# Patient Record
Sex: Female | Born: 1975
Health system: Southern US, Community
[De-identification: ages and names within clinical notes are randomized; demographics above are authoritative.]

## PROBLEM LIST (undated history)

## (undated) DIAGNOSIS — N951 Menopausal and female climacteric states: Secondary | ICD-10-CM

## (undated) DIAGNOSIS — F419 Anxiety disorder, unspecified: Secondary | ICD-10-CM

## (undated) DIAGNOSIS — F329 Major depressive disorder, single episode, unspecified: Secondary | ICD-10-CM

## (undated) DIAGNOSIS — T7840XA Allergy, unspecified, initial encounter: Secondary | ICD-10-CM

## (undated) DIAGNOSIS — E559 Vitamin D deficiency, unspecified: Secondary | ICD-10-CM

## (undated) DIAGNOSIS — F32A Depression, unspecified: Secondary | ICD-10-CM

## (undated) DIAGNOSIS — N921 Excessive and frequent menstruation with irregular cycle: Secondary | ICD-10-CM

## (undated) HISTORY — DX: Anxiety disorder, unspecified: F41.9

## (undated) HISTORY — DX: Depression, unspecified: F32.A

## (undated) HISTORY — DX: Major depressive disorder, single episode, unspecified: F32.9

## (undated) HISTORY — DX: Morbid (severe) obesity due to excess calories: E66.01

## (undated) HISTORY — PX: BRAIN SURGERY: SHX531

## (undated) HISTORY — DX: Vitamin D deficiency, unspecified: E55.9

## (undated) HISTORY — DX: Allergy, unspecified, initial encounter: T78.40XA

## (undated) HISTORY — DX: Excessive and frequent menstruation with irregular cycle: N92.1

## (undated) HISTORY — DX: Menopausal and female climacteric states: N95.1

---

## 2005-06-12 ENCOUNTER — Emergency Department: Payer: Self-pay | Admitting: Emergency Medicine

## 2006-08-26 ENCOUNTER — Other Ambulatory Visit: Admission: RE | Admit: 2006-08-26 | Discharge: 2006-08-26 | Payer: Self-pay | Admitting: Family Medicine

## 2007-08-27 ENCOUNTER — Other Ambulatory Visit: Admission: RE | Admit: 2007-08-27 | Discharge: 2007-08-27 | Payer: Self-pay | Admitting: Family Medicine

## 2008-11-15 ENCOUNTER — Other Ambulatory Visit: Admission: RE | Admit: 2008-11-15 | Discharge: 2008-11-15 | Payer: Self-pay | Admitting: Obstetrics and Gynecology

## 2009-06-04 ENCOUNTER — Inpatient Hospital Stay (HOSPITAL_COMMUNITY): Admission: AD | Admit: 2009-06-04 | Discharge: 2009-06-06 | Payer: Self-pay | Admitting: Obstetrics and Gynecology

## 2009-11-23 ENCOUNTER — Other Ambulatory Visit: Admission: RE | Admit: 2009-11-23 | Discharge: 2009-11-23 | Payer: Self-pay | Admitting: Obstetrics and Gynecology

## 2010-06-01 ENCOUNTER — Emergency Department: Payer: Self-pay | Admitting: Internal Medicine

## 2010-11-21 ENCOUNTER — Other Ambulatory Visit
Admission: RE | Admit: 2010-11-21 | Discharge: 2010-11-21 | Payer: Self-pay | Source: Home / Self Care | Admitting: Obstetrics and Gynecology

## 2011-03-31 LAB — CBC
HCT: 38.4 % (ref 36.0–46.0)
Hemoglobin: 12.1 g/dL (ref 12.0–15.0)
MCHC: 34.5 g/dL (ref 30.0–36.0)
MCV: 83.6 fL (ref 78.0–100.0)
Platelets: 248 10*3/uL (ref 150–400)
Platelets: 268 10*3/uL (ref 150–400)
RDW: 15.2 % (ref 11.5–15.5)
RDW: 15.8 % — ABNORMAL HIGH (ref 11.5–15.5)
WBC: 10.6 10*3/uL — ABNORMAL HIGH (ref 4.0–10.5)

## 2011-03-31 LAB — RPR: RPR Ser Ql: NONREACTIVE

## 2011-03-31 LAB — CCBB MATERNAL DONOR DRAW

## 2012-09-10 ENCOUNTER — Other Ambulatory Visit (HOSPITAL_COMMUNITY)
Admission: RE | Admit: 2012-09-10 | Discharge: 2012-09-10 | Disposition: A | Discharge status: Home / Self Care | Payer: 59 | Source: Ambulatory Visit | Attending: Family Medicine | Admitting: Family Medicine

## 2012-09-10 ENCOUNTER — Other Ambulatory Visit: Payer: Self-pay | Admitting: Physician Assistant

## 2012-09-10 DIAGNOSIS — Z124 Encounter for screening for malignant neoplasm of cervix: Secondary | ICD-10-CM | POA: Insufficient documentation

## 2012-11-21 LAB — HM PAP SMEAR: HM PAP: NORMAL

## 2014-01-23 LAB — HEMOGLOBIN A1C: Hgb A1c MFr Bld: 5.8 % (ref 4.0–6.0)

## 2014-01-23 LAB — LIPID PANEL
Cholesterol: 208 mg/dL — AB (ref 0–200)
HDL: 78 mg/dL — AB (ref 35–70)
LDL CALC: 114 mg/dL
Triglycerides: 81 mg/dL (ref 40–160)

## 2014-03-21 ENCOUNTER — Ambulatory Visit: Payer: Self-pay | Admitting: Family Medicine

## 2014-03-22 ENCOUNTER — Ambulatory Visit: Payer: Self-pay | Admitting: Family Medicine

## 2015-05-31 ENCOUNTER — Telehealth: Payer: Self-pay | Admitting: Family Medicine

## 2015-05-31 ENCOUNTER — Other Ambulatory Visit: Payer: Self-pay

## 2015-05-31 DIAGNOSIS — F419 Anxiety disorder, unspecified: Principal | ICD-10-CM

## 2015-05-31 DIAGNOSIS — F32A Depression, unspecified: Secondary | ICD-10-CM

## 2015-05-31 DIAGNOSIS — F329 Major depressive disorder, single episode, unspecified: Secondary | ICD-10-CM

## 2015-05-31 MED ORDER — BUPROPION HCL ER (XL) 300 MG PO TB24: 300.0000 mg | ORAL_TABLET | Freq: Every day | ORAL | Status: DC

## 2015-05-31 NOTE — Telephone Encounter (Signed)
Patient stated that she called last week requesting a medication refill since she was getting low, but she never heard from anyone and now she is completely out.

## 2015-06-01 NOTE — Telephone Encounter (Signed)
OPENED IN ERROR

## 2015-07-16 ENCOUNTER — Encounter: Payer: Self-pay | Admitting: Family Medicine

## 2015-07-16 ENCOUNTER — Ambulatory Visit (INDEPENDENT_AMBULATORY_CARE_PROVIDER_SITE_OTHER): Payer: 59 | Admitting: Family Medicine

## 2015-07-16 ENCOUNTER — Encounter (INDEPENDENT_AMBULATORY_CARE_PROVIDER_SITE_OTHER): Payer: Self-pay

## 2015-07-16 VITALS — BP 138/76 | HR 102 | Temp 98.0°F | Resp 18 | Ht 69.0 in | Wt 272.2 lb

## 2015-07-16 DIAGNOSIS — E66811 Obesity, class 1: Secondary | ICD-10-CM | POA: Insufficient documentation

## 2015-07-16 DIAGNOSIS — E559 Vitamin D deficiency, unspecified: Secondary | ICD-10-CM

## 2015-07-16 DIAGNOSIS — F3340 Major depressive disorder, recurrent, in remission, unspecified: Secondary | ICD-10-CM

## 2015-07-16 DIAGNOSIS — Z3041 Encounter for surveillance of contraceptive pills: Secondary | ICD-10-CM

## 2015-07-16 DIAGNOSIS — Z1239 Encounter for other screening for malignant neoplasm of breast: Secondary | ICD-10-CM

## 2015-07-16 DIAGNOSIS — E66813 Obesity, class 3: Secondary | ICD-10-CM

## 2015-07-16 DIAGNOSIS — Z0000 Encounter for general adult medical examination without abnormal findings: Secondary | ICD-10-CM | POA: Insufficient documentation

## 2015-07-16 DIAGNOSIS — E669 Obesity, unspecified: Secondary | ICD-10-CM | POA: Insufficient documentation

## 2015-07-16 HISTORY — DX: Morbid (severe) obesity due to excess calories: E66.01

## 2015-07-16 HISTORY — DX: Obesity, class 3: E66.813

## 2015-07-16 MED ORDER — BUPROPION HCL ER (XL) 300 MG PO TB24: 300.0000 mg | ORAL_TABLET | Freq: Every day | ORAL | Status: DC

## 2015-07-16 MED ORDER — FLUOXETINE HCL 20 MG PO TABS: 20.0000 mg | ORAL_TABLET | Freq: Every day | ORAL | Status: DC

## 2015-07-16 NOTE — Patient Instructions (Signed)
Menopause and Herbal Products Menopause is the normal time of life when menstrual periods stop completely. Menopause is complete when you have missed 12 consecutive menstrual periods. It usually occurs between the ages of 40 to 85, with an average age of 31. Very rarely does a woman develop menopause before 40 years old. At menopause, your ovaries stop producing the female hormones, estrogen and progesterone. This can cause undesirable symptoms and also affect your health. Sometimes the symptoms can occur 4 to 5 years before the menopause begins. There is no relationship between menopause and:  Oral contraceptives.  Number of children you had.  Race.  The age your menstrual periods started (menarche). Heavy smokers and very thin women may develop menopause earlier in life. Estrogen and progesterone hormone treatment is the usual method of treating menopausal symptoms. However, there are women who should not take hormone treatment. This is true of:   Women that have breast or uterine cancer.  Women who prefer not to take hormones because of certain side effects (abnormal uterine bleeding).  Women who are afraid that hormones may cause breast cancer.  Women who have a history of liver disease, heart disease, stroke, or blood clots. For these women, there are other medications that may help treat their menopausal symptoms. These medications are found in plants and botanical products. They can be found in the form of herbs, teas, oils, tinctures, and pills.  CAUSES:  The ovaries stop producing the female hormones estrogen and progesterone.  Other causes include:  Surgery to remove both ovaries.  The ovaries stop functioning for no know reason.  Tumors of the pituitary gland in the brain.  Medical disease that affects the ovaries and hormone production.  Radiation treatment to the abdomen or pelvis.  Chemotherapy that affects the ovaries. PHYTOESTROGENS: Phytoestrogens occur  naturally in plants and plant products. They act like estrogen in the body. Herbal medications are made from these plants and botanical steroids. There are 3 types of phytoestrogens:  Isoflavones (genistein and daidzein) are found in soy, garbanzo beans, miso and tofu foods.  Ligins are found in the shell of seeds. They are used to make oils like flaxseed oil. The bacteria in your intestine act on these foods to produce the estrogen-like hormones.  Coumestans are estrogen-like. Some of the foods they are found in include sunflower seeds and bean sprouts. CONDITIONS AND THEIR POSSIBLE HERBAL TREATMENT:  Hot flashes and night sweats.  Soy, black cohosh and evening primrose.  Irritability, insomnia, depression and memory problems.  Chasteberry, ginseng, and soy.  St. John's wort may be helpful for depression. However, there is a concern of it causing cataracts of the eye and may have bad effects on other medications. St. John's wort should not be taken for long time and without your caregiver's advice.  Loss of libido and vaginal and skin dryness.  Wild yam and soy.  Prevention of coronary heart disease and osteoporosis.  Soy and Isoflavones. Several studies have shown that some women benefit from herbal medications, but most of the studies have not consistently shown that these supplements are much better than placebo. Other forms of treatment to help women with menopausal symptoms include a balanced diet, rest, exercise, vitamin and calcium (with vitamin D) supplements, acupuncture, and group therapy when necessary. THOSE WHO SHOULD NOT TAKE HERBAL MEDICATIONS INCLUDE:  Women who are planning on getting pregnant unless told by your caregiver.  Women who are breastfeeding unless told by your caregiver.  Women who are taking other  prescription medications unless told by your caregiver.  Infants, children, and elderly women unless told by your caregiver. Different herbal medications  have different and unmeasured amounts of the herbal ingredients. There are no regulations, quality control, and standardization of the ingredients in herbal medications. Therefore, the amount of the ingredient in the medication may vary from one herb, pill, tea, oil or tincture to another. Many herbal medications can cause serious problems and can even have poisonous effects if taken too much or too long. If problems develop, the medication should be stopped and recorded by your caregiver. HOME CARE INSTRUCTIONS  Do not take or give children herbal medications without your caregiver's advice.  Let your caregiver know all the medications you are taking. This includes prescription, over-the-counter, eye drops, and creams.  Do not take herbal medications longer or more than recommended.  Tell your caregiver about any side effects from the medication. SEEK MEDICAL CARE IF:  You develop a fever of 102 F (38.9 C), or as directed by your caregiver.  You feel sick to your stomach (nauseous), vomit, or have diarrhea.  You develop a rash.  You develop abdominal pain.  You develop severe headaches.  You start to have vision problems.  You feel dizzy or faint.  You start to feel numbness in any part of your body.  You start shaking (have convulsions). Document Released: 05/26/2008 Document Revised: 11/24/2012 Document Reviewed: 12/24/2010 [facility] Patient Information 2015 [place], [place]. This information is not intended to replace advice given to you by your health care provider. Make sure you discuss any questions you have with your health care provider.

## 2015-07-16 NOTE — Progress Notes (Signed)
Name: Natalie Petersen   MRN: [id number]    DOB: 1976/05/24   Date:07/16/2015       Progress Note  Subjective  Chief Complaint  Chief Complaint  Patient presents with  . Annual Exam    HPI  Patient is here today for a Complete Female Physical Exam:  The patient has has no unusual complaints and complains of worsening irritability, hot flashes, spotting menses. When she first started Wellbutrin she felt energized, even tempered and elevated mood but now after 5 years on that medication she feels as if her symptoms are not well controled. She does not mind the intermenstrual spotting but would like to control her snappy mood better. Overall feels healthy. Diet is well balanced. In general does not exercise regularly. Sees dentist regularly and addresses vision concerns with ophthalmologist if applicable. In regards to sexual activity the patient is currently sexually active. Currently is not concerned about exposure to any STDs.   Menstrual history is irregular periods with spotting in between.    Past Medical History  Diagnosis Date  . Allergy     seasonal  . Depression   . Vitamin D deficiency   . Menopausal symptoms   . Metrorrhagia     Past Surgical History  Procedure Laterality Date  . Brain surgery      Family History  Problem Relation Age of Onset  . Heart murmur Mother   . Arthritis Father     RA  . Hyperlipidemia Father   . Cancer Maternal Aunt     ovarian and uterine   . Stroke Paternal Uncle   . Arthritis Maternal Grandmother   . Heart attack Maternal Grandfather   . Stroke Paternal Grandmother   . Arthritis Paternal Grandmother     History   Social History  . Marital Status: Married    Spouse Name: N/A  . Number of Children: N/A  . Years of Education: N/A   Occupational History  . Not on file.   Social History Main Topics  . Smoking status: Never Smoker   . Smokeless tobacco: Never Used  . Alcohol Use: 0.0 oz/week    0 Standard drinks or  equivalent per week     Comment: occasionally red wine  . Drug Use: No  . Sexual Activity:    Partners: Male   Other Topics Concern  . Not on file   Social History Narrative  . No narrative on file     Current outpatient prescriptions:  .  buPROPion (WELLBUTRIN XL) 300 MG 24 hr tablet, Take 1 tablet (300 mg total) by mouth daily., Disp: 30 tablet, Rfl: 0 .  CYCLAFEM 1/35 tablet, Take 1 tablet by mouth daily., Disp: , Rfl:   Allergies  Allergen Reactions  . Amoxicillin Hives and Swelling  . Benadryl [Diphenhydramine Hcl (Sleep)] Hives and Swelling    ROS  CONSTITUTIONAL: No significant weight changes, fever, chills, weakness or fatigue.  HEENT:  - Eyes: No visual changes.  - Ears: No auditory changes. No pain.  - Nose: No sneezing, congestion, runny nose. - Throat: No sore throat. No changes in swallowing. SKIN: No rash or itching.  CARDIOVASCULAR: No chest pain, chest pressure or chest discomfort. No palpitations or edema.  RESPIRATORY: No shortness of breath, cough or sputum.  GASTROINTESTINAL: No anorexia, nausea, vomiting. No changes in bowel habits. No abdominal pain or blood.  GENITOURINARY: No dysuria. No frequency. No discharge.  NEUROLOGICAL: No headache, dizziness, syncope, paralysis, ataxia, numbness or tingling in the  extremities. No memory changes. No change in bowel or bladder control.  MUSCULOSKELETAL: No joint pain. No muscle pain. HEMATOLOGIC: No anemia, bleeding or bruising.  LYMPHATICS: No enlarged lymph nodes.  PSYCHIATRIC: Yes change in mood. No change in sleep pattern.  ENDOCRINOLOGIC: No reports of sweating, cold or heat intolerance. No polyuria or polydipsia.   Objective  Filed Vitals:   07/16/15 0835  BP: 138/76  Pulse: 102  Temp: 98 F (36.7 C)  TempSrc: Oral  Resp: 18  Height: 5' 9"  (1.753 m)  Weight: 272 lb 3.2 oz (123.469 kg)  SpO2: 96%   Body mass index is 40.18 kg/(m^2).  Depression screen PHQ 2/9 07/16/2015  Decreased Interest  0  Down, Depressed, Hopeless 0  PHQ - 2 Score 0     Physical Exam  Constitutional: Patient appears well-developed and well-nourished. In no distress.  HEENT:  - Head: Normocephalic and atraumatic.  - Ears: Bilateral TMs gray, no erythema or effusion - Nose: Nasal mucosa moist - Mouth/Throat: Oropharynx is clear and moist. No tonsillar hypertrophy or erythema. No post nasal drainage.  - Eyes: Conjunctivae clear, EOM movements normal. PERRLA. No scleral icterus.  Neck: Normal range of motion. Neck supple. No JVD present. No thyromegaly present.  Cardiovascular: Normal rate, regular rhythm and normal heart sounds.  No murmur heard.  Pulmonary/Chest: Effort normal and breath sounds normal. No respiratory distress. Abdominal: Soft. Bowel sounds are normal, no distension. There is no tenderness. no masses BREAST: Bilateral breast exam large pendulous breast tissue normal with no masses, skin changes or nipple discharge FEMALE GENITALIA: Defered RECTAL: Defered Musculoskeletal: Normal range of motion bilateral UE and LE, no joint effusions. Peripheral vascular: Bilateral LE no edema. Neurological: CN II-XII grossly intact with no focal deficits. Alert and oriented to person, place, and time. Coordination, balance, strength, speech and gait are normal.  Skin: Skin is warm and dry. No rash noted. No erythema.  Psychiatric: Patient has a normal mood and affect. Behavior is normal in office today. Judgment and thought content normal in office today.   Assessment & Plan  1. Annual physical exam PAP smear done 01/31/14 normal findings, repeat next testing in 2018. Ordered first screening mammogram, may take NSAID before to reduce discomfort. Offered routine lab work, done last in 2015, she declined.   2. Obesity, Class III, BMI 40-49.9 (morbid obesity) The patient has been counseled on their higher than normal BMI.  They have verbally expressed understanding their increased risk for other  diseases.  In efforts to meet a better target BMI goal the patient has been counseled on lifestyle, diet and exercise modification tactics. Start with moderate intensity aerobic exercise (walking, jogging, elliptical, swimming, group or individual sports, hiking) at least 12mns a day at least 4 days a week and increase intensity, duration, frequency as tolerated. Diet should include well balance fresh fruits and vegetables avoiding processed foods, carbohydrates and sugars. Drink at least 8oz 10 glasses a day avoiding sodas, sugary fruit drinks, sweetened tea. Check weight on a reliable scale daily and monitor weight loss progress daily. Consider investing in mobile phone apps that will help keep track of weight loss goals.   3. Depression, major, recurrent, in remission Continue Wellbutrin 3063mXL daily and add on SSRI Fluoxetine. The patient has been counseled on the proper use, side effects and potential interactions of the new medication. Patient encouraged to review the side effects and safety profile pamphlet provided with the prescription from the pharmacy as well as request counseling from  the pharmacy team as needed.   - FLUoxetine (PROZAC) 20 MG tablet; Take 1 tablet (20 mg total) by mouth daily.  Dispense: 30 tablet; Refill: 1 - buPROPion (WELLBUTRIN XL) 300 MG 24 hr tablet; Take 1 tablet (300 mg total) by mouth daily.  Dispense: 90 tablet; Refill: 3  4. Vitamin D deficiency May be contributing to mood changes and fatigue.   5. Oral contraceptive pill surveillance Doing well on Cyclafem OCPs.  6. Breast cancer screening  - MM Digital Screening; Future

## 2015-07-22 ENCOUNTER — Encounter: Payer: Self-pay | Admitting: Family Medicine

## 2015-07-24 ENCOUNTER — Other Ambulatory Visit: Payer: Self-pay | Admitting: Family Medicine

## 2015-07-24 DIAGNOSIS — F3340 Major depressive disorder, recurrent, in remission, unspecified: Secondary | ICD-10-CM

## 2015-07-24 MED ORDER — SERTRALINE HCL 25 MG PO TABS: 25.0000 mg | ORAL_TABLET | Freq: Every day | ORAL | Status: DC

## 2015-08-21 ENCOUNTER — Other Ambulatory Visit: Payer: Self-pay | Admitting: Family Medicine

## 2015-08-21 DIAGNOSIS — F3340 Major depressive disorder, recurrent, in remission, unspecified: Secondary | ICD-10-CM

## 2015-08-22 ENCOUNTER — Other Ambulatory Visit: Payer: Self-pay | Admitting: Family Medicine

## 2015-08-22 DIAGNOSIS — F3340 Major depressive disorder, recurrent, in remission, unspecified: Secondary | ICD-10-CM

## 2015-08-22 MED ORDER — SERTRALINE HCL 25 MG PO TABS: 25.0000 mg | ORAL_TABLET | Freq: Every day | ORAL | Status: DC

## 2015-08-29 ENCOUNTER — Encounter: Payer: Self-pay | Admitting: Family Medicine

## 2015-09-04 ENCOUNTER — Other Ambulatory Visit: Payer: Self-pay | Admitting: Family Medicine

## 2015-09-04 ENCOUNTER — Ambulatory Visit
Admission: RE | Admit: 2015-09-04 | Discharge: 2015-09-04 | Disposition: A | Discharge status: Home / Self Care | Payer: 59 | Source: Ambulatory Visit | Attending: Family Medicine | Admitting: Family Medicine

## 2015-09-04 DIAGNOSIS — Z1231 Encounter for screening mammogram for malignant neoplasm of breast: Secondary | ICD-10-CM | POA: Insufficient documentation

## 2015-09-04 DIAGNOSIS — R928 Other abnormal and inconclusive findings on diagnostic imaging of breast: Secondary | ICD-10-CM

## 2015-09-04 DIAGNOSIS — Z1239 Encounter for other screening for malignant neoplasm of breast: Secondary | ICD-10-CM

## 2015-09-04 DIAGNOSIS — N63 Unspecified lump in unspecified breast: Secondary | ICD-10-CM

## 2015-09-04 HISTORY — DX: Other abnormal and inconclusive findings on diagnostic imaging of breast: R92.8

## 2015-09-05 ENCOUNTER — Encounter: Payer: Self-pay | Admitting: Family Medicine

## 2015-09-07 ENCOUNTER — Ambulatory Visit
Admission: RE | Admit: 2015-09-07 | Discharge: 2015-09-07 | Disposition: A | Discharge status: Home / Self Care | Payer: 59 | Source: Ambulatory Visit | Attending: Family Medicine | Admitting: Family Medicine

## 2015-09-07 DIAGNOSIS — N63 Unspecified lump in unspecified breast: Secondary | ICD-10-CM

## 2015-09-07 DIAGNOSIS — R928 Other abnormal and inconclusive findings on diagnostic imaging of breast: Secondary | ICD-10-CM

## 2015-09-21 ENCOUNTER — Other Ambulatory Visit: Payer: Self-pay | Admitting: Family Medicine

## 2015-11-07 ENCOUNTER — Encounter: Payer: Self-pay | Admitting: Family Medicine

## 2015-11-08 ENCOUNTER — Emergency Department
Admission: EM | Admit: 2015-11-08 | Discharge: 2015-11-08 | Disposition: A | Discharge status: Home / Self Care | Payer: 59 | Attending: Student | Admitting: Student

## 2015-11-08 ENCOUNTER — Encounter: Payer: Self-pay | Admitting: Emergency Medicine

## 2015-11-08 DIAGNOSIS — F329 Major depressive disorder, single episode, unspecified: Secondary | ICD-10-CM | POA: Diagnosis not present

## 2015-11-08 DIAGNOSIS — R300 Dysuria: Secondary | ICD-10-CM | POA: Diagnosis present

## 2015-11-08 DIAGNOSIS — Z880 Allergy status to penicillin: Secondary | ICD-10-CM | POA: Insufficient documentation

## 2015-11-08 DIAGNOSIS — N898 Other specified noninflammatory disorders of vagina: Secondary | ICD-10-CM

## 2015-11-08 DIAGNOSIS — R030 Elevated blood-pressure reading, without diagnosis of hypertension: Secondary | ICD-10-CM | POA: Insufficient documentation

## 2015-11-08 DIAGNOSIS — Z79899 Other long term (current) drug therapy: Secondary | ICD-10-CM | POA: Insufficient documentation

## 2015-11-08 LAB — WET PREP, GENITAL
Clue Cells Wet Prep HPF POC: NONE SEEN
SPERM: NONE SEEN
Trich, Wet Prep: NONE SEEN
Yeast Wet Prep HPF POC: NONE SEEN

## 2015-11-08 LAB — URINALYSIS COMPLETE WITH MICROSCOPIC (ARMC ONLY)
Bilirubin Urine: NEGATIVE
Glucose, UA: NEGATIVE mg/dL
Nitrite: NEGATIVE
PH: 5 (ref 5.0–8.0)
PROTEIN: NEGATIVE mg/dL
SPECIFIC GRAVITY, URINE: 1.026 (ref 1.005–1.030)

## 2015-11-08 LAB — CHLAMYDIA/NGC RT PCR (ARMC ONLY)
Chlamydia Tr: NOT DETECTED
N GONORRHOEAE: NOT DETECTED

## 2015-11-08 MED ORDER — HYDROXYZINE HCL 50 MG PO TABS
50.0000 mg | ORAL_TABLET | Freq: Once | ORAL | Status: AC
  Administered 2015-11-08: 50 mg via ORAL
  Filled 2015-11-08: qty 1

## 2015-11-08 NOTE — ED Provider Notes (Signed)
[facility] Emergency Department Provider Note  ____________________________________________  Time seen: Approximately 6:51 PM  I have reviewed the triage vital signs and the nursing notes.   HISTORY  Chief Complaint Dysuria    HPI Natalie Petersen is a 39 y.o. female patient complaining of dysuria for 3 days. Patient stated this urgency and urinary frequency. Patient states she is unsure whether my medical spot of blood in the urine. Patient states she's develops flank pain today. He denies any vaginal discharge or fever. Patient is rating her pain discomfort as 2/10.   Past Medical History  Diagnosis Date  . Allergy     seasonal  . Depression   . Vitamin D deficiency   . Menopausal symptoms   . Metrorrhagia     Patient Active Problem List   Diagnosis Date Noted  . Abnormal mammogram of left breast 09/04/2015  . Obesity, Class III, BMI 40-49.9 (morbid obesity) ([place]) 07/16/2015  . Depression, major, recurrent, in remission ([place]) 07/16/2015  . Vitamin D deficiency 07/16/2015  . Oral contraceptive pill surveillance 07/16/2015  . Annual physical exam 07/16/2015  . Breast cancer screening 07/16/2015    Past Surgical History  Procedure Laterality Date  . Brain surgery      Current Outpatient Rx  Name  Route  Sig  Dispense  Refill  . buPROPion (WELLBUTRIN XL) 300 MG 24 hr tablet   Oral   Take 1 tablet (300 mg total) by mouth daily.   90 tablet   3   . PIRMELLA 1/35 tablet      TAKE 1 TABLET DAILY   84 tablet   2   . sertraline (ZOLOFT) 25 MG tablet   Oral   Take 1 tablet (25 mg total) by mouth daily.   30 tablet   0     Discontinue Prozac previously prescribed     Allergies Amoxicillin and Benadryl  Family History  Problem Relation Age of Onset  . Heart murmur Mother   . Arthritis Father     RA  . Hyperlipidemia Father   . Cancer Maternal Aunt     ovarian and uterine   . Stroke Paternal Uncle   . Arthritis Maternal  Grandmother   . Heart attack Maternal Grandfather   . Stroke Paternal Grandmother   . Arthritis Paternal Grandmother     Social History Social History  Substance Use Topics  . Smoking status: Never Smoker   . Smokeless tobacco: Never Used  . Alcohol Use: 0.0 oz/week    0 Standard drinks or equivalent per week     Comment: occasionally red wine    Review of Systems Constitutional: No fever/chills Eyes: No visual changes. ENT: No sore throat. Cardiovascular: Denies chest pain. Respiratory: Denies shortness of breath. Gastrointestinal: No abdominal pain.  No nausea, no vomiting.  No diarrhea.  No constipation. Genitourinary: Negative for dysuria. Musculoskeletal: Negative for back pain. Skin: Negative for rash. Neurological: Negative for headaches, focal weakness or numbness. Psychiatric:Depression Allergic/Immunilogical: See medication list  10-point ROS otherwise negative.  ____________________________________________   PHYSICAL EXAM:  VITAL SIGNS: ED Triage Vitals  Enc Vitals Group     BP 11/08/15 1845 188/1 mmHg     Pulse Rate 11/08/15 1845 93     Resp --      Temp 11/08/15 1845 98.4 F (36.9 C)     Temp Source 11/08/15 1845 Oral     SpO2 11/08/15 1845 96 %     Weight --  Height 11/08/15 1845 5' 9"  (1.753 m)     Head Cir --      Peak Flow --      Pain Score 11/08/15 1847 2     Pain Loc --      Pain Edu? --      Excl. in [place]? --     Constitutional: Alert and oriented. Well appearing and in no acute distress. Eyes: Conjunctivae are normal. PERRL. EOMI. Head: Atraumatic. Nose: No congestion/rhinnorhea. Mouth/Throat: Mucous membranes are moist.  Oropharynx non-erythematous. Neck: No stridor. No cervical spine tenderness to palpation. Hematological/Lymphatic/Immunilogical: No cervical lymphadenopathy. Cardiovascular: Normal rate, regular rhythm. Grossly normal heart sounds.  Good peripheral circulation. Elevated blood pressure Respiratory: Normal  respiratory effort.  No retractions. Lungs CTAB. Gastrointestinal: Soft and nontender. No distention. No abdominal bruits. No CVA tenderness. Musculoskeletal: No lower extremity tenderness nor edema.  No joint effusions. Neurologic:  Normal speech and language. No gross focal neurologic deficits are appreciated. No gait instability. Skin:  Skin is warm, dry and intact. No rash noted. Psychiatric: Mood and affect are normal. Speech and behavior are normal.  ____________________________________________   LABS (all labs ordered are listed, but only abnormal results are displayed)  Labs Reviewed  WET PREP, GENITAL - Abnormal; Notable for the following:    WBC, Wet Prep HPF POC MANY (*)    All other components within normal limits  URINALYSIS COMPLETEWITH MICROSCOPIC ([facility] ONLY) - Abnormal; Notable for the following:    Color, Urine YELLOW (*)    APPearance HAZY (*)    Ketones, ur TRACE (*)    Hgb urine dipstick 1+ (*)    Leukocytes, UA 1+ (*)    Bacteria, UA RARE (*)    Squamous Epithelial / LPF 6-30 (*)    All other components within normal limits  CHLAMYDIA/NGC RT PCR ([facility] ONLY)   ____________________________________________  EKG   ____________________________________________  RADIOLOGY   ____________________________________________   PROCEDURES  Procedure(s) performed: None  Critical Care performed: No  ____________________________________________   INITIAL IMPRESSION / ASSESSMENT AND PLAN / ED COURSE  Pertinent labs & imaging results that were available during my care of the patient were reviewed by me and considered in my medical decision making (see chart for details). Chaperoned pelvic exam reveals a copious amount of discharge in the vaginal canal. Wet prep was negative for clue cells and Trichomonas. But a moderate amount of white blood cells were found. Chlamydia and gonorrhea is pending. Patient states she has to leave and cannot wait for the [facility]  chlamydia results. Patient S/P advised telephonically of the results. Status post vascular exam patient exhibits flushing and erythema face neck and trunk. Patient was given Atarax while waiting lab results and she states that the flushing sensation is receding. ____________________________________________   FINAL CLINICAL IMPRESSION(S) / ED DIAGNOSES  Final diagnoses:  Vaginal discharge      Sable Feil, PA-C 11/08/15 2123  Joanne Gavel, MD 11/09/15 [phone]

## 2015-11-08 NOTE — Discharge Instructions (Signed)
Patient will be contacted telephonically of labor results.

## 2015-11-08 NOTE — ED Notes (Signed)
Pt presents with dysuria for three days. Denies fever. Denies nausea, vomiting or diarrhea. Pt reports increase in frequency in urination. States she has seen a spot of blood in urine. Pt states today low back pain began.

## 2015-11-09 ENCOUNTER — Other Ambulatory Visit: Payer: Self-pay | Admitting: Family Medicine

## 2015-11-09 MED ORDER — NITROFURANTOIN MONOHYD MACRO 100 MG PO CAPS: 100.0000 mg | ORAL_CAPSULE | Freq: Two times a day (BID) | ORAL | Status: DC

## 2015-11-13 ENCOUNTER — Ambulatory Visit: Payer: 59 | Admitting: Family Medicine

## 2016-03-11 ENCOUNTER — Telehealth: Payer: Self-pay | Admitting: Family Medicine

## 2016-03-11 NOTE — Telephone Encounter (Signed)
Dr Nadine Counts Patient: Patient had regular mammogram and had to go back to do a diagnostic one. It is now time to do her 6 month follow up mammogram. Patient is requesting a referral to be sent to [facility] requesting morning appointment.

## 2016-03-12 ENCOUNTER — Other Ambulatory Visit: Payer: Self-pay

## 2016-03-12 DIAGNOSIS — N6320 Unspecified lump in the left breast, unspecified quadrant: Secondary | ICD-10-CM

## 2016-03-12 NOTE — Telephone Encounter (Signed)
Mammogram setup for April 11 @ 9:20

## 2016-04-01 ENCOUNTER — Ambulatory Visit: Payer: 59

## 2016-04-01 ENCOUNTER — Other Ambulatory Visit: Payer: 59

## 2016-04-08 ENCOUNTER — Ambulatory Visit
Admission: RE | Admit: 2016-04-08 | Discharge: 2016-04-08 | Disposition: A | Discharge status: Home / Self Care | Payer: 59 | Source: Ambulatory Visit | Attending: Family Medicine | Admitting: Family Medicine

## 2016-04-08 ENCOUNTER — Other Ambulatory Visit: Payer: Self-pay | Admitting: Family Medicine

## 2016-04-08 DIAGNOSIS — N6320 Unspecified lump in the left breast, unspecified quadrant: Secondary | ICD-10-CM

## 2016-04-08 DIAGNOSIS — N63 Unspecified lump in breast: Secondary | ICD-10-CM | POA: Insufficient documentation

## 2016-05-30 ENCOUNTER — Other Ambulatory Visit: Payer: Self-pay

## 2016-05-30 MED ORDER — NORETHINDRONE-ETH ESTRADIOL 1-35 MG-MCG PO TABS: 1.0000 | ORAL_TABLET | Freq: Every day | ORAL | Status: DC

## 2016-05-30 NOTE — Telephone Encounter (Signed)
Last SBP under 140; no mention of migraines; pap UTD; nonsmoker; appt scheduled for 07/18/16; rx approved

## 2016-07-16 ENCOUNTER — Telehealth: Payer: Self-pay | Admitting: Family Medicine

## 2016-07-16 DIAGNOSIS — F3340 Major depressive disorder, recurrent, in remission, unspecified: Secondary | ICD-10-CM

## 2016-07-17 ENCOUNTER — Other Ambulatory Visit: Payer: Self-pay

## 2016-07-17 ENCOUNTER — Encounter: Payer: Self-pay | Admitting: Family Medicine

## 2016-07-17 DIAGNOSIS — F3340 Major depressive disorder, recurrent, in remission, unspecified: Secondary | ICD-10-CM

## 2016-07-17 MED ORDER — BUPROPION HCL ER (XL) 300 MG PO TB24: 300.0000 mg | ORAL_TABLET | Freq: Every day | ORAL | 0 refills | Status: DC

## 2016-07-17 NOTE — Telephone Encounter (Signed)
I refilled the wellbutrin Sertraline is in her med list, not prozac Please contact patient and clarify if she is asking for sertraline or prozac, and if prozac, when did she switch and what is the dose? I'll provide these refills but will need to see her for any additional refills since her last visit was more than a year ago

## 2016-07-17 NOTE — Telephone Encounter (Signed)
I sent the Rx for wellbutrin; see phone note about whether she wants sertraline or prozac

## 2016-07-18 ENCOUNTER — Encounter: Payer: 59 | Admitting: Family Medicine

## 2016-07-18 MED ORDER — SERTRALINE HCL 25 MG PO TABS: 25.0000 mg | ORAL_TABLET | Freq: Every day | ORAL | 0 refills | Status: DC

## 2016-07-18 NOTE — Addendum Note (Signed)
Addended by: LADA, Satira Anis on: 07/18/2016 03:18 PM   Modules accepted: Orders

## 2016-08-11 ENCOUNTER — Telehealth: Payer: Self-pay | Admitting: Family Medicine

## 2016-08-11 ENCOUNTER — Encounter: Payer: Self-pay | Admitting: Family Medicine

## 2016-08-11 ENCOUNTER — Encounter: Payer: Self-pay | Admitting: Emergency Medicine

## 2016-08-11 ENCOUNTER — Emergency Department: Payer: 59

## 2016-08-11 ENCOUNTER — Ambulatory Visit: Payer: 59 | Admitting: Family Medicine

## 2016-08-11 ENCOUNTER — Emergency Department
Admission: EM | Admit: 2016-08-11 | Discharge: 2016-08-12 | Disposition: A | Discharge status: Home / Self Care | Payer: 59 | Attending: Emergency Medicine | Admitting: Emergency Medicine

## 2016-08-11 DIAGNOSIS — R0789 Other chest pain: Secondary | ICD-10-CM | POA: Insufficient documentation

## 2016-08-11 DIAGNOSIS — R079 Chest pain, unspecified: Secondary | ICD-10-CM

## 2016-08-11 LAB — BASIC METABOLIC PANEL
Anion gap: 6 (ref 5–15)
BUN: 12 mg/dL (ref 6–20)
CO2: 25 mmol/L (ref 22–32)
Calcium: 8.8 mg/dL — ABNORMAL LOW (ref 8.9–10.3)
Chloride: 104 mmol/L (ref 101–111)
Creatinine, Ser: 0.68 mg/dL (ref 0.44–1.00)
GFR calc Af Amer: >60 mL/min (ref >60)
GLUCOSE: 106 mg/dL — AB (ref 65–99)
POTASSIUM: 3.8 mmol/L (ref 3.5–5.1)
Sodium: 135 mmol/L (ref 135–145)

## 2016-08-11 LAB — CBC
HEMATOCRIT: 39.7 % (ref 35.0–47.0)
Hemoglobin: 13.4 g/dL (ref 12.0–16.0)
MCH: 27.8 pg (ref 26.0–34.0)
MCHC: 33.8 g/dL (ref 32.0–36.0)
MCV: 82.4 fL (ref 80.0–100.0)
Platelets: 325 10*3/uL (ref 150–440)
RBC: 4.81 MIL/uL (ref 3.80–5.20)
RDW: 14 % (ref 11.5–14.5)
WBC: 8.6 10*3/uL (ref 3.6–11.0)

## 2016-08-11 LAB — TROPONIN I: Troponin I: <0.03 ng/mL (ref <0.03)

## 2016-08-11 NOTE — Telephone Encounter (Signed)
I reviewed [facility] EKG and notes Please call patient immediately Patient needs to go to the ER NOW She had an abnormal EKG with prolonged QTc and needs stat labs This cannot wait for me to see her in a few days She needs to go NOW Her blood pressure was markedly elevated as well

## 2016-08-11 NOTE — ED Provider Notes (Signed)
[facility] ([place]) Emergency Department Provider Note   ____________________________________________   First MD Initiated Contact with Patient 08/11/16 2324     (approximate)  I have reviewed the triage vital signs and the nursing notes.   HISTORY  Chief Complaint Chest Pain   HPI Natalie Petersen is a 40 y.o. female with a history of depression and what could hypertension is presenting to the emergency department today with 2 days of chest pain. Says the chest pain is in the middle of her chest as a 5-6 out of 10 at this time. She describes the pain as sudden onset and constant. Says that it does not worsen with breathing or movement. No coughing or fever at home. Says that she has not had chest pain like this in the past. Was seen by her doctor this morning who sent her in the emergency department for further workup including blood work. She does take an estrogen containing birth control pill no history of blood clots. However, says her mother had a blood clot after she had surgery. No history or family of heart disease. Patient does not smoke. Says she took ibuprofen for pain relief at home but this did not help. Says that she has not any recent heavy lifting except for her son who she says weighs 85 poundsand she has been lifting for quite some time. Does not report any radiation of the pain. Denies any shortness of breath, nausea vomiting or diaphoresis.  Says that she had taken her blood pressure this morning and was 161 systolic.   Past Medical History:  Diagnosis Date  . Allergy    seasonal  . Depression   . Menopausal symptoms   . Metrorrhagia   . Vitamin D deficiency     Patient Active Problem List   Diagnosis Date Noted  . Abnormal mammogram of left breast 09/04/2015  . Obesity, Class III, BMI 40-49.9 (morbid obesity) ([place]) 07/16/2015  . Depression, major, recurrent, in remission ([place]) 07/16/2015  . Vitamin D deficiency 07/16/2015  . Oral  contraceptive pill surveillance 07/16/2015  . Annual physical exam 07/16/2015  . Breast cancer screening 07/16/2015    Past Surgical History:  Procedure Laterality Date  . BRAIN SURGERY      Prior to Admission medications   Medication Sig Start Date End Date Taking? Authorizing Provider  buPROPion (WELLBUTRIN XL) 300 MG 24 hr tablet Take 1 tablet (300 mg total) by mouth daily. 07/17/16   Arnetha Courser, MD  nitrofurantoin, macrocrystal-monohydrate, (MACROBID) 100 MG capsule Take 1 capsule (100 mg total) by mouth 2 (two) times daily. 11/09/15   Bobetta Lime, MD  norethindrone-ethinyl estradiol 1/35 ([place] 1/35) tablet Take 1 tablet by mouth daily. 05/30/16   Arnetha Courser, MD  sertraline (ZOLOFT) 25 MG tablet Take 1 tablet (25 mg total) by mouth daily. 07/18/16   Arnetha Courser, MD    Allergies Amoxicillin and Benadryl [diphenhydramine hcl (sleep)]  Family History  Problem Relation Age of Onset  . Heart murmur Mother   . Arthritis Father     RA  . Hyperlipidemia Father   . Cancer Maternal Aunt     ovarian and uterine   . Arthritis Maternal Grandmother   . Heart attack Maternal Grandfather   . Stroke Paternal Grandmother   . Arthritis Paternal Grandmother   . Breast cancer Paternal Grandmother   . Stroke Paternal Uncle     Social History Social History  Substance Use Topics  . Smoking status: Never  Smoker  . Smokeless tobacco: Never Used  . Alcohol use 0.0 oz/week     Comment: occasionally red wine    Review of Systems Constitutional: No fever/chills Eyes: No visual changes. ENT: No sore throat. Cardiovascular: As above Respiratory: Denies shortness of breath. Gastrointestinal: No abdominal pain.  No nausea, no vomiting.  No diarrhea.  No constipation. Genitourinary: Negative for dysuria. Musculoskeletal: Negative for back pain. Skin: Negative for rash. Neurological: Negative for headaches, focal weakness or numbness.  10-point ROS otherwise  negative.  ____________________________________________   PHYSICAL EXAM:  VITAL SIGNS: ED Triage Vitals  Enc Vitals Group     BP 08/11/16 2031 (!) 174/97     Pulse Rate 08/11/16 2031 90     Resp 08/11/16 2031 20     Temp 08/11/16 2031 98.4 F (36.9 C)     Temp Source 08/11/16 2031 Oral     SpO2 08/11/16 2031 99 %     Weight 08/11/16 2029 280 lb (127 kg)     Height 08/11/16 2029 5' 9"  (1.753 m)     Head Circumference --      Peak Flow --      Pain Score 08/11/16 2029 5     Pain Loc --      Pain Edu? --      Excl. in [place]? --     Constitutional: Alert and oriented. Well appearing and in no acute distress. Eyes: Conjunctivae are normal. PERRL. EOMI. Head: Atraumatic. Nose: No congestion/rhinnorhea. Mouth/Throat: Mucous membranes are moist.  Oropharynx non-erythematous. Neck: No stridor.   Cardiovascular: Normal rate, regular rhythm. Grossly normal heart sounds.  Good peripheral circulation with intact in bilateral radial as well as dorsalis pedis pulses. Respiratory: Normal respiratory effort.  No retractions. Lungs CTAB. Gastrointestinal: Soft and nontender. No distention. No abdominal bruits. No CVA tenderness. Musculoskeletal: No lower extremity tenderness nor edema.  No joint effusions. Neurologic:  Normal speech and language. No gross focal neurologic deficits are appreciated. No gait instability. Skin:  Skin is warm, dry and intact. No rash noted. Psychiatric: Mood and affect are normal. Speech and behavior are normal.  ____________________________________________   LABS (all labs ordered are listed, but only abnormal results are displayed)  Labs Reviewed  BASIC METABOLIC PANEL - Abnormal; Notable for the following:       Result Value   Glucose, Bld 106 (*)    Calcium 8.8 (*)    All other components within normal limits  CBC  TROPONIN I  FIBRIN DERIVATIVES D-DIMER ([facility] ONLY)   ____________________________________________  EKG  ED ECG REPORT I, Doran Stabler, the attending physician, personally viewed and interpreted this ECG.   Date: 08/11/2016  EKG Time: 2032  Rate: 92  Rhythm: normal sinus rhythm  Axis: Left axis deviation  Intervals:none  ST&T Change: No ST segment elevation or depression. No abnormal T-wave inversion.  ____________________________________________  RADIOLOGY  DG Chest 2 View (Accession [id number]) (Order [id number])  Imaging  Date: 08/11/2016 Department: [facility] EMERGENCY DEPARTMENT Released By: Festus Aloe, RN (auto-released) Authorizing: Orbie Pyo, MD  PACS Images   Show images for DG Chest 2 View  Study Result   CLINICAL DATA:  Chest tightness x2 days  EXAM: CHEST  2 VIEW  COMPARISON:  None.  FINDINGS: Lungs are clear.  No pleural effusion or pneumothorax.  The heart is normal in size.  Visualized osseous structures are within normal limits.  IMPRESSION: Normal chest radiographs.   Electronically Signed  By: Julian Hy M.D.   On: 08/11/2016 21:06     ____________________________________________   PROCEDURES  Procedure(s) performed:   Procedures  Critical Care performed:   ____________________________________________   INITIAL IMPRESSION / ASSESSMENT AND PLAN / ED COURSE  Pertinent labs & imaging results that were available during my care of the patient were reviewed by me and considered in my medical decision making (see chart for details).  Patient with 2 days of constant chest pain with very reassuring EKG as well as lab workup. Hypertension likely related to white coat hypertension which the patient says that she knows that she has. Patient does take an estrogen containing birth control pill. Therefore, she will not be able to be appropriately risk stratified by the [facility] rule.  We will evaluate further with a d-dimer.  Clinical Course    ----------------------------------------- 1:07 AM on  08/12/2016 -----------------------------------------  Patient appears comfortable at this time. I updated her about her very reassuring labs including a d-dimer which is within normal limits. She will try salve at home just as Aspercreme or icy hot. She'll be discharged home to follow-up with her primary care doctor. She understands this plan and is willing to comply. Possible chest wall pain. After 2 days of symptoms the patient has a very reassuring workup for emergent pathology. ____________________________________________   FINAL CLINICAL IMPRESSION(S) / ED DIAGNOSES  Chest pain.    NEW MEDICATIONS STARTED DURING THIS VISIT:  New Prescriptions   No medications on file     Note:  This document was prepared using Dragon voice recognition software and may include unintentional dictation errors.    Orbie Pyo, MD 08/12/16 [phone]

## 2016-08-11 NOTE — Telephone Encounter (Signed)
I returned from dinner after sending message to my staff this afternoon; checked home dashboard, did not see pt in ER; called mobile (name and voice ID'd) and left detailed message; I need her to to go to the ER now; abnormal EKG, elevated BP; I'll try another number as well Called 2nd number listed as [email or web address]; left detailed msg as well, explaining need for ER visit, also that her BP was so high, she needs to not take her OCP because of risk of stroke or heart attack and we'll need to come up with another way to take care of contraception

## 2016-08-11 NOTE — ED Notes (Signed)
MD at bedside. 

## 2016-08-11 NOTE — ED Triage Notes (Addendum)
Patient ambulatory to triage with steady gait, without difficulty or distress noted; pt reports chest tightness x 2 days; denies accomp symptoms; denies hx of same; seen at [facility] this am, had EKG, sent to PCP who st to come to ED for labs

## 2016-08-11 NOTE — ED Notes (Signed)
Spoke with lab and D-Dimer is able to be added on to blood in lab.

## 2016-08-12 LAB — FIBRIN DERIVATIVES D-DIMER (ARMC ONLY): FIBRIN DERIVATIVES D-DIMER (ARMC): 399 (ref 0–499)

## 2016-08-12 NOTE — Telephone Encounter (Signed)
Tried to call patient tom make she went to ER I left voicemail, but I do see that she went in chart

## 2016-08-13 ENCOUNTER — Ambulatory Visit: Payer: 59 | Admitting: Family Medicine

## 2016-09-08 ENCOUNTER — Ambulatory Visit: Payer: 59 | Admitting: Family Medicine

## 2016-09-09 ENCOUNTER — Ambulatory Visit (INDEPENDENT_AMBULATORY_CARE_PROVIDER_SITE_OTHER): Payer: 59 | Admitting: Family Medicine

## 2016-09-09 ENCOUNTER — Encounter: Payer: Self-pay | Admitting: Family Medicine

## 2016-09-09 ENCOUNTER — Other Ambulatory Visit: Payer: Self-pay | Admitting: Family Medicine

## 2016-09-09 VITALS — BP 145/82 | HR 91 | Temp 98.3°F | Wt 290.0 lb

## 2016-09-09 DIAGNOSIS — Z0000 Encounter for general adult medical examination without abnormal findings: Secondary | ICD-10-CM

## 2016-09-09 DIAGNOSIS — Z124 Encounter for screening for malignant neoplasm of cervix: Secondary | ICD-10-CM | POA: Diagnosis not present

## 2016-09-09 DIAGNOSIS — Z23 Encounter for immunization: Secondary | ICD-10-CM | POA: Diagnosis not present

## 2016-09-09 DIAGNOSIS — R928 Other abnormal and inconclusive findings on diagnostic imaging of breast: Secondary | ICD-10-CM

## 2016-09-09 NOTE — Progress Notes (Signed)
Patient ID: Natalie Petersen, female   DOB: 10/10/1976, 40 y.o.   MRN: [id number]   Subjective:   Natalie Petersen is a 40 y.o. female here for a complete physical exam  Interim issues since last visit: went to ER for heart issue, chest pain, worked up and it was "nothing" USPSTF grade A and B recommendations Alcohol: 4 drinks a week Depression:  Depression screen [facility] 2/9 09/09/2016 07/16/2015  Decreased Interest 0 0  Down, Depressed, Hopeless 0 0  PHQ - 2 Score 0 0   Hypertension: white coat HTN; checks at home, 128/78 at home Obesity: gaining weight; has had thyroid checked and normal Tobacco use: never smoker HIV, hep B, hep C: not interested STD testing and prevention (chl/gon/syphilis): not interested Lipids: check today Glucose: check today Colorectal cancer: no fam hx Breast cancer: has gone to [place], twice, going every six months BRCA gene screening: grandmother had ovarian, no others Intimate partner violence: no Cervical cancer screening: pap today; no abnormals Lung cancer: n/a Osteoporosis: n/a Fall prevention/vitamin D: outside, 1000 iu vit D daily but not more  AAA: na/ Aspirin: n/a Diet: tries to be a good eater, low on breads, eats late, drinks enough water, no soda or juice Exercise: a little Skin cancer: no worrisome places  Past Medical History:  Diagnosis Date  . Allergy    seasonal  . Depression   . Menopausal symptoms   . Metrorrhagia   . Vitamin D deficiency    Past Surgical History:  Procedure Laterality Date  . BRAIN SURGERY     Family History  Problem Relation Age of Onset  . Heart murmur Mother   . Arthritis Father     RA  . Hyperlipidemia Father   . Cancer Maternal Aunt     ovarian and uterine   . Arthritis Maternal Grandmother   . Heart attack Maternal Grandfather   . Stroke Paternal Grandmother   . Arthritis Paternal Grandmother   . Breast cancer Paternal Grandmother   . Stroke Paternal Uncle    Social History  Substance Use  Topics  . Smoking status: Never Smoker  . Smokeless tobacco: Never Used  . Alcohol use 0.0 oz/week     Comment: occasionally red wine   Review of Systems  Objective:   Vitals:   09/09/16 0805  BP: (!) 145/82  Pulse: 91  Temp: 98.3 F (36.8 C)  SpO2: 94%  Weight: 290 lb (131.5 kg)   Body mass index is 42.83 kg/m. Wt Readings from Last 3 Encounters:  09/09/16 290 lb (131.5 kg)  08/11/16 280 lb (127 kg)  07/16/15 272 lb 3.2 oz (123.5 kg)   Physical Exam  Constitutional: She appears well-developed and well-nourished.  Morbidly obese; weight gain 18 pounds since last year  HENT:  Head: Normocephalic and atraumatic.  Eyes: Conjunctivae and EOM are normal. Right eye exhibits no hordeolum. Left eye exhibits no hordeolum. No scleral icterus.  Neck: Carotid bruit is not present. No thyromegaly present.  Cardiovascular: Normal rate, regular rhythm, S1 normal, S2 normal and normal heart sounds.   No extrasystoles are present.  Pulmonary/Chest: Effort normal and breath sounds normal. No respiratory distress. Right breast exhibits no inverted nipple, no mass, no nipple discharge, no skin change and no tenderness. Left breast exhibits no inverted nipple, no mass, no nipple discharge, no skin change and no tenderness. Breasts are symmetrical.  Large breasts  Abdominal: Soft. Normal appearance and bowel sounds are normal. She exhibits no distension, no abdominal  bruit, no pulsatile midline mass and no mass. There is no hepatosplenomegaly. There is no tenderness. No hernia.  Genitourinary: Uterus normal. Pelvic exam was performed with patient prone. There is no rash or lesion on the right labia. There is no rash or lesion on the left labia. Cervix exhibits no motion tenderness. Right adnexum displays no mass, no tenderness and no fullness. Left adnexum displays no mass, no tenderness and no fullness.  Genitourinary Comments: Thin prep collected; visible transition zone  Musculoskeletal: Normal  range of motion. She exhibits no edema.  Lymphadenopathy:       Head (right side): No submandibular adenopathy present.       Head (left side): No submandibular adenopathy present.    She has no cervical adenopathy.    She has no axillary adenopathy.  Neurological: She is alert. She displays no tremor. No cranial nerve deficit. She exhibits normal muscle tone. Gait normal.  Skin: Skin is warm and dry. No bruising and no ecchymosis noted. No cyanosis. No pallor.  Psychiatric: Her speech is normal and behavior is normal. Thought content normal. Her mood appears not anxious. She does not exhibit a depressed mood.   Assessment/Plan:   Problem List Items Addressed This Visit      Other   Cervical cancer screening    Pap today      Relevant Orders   Pap, ThinPrep rflx HPV   Abnormal mammogram of left breast    Check diag mammo and [place]      Relevant Orders   MM Digital Diagnostic Bilat   US BREAST LTD UNI LEFT INC AXILLA   US BREAST LTD UNI RIGHT INC AXILLA    Other Visit Diagnoses    Preventative health care    -  Primary   Relevant Orders   TSH   Lipid panel   COMPLETE METABOLIC PANEL WITH GFR   CBC with Differential/Platelet   Need for diphtheria-tetanus-pertussis (Tdap) vaccine       Relevant Orders   Tdap vaccine greater than or equal to 7yo IM (Completed)      No orders of the defined types were placed in this encounter.  Orders Placed This Encounter  Procedures  . MM Digital Diagnostic Bilat    Standing Status:   Future    Standing Expiration Date:   11/09/2017    Order Specific Question:   Reason for Exam (SYMPTOM  OR DIAGNOSIS REQUIRED)    Answer:   abnormal LEFT mammo    Order Specific Question:   Is the patient pregnant?    Answer:   No    Order Specific Question:   Preferred imaging location?    Answer:   [facility]  . US BREAST LTD UNI LEFT INC AXILLA    Standing Status:   Future    Standing Expiration Date:   11/09/2017    Order Specific  Question:   Reason for Exam (SYMPTOM  OR DIAGNOSIS REQUIRED)    Answer:   abnormal LEFT mammo    Order Specific Question:   Preferred imaging location?    Answer:   [facility]  . US BREAST LTD UNI RIGHT INC AXILLA    Standing Status:   Future    Standing Expiration Date:   11/09/2017    Order Specific Question:   Reason for Exam (SYMPTOM  OR DIAGNOSIS REQUIRED)    Answer:   abnormal LEFT mammo    Order Specific Question:   Preferred imaging location?    Answer:  [facility]  . Tdap vaccine greater than or equal to 7yo IM  . TSH  . Lipid panel  . COMPLETE METABOLIC PANEL WITH GFR  . CBC with Differential/Platelet   Follow up plan: Return in about 1 year (around 09/09/2017) for complete physical.  An After Visit Summary was printed and given to the patient.

## 2016-09-09 NOTE — Assessment & Plan Note (Signed)
Check diag mammo and [place]

## 2016-09-09 NOTE — Assessment & Plan Note (Signed)
Pap today

## 2016-09-09 NOTE — Patient Instructions (Addendum)
Your goal blood pressure is less than 140 mmHg on top. Try to follow the DASH guidelines (DASH stands for Dietary Approaches to Stop Hypertension) Try to limit the sodium in your diet.  Ideally, consume less than 1.5 grams (less than 1,52m) per day. Do not add salt when cooking or at the table.  Check the sodium amount on labels when shopping, and choose items lower in sodium when given a choice. Avoid or limit foods that already contain a lot of sodium. Eat a diet rich in fruits and vegetables and whole grains.  Check out the information at familydoctor.org entitled "Nutrition for Weight Loss: What You Need to Know about Fad Diets" Try to lose between 1-2 pounds per week by taking in fewer calories and burning off more calories You can succeed by limiting portions, limiting foods dense in calories and fat, becoming more active, and drinking 8 glasses of water a day (64 ounces) Don't skip meals, especially breakfast, as skipping meals may alter your metabolism Do not use over-the-counter weight loss pills or gimmicks that claim rapid weight loss A healthy BMI (or body mass index) is between 18.5 and 24.9 You can calculate your ideal BMI at the NBuchanan[employer].fr Health Maintenance, Female Adopting a healthy lifestyle and getting preventive care can go a long way to promote health and wellness. Talk with your health care provider about what schedule of regular examinations is right for you. This is a good chance for you to check in with your provider about disease prevention and staying healthy. In between checkups, there are plenty of things you can do on your own. Experts have done a lot of research about which lifestyle changes and preventive measures are most likely to keep you healthy. Ask your health care provider for more information. WEIGHT AND DIET  Eat a healthy diet  Be sure to include plenty of vegetables, fruits, low-fat  dairy products, and lean protein.  Do not eat a lot of foods high in solid fats, added sugars, or salt.  Get regular exercise. This is one of the most important things you can do for your health.  Most adults should exercise for at least 150 minutes each week. The exercise should increase your heart rate and make you sweat (moderate-intensity exercise).  Most adults should also do strengthening exercises at least twice a week. This is in addition to the moderate-intensity exercise.  Maintain a healthy weight  Body mass index (BMI) is a measurement that can be used to identify possible weight problems. It estimates body fat based on height and weight. Your health care provider can help determine your BMI and help you achieve or maintain a healthy weight.  For females 244years of age and older:   A BMI below 18.5 is considered underweight.  A BMI of 18.5 to 24.9 is normal.  A BMI of 25 to 29.9 is considered overweight.  A BMI of 30 and above is considered obese.  Watch levels of cholesterol and blood lipids  You should start having your blood tested for lipids and cholesterol at 41years of age, then have this test every 5 years.  You may need to have your cholesterol levels checked more often if:  Your lipid or cholesterol levels are high.  You are older than 41years of age.  You are at high risk for heart disease.  CANCER SCREENING   Lung Cancer  Lung cancer screening is recommended for adults [id number] old who are  at high risk for lung cancer because of a history of smoking.  A yearly low-dose CT scan of the lungs is recommended for people who:  Currently smoke.  Have quit within the past 15 years.  Have at least a 30-pack-year history of smoking. A pack year is smoking an average of one pack of cigarettes a day for 1 year.  Yearly screening should continue until it has been 15 years since you quit.  Yearly screening should stop if you develop a health  problem that would prevent you from having lung cancer treatment.  Breast Cancer  Practice breast self-awareness. This means understanding how your breasts normally appear and feel.  It also means doing regular breast self-exams. Let your health care provider know about any changes, no matter how small.  If you are in your 20s or 30s, you should have a clinical breast exam (CBE) by a health care provider every 1-3 years as part of a regular health exam.  If you are 38 or older, have a CBE every year. Also consider having a breast X-ray (mammogram) every year.  If you have a family history of breast cancer, talk to your health care provider about genetic screening.  If you are at high risk for breast cancer, talk to your health care provider about having an MRI and a mammogram every year.  Breast cancer gene (BRCA) assessment is recommended for women who have family members with BRCA-related cancers. BRCA-related cancers include:  Breast.  Ovarian.  Tubal.  Peritoneal cancers.  Results of the assessment will determine the need for genetic counseling and BRCA1 and BRCA2 testing. Cervical Cancer Your health care provider may recommend that you be screened regularly for cancer of the pelvic organs (ovaries, uterus, and vagina). This screening involves a pelvic examination, including checking for microscopic changes to the surface of your cervix (Pap test). You may be encouraged to have this screening done every 3 years, beginning at age 59.  For women ages 77-65, health care providers may recommend pelvic exams and Pap testing every 3 years, or they may recommend the Pap and pelvic exam, combined with testing for human papilloma virus (HPV), every 5 years. Some types of HPV increase your risk of cervical cancer. Testing for HPV may also be done on women of any age with unclear Pap test results.  Other health care providers may not recommend any screening for nonpregnant women who are  considered low risk for pelvic cancer and who do not have symptoms. Ask your health care provider if a screening pelvic exam is right for you.  If you have had past treatment for cervical cancer or a condition that could lead to cancer, you need Pap tests and screening for cancer for at least 20 years after your treatment. If Pap tests have been discontinued, your risk factors (such as having a new sexual partner) need to be reassessed to determine if screening should resume. Some women have medical problems that increase the chance of getting cervical cancer. In these cases, your health care provider may recommend more frequent screening and Pap tests. Colorectal Cancer  This type of cancer can be detected and often prevented.  Routine colorectal cancer screening usually begins at 41 years of age and continues through 41 years of age.  Your health care provider may recommend screening at an earlier age if you have risk factors for colon cancer.  Your health care provider may also recommend using home test kits to check for hidden  blood in the stool.  A small camera at the end of a tube can be used to examine your colon directly (sigmoidoscopy or colonoscopy). This is done to check for the earliest forms of colorectal cancer.  Routine screening usually begins at age 53.  Direct examination of the colon should be repeated every 5-10 years through 41 years of age. However, you may need to be screened more often if early forms of precancerous polyps or small growths are found. Skin Cancer  Check your skin from head to toe regularly.  Tell your health care provider about any new moles or changes in moles, especially if there is a change in a mole's shape or color.  Also tell your health care provider if you have a mole that is larger than the size of a pencil eraser.  Always use sunscreen. Apply sunscreen liberally and repeatedly throughout the day.  Protect yourself by wearing long sleeves,  pants, a wide-brimmed hat, and sunglasses whenever you are outside. HEART DISEASE, DIABETES, AND HIGH BLOOD PRESSURE   High blood pressure causes heart disease and increases the risk of stroke. High blood pressure is more likely to develop in:  People who have blood pressure in the high end of the normal range (130-139/85-89 mm Hg).  People who are overweight or obese.  People who are African American.  If you are 38-68 years of age, have your blood pressure checked every 3-5 years. If you are 65 years of age or older, have your blood pressure checked every year. You should have your blood pressure measured twice--once when you are at a hospital or clinic, and once when you are not at a hospital or clinic. Record the average of the two measurements. To check your blood pressure when you are not at a hospital or clinic, you can use:  An automated blood pressure machine at a pharmacy.  A home blood pressure monitor.  If you are between 62 years and 21 years old, ask your health care provider if you should take aspirin to prevent strokes.  Have regular diabetes screenings. This involves taking a blood sample to check your fasting blood sugar level.  If you are at a normal weight and have a low risk for diabetes, have this test once every three years after 41 years of age.  If you are overweight and have a high risk for diabetes, consider being tested at a younger age or more often. PREVENTING INFECTION  Hepatitis B  If you have a higher risk for hepatitis B, you should be screened for this virus. You are considered at high risk for hepatitis B if:  You were born in a country where hepatitis B is common. Ask your health care provider which countries are considered high risk.  Your parents were born in a high-risk country, and you have not been immunized against hepatitis B (hepatitis B vaccine).  You have HIV or AIDS.  You use needles to inject street drugs.  You live with someone  who has hepatitis B.  You have had sex with someone who has hepatitis B.  You get hemodialysis treatment.  You take certain medicines for conditions, including cancer, organ transplantation, and autoimmune conditions. Hepatitis C  Blood testing is recommended for:  Everyone born from [birth year] through 1966.  Anyone with known risk factors for hepatitis C. Sexually transmitted infections (STIs)  You should be screened for sexually transmitted infections (STIs) including gonorrhea and chlamydia if:  You are sexually active and are  younger than 41 years of age.  You are older than 41 years of age and your health care provider tells you that you are at risk for this type of infection.  Your sexual activity has changed since you were last screened and you are at an increased risk for chlamydia or gonorrhea. Ask your health care provider if you are at risk.  If you do not have HIV, but are at risk, it may be recommended that you take a prescription medicine daily to prevent HIV infection. This is called pre-exposure prophylaxis (PrEP). You are considered at risk if:  You are sexually active and do not regularly use condoms or know the HIV status of your partner(s).  You take drugs by injection.  You are sexually active with a partner who has HIV. Talk with your health care provider about whether you are at high risk of being infected with HIV. If you choose to begin PrEP, you should first be tested for HIV. You should then be tested every 3 months for as long as you are taking PrEP.  PREGNANCY   If you are premenopausal and you may become pregnant, ask your health care provider about preconception counseling.  If you may become pregnant, take 400 to 800 micrograms (mcg) of folic acid every day.  If you want to prevent pregnancy, talk to your health care provider about birth control (contraception). OSTEOPOROSIS AND MENOPAUSE   Osteoporosis is a disease in which the bones lose minerals  and strength with aging. This can result in serious bone fractures. Your risk for osteoporosis can be identified using a bone density scan.  If you are 88 years of age or older, or if you are at risk for osteoporosis and fractures, ask your health care provider if you should be screened.  Ask your health care provider whether you should take a calcium or vitamin D supplement to lower your risk for osteoporosis.  Menopause may have certain physical symptoms and risks.  Hormone replacement therapy may reduce some of these symptoms and risks. Talk to your health care provider about whether hormone replacement therapy is right for you.  HOME CARE INSTRUCTIONS   Schedule regular health, dental, and eye exams.  Stay current with your immunizations.   Do not use any tobacco products including cigarettes, chewing tobacco, or electronic cigarettes.  If you are pregnant, do not drink alcohol.  If you are breastfeeding, limit how much and how often you drink alcohol.  Limit alcohol intake to no more than 1 drink per day for nonpregnant women. One drink equals 12 ounces of beer, 5 ounces of wine, or 1 ounces of hard liquor.  Do not use street drugs.  Do not share needles.  Ask your health care provider for help if you need support or information about quitting drugs.  Tell your health care provider if you often feel depressed.  Tell your health care provider if you have ever been abused or do not feel safe at home.   This information is not intended to replace advice given to you by your health care provider. Make sure you discuss any questions you have with your health care provider.   Document Released: 06/23/2011 Document Revised: 12/29/2014 Document Reviewed: 11/09/2013 Elsevier Interactive Patient Education [employer].

## 2016-09-10 LAB — COMPLETE METABOLIC PANEL WITH GFR
ALBUMIN: 4 g/dL (ref 3.6–5.1)
ALK PHOS: 56 U/L (ref 33–115)
ALT: 13 U/L (ref 6–29)
AST: 14 U/L (ref 10–30)
BILIRUBIN TOTAL: 0.3 mg/dL (ref 0.2–1.2)
BUN: 12 mg/dL (ref 7–25)
CALCIUM: 9.4 mg/dL (ref 8.6–10.2)
CO2: 25 mmol/L (ref 20–31)
CREATININE: 0.77 mg/dL (ref 0.50–1.10)
Chloride: 102 mmol/L (ref 98–110)
Glucose, Bld: 109 mg/dL — ABNORMAL HIGH (ref 65–99)
Potassium: 4.9 mmol/L (ref 3.5–5.3)
Sodium: 138 mmol/L (ref 135–146)
TOTAL PROTEIN: 6.9 g/dL (ref 6.1–8.1)

## 2016-09-10 LAB — CBC WITH DIFFERENTIAL/PLATELET
BASOS PCT: 0 %
Basophils Absolute: 0 cells/uL (ref 0–200)
EOS ABS: 142 {cells}/uL (ref 15–500)
EOS PCT: 2 %
HCT: 41.7 % (ref 35.0–45.0)
Hemoglobin: 13.6 g/dL (ref 11.7–15.5)
LYMPHS PCT: 30 %
Lymphs Abs: 2130 cells/uL (ref 850–3900)
MCH: 27.4 pg (ref 27.0–33.0)
MCHC: 32.6 g/dL (ref 32.0–36.0)
MCV: 84.1 fL (ref 80.0–100.0)
MONOS PCT: 4 %
MPV: 9.6 fL (ref 7.5–12.5)
Monocytes Absolute: 284 cells/uL (ref 200–950)
NEUTROS ABS: 4544 {cells}/uL (ref 1500–7800)
Neutrophils Relative %: 64 %
PLATELETS: 380 10*3/uL (ref 140–400)
RBC: 4.96 MIL/uL (ref 3.80–5.10)
RDW: 14.3 % (ref 11.0–15.0)
WBC: 7.1 10*3/uL (ref 3.8–10.8)

## 2016-09-10 LAB — LIPID PANEL
CHOLESTEROL: 214 mg/dL — AB (ref 125–200)
HDL: 85 mg/dL (ref >=46)
LDL Cholesterol: 109 mg/dL (ref <130)
TRIGLYCERIDES: 100 mg/dL (ref <150)
Total CHOL/HDL Ratio: 2.5 Ratio (ref <=5.0)
VLDL: 20 mg/dL (ref <30)

## 2016-09-10 LAB — PAP, THINPREP RFLX HPV

## 2016-09-10 LAB — TSH: TSH: 4.49 mIU/L

## 2016-09-12 ENCOUNTER — Encounter: Payer: Self-pay | Admitting: Family Medicine

## 2016-09-12 ENCOUNTER — Other Ambulatory Visit: Payer: Self-pay | Admitting: Family Medicine

## 2016-09-12 DIAGNOSIS — F3340 Major depressive disorder, recurrent, in remission, unspecified: Secondary | ICD-10-CM

## 2016-09-12 MED ORDER — SERTRALINE HCL 25 MG PO TABS: 25.0000 mg | ORAL_TABLET | Freq: Every day | ORAL | 1 refills | Status: DC

## 2016-09-12 MED ORDER — BUPROPION HCL ER (XL) 300 MG PO TB24: 300.0000 mg | ORAL_TABLET | Freq: Every day | ORAL | 1 refills | Status: DC

## 2016-09-12 NOTE — Telephone Encounter (Signed)
rxs approved

## 2016-10-03 ENCOUNTER — Encounter: Payer: Self-pay | Admitting: Family Medicine

## 2016-10-03 MED ORDER — NORETHINDRONE-ETH ESTRADIOL 1-35 MG-MCG PO TABS: 1.0000 | ORAL_TABLET | Freq: Every day | ORAL | 3 refills | Status: DC

## 2016-10-08 LAB — HUMAN PAPILLOMAVIRUS, HIGH RISK: HPV DNA HIGH RISK: NOT DETECTED

## 2016-10-10 ENCOUNTER — Ambulatory Visit
Admission: RE | Admit: 2016-10-10 | Discharge: 2016-10-10 | Disposition: A | Discharge status: Home / Self Care | Payer: 59 | Source: Ambulatory Visit | Attending: Family Medicine | Admitting: Family Medicine

## 2016-10-10 ENCOUNTER — Other Ambulatory Visit: Payer: Self-pay | Admitting: Family Medicine

## 2016-10-10 DIAGNOSIS — R928 Other abnormal and inconclusive findings on diagnostic imaging of breast: Secondary | ICD-10-CM

## 2016-10-10 DIAGNOSIS — N6320 Unspecified lump in the left breast, unspecified quadrant: Secondary | ICD-10-CM | POA: Insufficient documentation

## 2017-01-02 ENCOUNTER — Telehealth: Payer: Self-pay | Admitting: Family Medicine

## 2017-01-02 NOTE — Telephone Encounter (Signed)
Patient is not able to get a refill on pirmella. Called [facility] to see why its not showing up on their end that she has 3 refills left. Natalie Petersen a representative with [facility] stated that she couldn't find her in the system at all. I went over her first and last name three times and she is unable to pull her account up. She suggest to have the patient call her Benefits department with her insurance to see if there is an issue on their end. Called patient and discuss this with her and asked her to give them a call and called [facility] after speaking with benefits and if that do not resolve the issue to give me a call back

## 2017-01-02 NOTE — Telephone Encounter (Signed)
Patient states that she has refills on pirmella however when she called into [facility] through the automated system it does not recognize the rx number. Asking that you please call in her refill she will be out Sunday. Please send to [facility]

## 2017-01-21 ENCOUNTER — Telehealth: Payer: Self-pay | Admitting: Family Medicine

## 2017-01-21 MED ORDER — OSELTAMIVIR PHOSPHATE 75 MG PO CAPS: 75.0000 mg | ORAL_CAPSULE | Freq: Every day | ORAL | 0 refills | Status: AC

## 2017-01-21 NOTE — Telephone Encounter (Signed)
Pt called back checking status. She was informed of what you said. Pt thanks you.

## 2017-01-21 NOTE — Telephone Encounter (Signed)
Pt is currently out of town taking care of her parents. One of her parents just got out of the hospital and the other one was confirmed with the flu on Monday. Patient would like to know if its possible for you to prescribe something for a preventative. If so [facility] [place] on [address] or [place] be the [place]

## 2017-01-21 NOTE — Telephone Encounter (Signed)
Rx sent to what I believe is the right pharmacy (please give her phone number so she'll have it) Please let her know I sent the Tamiflu as requested Use good hand hygiene, take 500 mg vitamin C daily which may help as well We hope she stays well

## 2017-03-22 ENCOUNTER — Other Ambulatory Visit: Payer: Self-pay | Admitting: Family Medicine

## 2017-03-22 DIAGNOSIS — F3340 Major depressive disorder, recurrent, in remission, unspecified: Secondary | ICD-10-CM

## 2017-05-29 DIAGNOSIS — J0140 Acute pansinusitis, unspecified: Secondary | ICD-10-CM | POA: Diagnosis not present

## 2017-06-05 DIAGNOSIS — H60391 Other infective otitis externa, right ear: Secondary | ICD-10-CM | POA: Diagnosis not present

## 2017-06-15 DIAGNOSIS — H6993 Unspecified Eustachian tube disorder, bilateral: Secondary | ICD-10-CM | POA: Diagnosis not present

## 2017-06-15 DIAGNOSIS — R030 Elevated blood-pressure reading, without diagnosis of hypertension: Secondary | ICD-10-CM | POA: Diagnosis not present

## 2017-06-22 ENCOUNTER — Encounter: Payer: Self-pay | Admitting: Family Medicine

## 2017-06-22 ENCOUNTER — Other Ambulatory Visit: Payer: Self-pay | Admitting: Family Medicine

## 2017-06-22 DIAGNOSIS — F3340 Major depressive disorder, recurrent, in remission, unspecified: Secondary | ICD-10-CM

## 2017-06-22 NOTE — Telephone Encounter (Signed)
Pt not seen in the last 68mo, and requires 90-day supply due to using Mail Order. Will defer to Dr. LSanda Kleinfor refills.

## 2017-09-04 ENCOUNTER — Other Ambulatory Visit: Payer: Self-pay | Admitting: Family Medicine

## 2017-09-07 ENCOUNTER — Other Ambulatory Visit: Payer: Self-pay

## 2017-09-07 ENCOUNTER — Encounter: Payer: Self-pay | Admitting: Family Medicine

## 2017-09-07 MED ORDER — NORETHINDRONE-ETH ESTRADIOL 1-35 MG-MCG PO TABS
1.0000 | ORAL_TABLET | Freq: Every day | ORAL | 0 refills | Status: DC
Start: 2017-09-07 — End: 2017-09-22

## 2017-09-07 NOTE — Telephone Encounter (Signed)
Pt has a CPE schedule on 10/13/2017. She had it schedule in September but she states that is when her cycle will be on therefore she had to reschedule the apt. That apt was the  next aviable for her schedule and Dr. lada. She states she is completely out of her birth control and will not have it until she see Dr. lada for her CPE. She's asking if she can get a 30 day supply until she sees Dr.Lada. She's on her cycle this week but she will need it for next week.  Pt asked to be sent to [facility] [address] in [place].

## 2017-09-11 ENCOUNTER — Encounter: Payer: 59 | Admitting: Family Medicine

## 2017-09-21 ENCOUNTER — Other Ambulatory Visit: Payer: Self-pay | Admitting: Family Medicine

## 2017-09-21 DIAGNOSIS — F3340 Major depressive disorder, recurrent, in remission, unspecified: Secondary | ICD-10-CM

## 2017-09-21 NOTE — Telephone Encounter (Signed)
appt later this month rxs approved

## 2017-09-22 ENCOUNTER — Other Ambulatory Visit: Payer: Self-pay

## 2017-09-22 ENCOUNTER — Encounter: Payer: Self-pay | Admitting: Family Medicine

## 2017-09-22 MED ORDER — NORETHINDRONE-ETH ESTRADIOL 1-35 MG-MCG PO TABS: 1.0000 | ORAL_TABLET | Freq: Every day | ORAL | 0 refills | Status: DC

## 2017-09-22 NOTE — Telephone Encounter (Signed)
I just sent in the refill today; thank you

## 2017-09-22 NOTE — Telephone Encounter (Signed)
Patient sent email request for ocp. scheduled my annual with you for the week of the 22nd, which will be off my cycle. However, that is also the week I will need my new pack of Birth Control. Can another packet be called in the week of the 15th?

## 2017-10-13 ENCOUNTER — Encounter: Payer: Self-pay | Admitting: Family Medicine

## 2017-10-13 ENCOUNTER — Ambulatory Visit (INDEPENDENT_AMBULATORY_CARE_PROVIDER_SITE_OTHER): Payer: 59 | Admitting: Family Medicine

## 2017-10-13 VITALS — BP 122/72 | HR 90 | Temp 98.1°F | Ht 69.0 in | Wt 299.3 lb

## 2017-10-13 DIAGNOSIS — Z0000 Encounter for general adult medical examination without abnormal findings: Secondary | ICD-10-CM

## 2017-10-13 DIAGNOSIS — Z1231 Encounter for screening mammogram for malignant neoplasm of breast: Secondary | ICD-10-CM

## 2017-10-13 DIAGNOSIS — N889 Noninflammatory disorder of cervix uteri, unspecified: Secondary | ICD-10-CM

## 2017-10-13 DIAGNOSIS — R5383 Other fatigue: Secondary | ICD-10-CM | POA: Diagnosis not present

## 2017-10-13 DIAGNOSIS — E559 Vitamin D deficiency, unspecified: Secondary | ICD-10-CM | POA: Diagnosis not present

## 2017-10-13 DIAGNOSIS — Z124 Encounter for screening for malignant neoplasm of cervix: Secondary | ICD-10-CM

## 2017-10-13 DIAGNOSIS — N898 Other specified noninflammatory disorders of vagina: Secondary | ICD-10-CM | POA: Diagnosis not present

## 2017-10-13 LAB — MAGNESIUM: Magnesium: 1.7 mg/dL (ref 1.5–2.5)

## 2017-10-13 NOTE — Assessment & Plan Note (Signed)
Check level and supplement as indicated

## 2017-10-13 NOTE — Progress Notes (Signed)
Patient ID: Natalie Petersen, female   DOB: 11-Feb-1976, 41 y.o.   MRN: [id number]   Subjective:   GERALYN Petersen is a 41 y.o. female here for a complete physical exam  Interim issues since last visit: nothing major; hasn't felt good after taking tamiflu; not having palpitations; feels jittery and not herself; has had EKG done, went to urgent care down on [facility] and they sent her to the ER  USPSTF grade A and B recommendations Depression:  Depression screen [facility] 2/9 10/13/2017 09/09/2016 07/16/2015  Decreased Interest 0 0 0  Down, Depressed, Hopeless 0 0 0  PHQ - 2 Score 0 0 0   Hypertension: does not think it's right; usually high at other offices BP Readings from Last 3 Encounters:  10/13/17 122/72  09/09/16 (!) 145/82  08/12/16 (!) 161/119   Obesity: Wt Readings from Last 3 Encounters:  10/13/17 299 lb 4.8 oz (135.8 kg)  09/09/16 290 lb (131.5 kg)  08/11/16 280 lb (127 kg)   BMI Readings from Last 3 Encounters:  10/13/17 44.20 kg/m  09/09/16 42.83 kg/m  08/11/16 41.35 kg/m    Alcohol: red wine, 3 glasses a week Tobacco use: never smoker HIV, hep B, hep C: not interested STD testing and prevention (chl/gon/syphilis): not needed Intimate partner violence: no abuse Breast cancer: no lumps BRCA gene screening: aunt ovarian cancer; not interested Cervical cancer screening: Sept 2017, next due Sept 2020 Fall prevention/vitamin D: check today Lipids:  Lab Results  Component Value Date   CHOL 214 (H) 09/09/2016   CHOL 208 (A) 01/23/2014   Lab Results  Component Value Date   HDL 85 09/09/2016   HDL 78 (A) 01/23/2014   Lab Results  Component Value Date   LDLCALC 109 09/09/2016   LDLCALC 114 01/23/2014   Lab Results  Component Value Date   TRIG 100 09/09/2016   TRIG 81 01/23/2014   Lab Results  Component Value Date   CHOLHDL 2.5 09/09/2016   No results found for: LDLDIRECT Glucose:  Glucose, Bld  Date Value Ref Range Status  09/09/2016 109 (H) 65 - 99 mg/dL  Final  08/11/2016 106 (H) 65 - 99 mg/dL Final   Colorectal cancer: no fam hx; start at age 89 Diet: getting adequate calcium, fiber; adequate water Exercise: trying and joining aquatic center for swimming Skin cancer: no worrisome moles; sees skin doctor next month just for a check  Past Medical History:  Diagnosis Date  . Allergy    seasonal  . Depression   . Menopausal symptoms   . Metrorrhagia   . Vitamin D deficiency    Past Surgical History:  Procedure Laterality Date  . BRAIN SURGERY     Family History  Problem Relation Age of Onset  . Heart murmur Mother   . Atrial fibrillation Mother   . Arthritis Father        RA  . Hyperlipidemia Father   . Cancer Maternal Aunt        ovarian and uterine   . Arthritis Maternal Grandmother   . Heart attack Maternal Grandfather   . Stroke Paternal Grandmother   . Arthritis Paternal Grandmother   . Breast cancer Paternal Grandmother   . Stroke Paternal Uncle   mother had surgery at [place] History  Substance Use Topics  . Smoking status: Never Smoker  . Smokeless tobacco: Never Used  . Alcohol use 0.0 oz/week     Comment: occasionally red wine   Review of Systems  Constitutional: Negative for unexpected weight change.  Respiratory: Negative for shortness of breath.   Cardiovascular: Negative for chest pain and leg swelling.  Gastrointestinal: Negative for blood in stool.  Endocrine: Negative for polydipsia.  Genitourinary: Negative for hematuria.  Musculoskeletal: Negative for arthralgias.  Skin: Negative for rash.  Hematological: Negative for adenopathy. Does not bruise/bleed easily.    Objective:   Vitals:   10/13/17 0829  BP: 122/72  Pulse: 90  Temp: 98.1 F (36.7 C)  TempSrc: Oral  SpO2: 97%  Weight: 299 lb 4.8 oz (135.8 kg)  Height: 5' 9"  (1.753 m)   Body mass index is 44.2 kg/m. Wt Readings from Last 3 Encounters:  10/13/17 299 lb 4.8 oz (135.8 kg)  09/09/16 290 lb (131.5 kg)  08/11/16 280  lb (127 kg)   Physical Exam  Constitutional: She appears well-developed and well-nourished.  HENT:  Head: Normocephalic and atraumatic.  Eyes: Conjunctivae and EOM are normal. Right eye exhibits no hordeolum. Left eye exhibits no hordeolum. No scleral icterus.  Neck: Carotid bruit is not present. No thyromegaly present.  Cardiovascular: Normal rate, regular rhythm, S1 normal, S2 normal and normal heart sounds.   No extrasystoles are present.  Pulmonary/Chest: Effort normal and breath sounds normal. No respiratory distress. Right breast exhibits no inverted nipple, no mass, no nipple discharge, no skin change and no tenderness. Left breast exhibits no inverted nipple, no mass, no nipple discharge, no skin change and no tenderness. Breasts are symmetrical.  Abdominal: Soft. Normal appearance and bowel sounds are normal. She exhibits no distension, no abdominal bruit, no pulsatile midline mass and no mass. There is no hepatosplenomegaly. There is no tenderness. No hernia.  Genitourinary: Uterus normal. Pelvic exam was performed with patient prone. There is no rash or lesion on the right labia. There is no rash or lesion on the left labia. Cervix exhibits friability. Cervix exhibits no motion tenderness and no discharge. Right adnexum displays no mass, no tenderness and no fullness. Left adnexum displays no mass, no tenderness and no fullness. Vaginal discharge found.  Musculoskeletal: Normal range of motion. She exhibits no edema.  Lymphadenopathy:       Head (right side): No submandibular adenopathy present.       Head (left side): No submandibular adenopathy present.    She has no cervical adenopathy.    She has no axillary adenopathy.  Neurological: She is alert. She displays no tremor. No cranial nerve deficit. She exhibits normal muscle tone. Gait normal.  Skin: Skin is warm and dry. No bruising and no ecchymosis noted. No cyanosis. No pallor.  Psychiatric: Her speech is normal and behavior is  normal. Thought content normal. Her mood appears not anxious. She does not exhibit a depressed mood.    Assessment/Plan:   Problem List Items Addressed This Visit      Other   Vitamin D deficiency    Check level and supplement as indicated      Relevant Orders   VITAMIN D 25 Hydroxy (Vit-D Deficiency, Fractures)   Obesity, Class III, BMI 40-49.9 (morbid obesity) ([place])    Encouraged weight loss      Relevant Orders   Amb Referral to Bariatric Surgery   Annual physical exam - Primary    USPSTF grade A and B recommendations reviewed with patient; age-appropriate recommendations, preventive care, screening tests, etc discussed and encouraged; healthy living encouraged; see AVS for patient education given to patient      Relevant Orders   CBC with Differential/Platelet  COMPLETE METABOLIC PANEL WITH GFR   Lipid panel   TSH    Other Visit Diagnoses    Screening for cervical cancer       Relevant Orders   Pap IG and HPV (high risk) DNA detection   Other fatigue       Relevant Orders   Magnesium   Abnormal appearance of cervix       friable, refer to GYN; hx of HPV; difficult pap given her morbid obesity   Relevant Orders   Ambulatory referral to Gynecology   Encounter for screening mammogram for breast cancer       Relevant Orders   MM DIGITAL SCREENING BILATERAL       No orders of the defined types were placed in this encounter.  Orders Placed This Encounter  Procedures  . MM DIGITAL SCREENING BILATERAL    Standing Status:   Future    Standing Expiration Date:   12/13/2018    Order Specific Question:   Reason for Exam (SYMPTOM  OR DIAGNOSIS REQUIRED)    Answer:   Screening breast cancer    Order Specific Question:   Is the patient pregnant?    Answer:   No    Order Specific Question:   Preferred imaging location?    Answer:   [facility]  . CBC with Differential/Platelet  . COMPLETE METABOLIC PANEL WITH GFR  . Lipid panel  . TSH  . VITAMIN D 25 Hydroxy  (Vit-D Deficiency, Fractures)  . Magnesium  . Amb Referral to Bariatric Surgery    Referral Priority:   Routine    Referral Type:   Consultation    Number of Visits Requested:   1  . Ambulatory referral to Gynecology    Referral Priority:   Routine    Referral Type:   Consultation    Referral Reason:   Specialty Services Required    Requested Specialty:   Gynecology    Number of Visits Requested:   1    Follow up plan: Return in about 1 year (around 10/13/2018) for complete physical.  An After Visit Summary was printed and given to the patient.

## 2017-10-13 NOTE — Assessment & Plan Note (Signed)
USPSTF grade A and B recommendations reviewed with patient; age-appropriate recommendations, preventive care, screening tests, etc discussed and encouraged; healthy living encouraged; see AVS for patient education given to patient

## 2017-10-13 NOTE — Patient Instructions (Addendum)
Check out the information at familydoctor.org entitled "Nutrition for Weight Loss: What You Need to Know about Fad Diets" Try to lose between 1-2 pounds per week by taking in fewer calories and burning off more calories You can succeed by limiting portions, limiting foods dense in calories and fat, becoming more active, and drinking 8 glasses of water a day (64 ounces) Don't skip meals, especially breakfast, as skipping meals may alter your metabolism Do not use over-the-counter weight loss pills or gimmicks that claim rapid weight loss A healthy BMI (or body mass index) is between 18.5 and 24.9 You can calculate your ideal BMI at the [place] website ClubMonetize.fr Please do call to schedule your mammogram; the number to schedule one at either [facility] or [facility] is [phone]  Health Maintenance, Female Adopting a healthy lifestyle and getting preventive care can go a long way to promote health and wellness. Talk with your health care provider about what schedule of regular examinations is right for you. This is a good chance for you to check in with your provider about disease prevention and staying healthy. In between checkups, there are plenty of things you can do on your own. Experts have done a lot of research about which lifestyle changes and preventive measures are most likely to keep you healthy. Ask your health care provider for more information. Weight and diet Eat a healthy diet  Be sure to include plenty of vegetables, fruits, low-fat dairy products, and lean protein.  Do not eat a lot of foods high in solid fats, added sugars, or salt.  Get regular exercise. This is one of the most important things you can do for your health. ? Most adults should exercise for at least 150 minutes each week. The exercise should increase your heart rate and make you sweat (moderate-intensity exercise). ? Most  adults should also do strengthening exercises at least twice a week. This is in addition to the moderate-intensity exercise.  Maintain a healthy weight  Body mass index (BMI) is a measurement that can be used to identify possible weight problems. It estimates body fat based on height and weight. Your health care provider can help determine your BMI and help you achieve or maintain a healthy weight.  For females 49 years of age and older: ? A BMI below 18.5 is considered underweight. ? A BMI of 18.5 to 24.9 is normal. ? A BMI of 25 to 29.9 is considered overweight. ? A BMI of 30 and above is considered obese.  Watch levels of cholesterol and blood lipids  You should start having your blood tested for lipids and cholesterol at 42 years of age, then have this test every 5 years.  You may need to have your cholesterol levels checked more often if: ? Your lipid or cholesterol levels are high. ? You are older than 42 years of age. ? You are at high risk for heart disease.  Cancer screening Lung Cancer  Lung cancer screening is recommended for adults 59-50 years old who are at high risk for lung cancer because of a history of smoking.  A yearly low-dose CT scan of the lungs is recommended for people who: ? Currently smoke. ? Have quit within the past 15 years. ? Have at least a 30-pack-year history of smoking. A pack year is smoking an average of one pack of cigarettes a day for 1 year.  Yearly screening should continue until it has been 15 years since you quit.  Yearly screening should stop if you develop a health problem that would prevent you from having lung cancer treatment.  Breast Cancer  Practice breast self-awareness. This means understanding how your breasts normally appear and feel.  It also means doing regular breast self-exams. Let your health care provider know about any changes, no matter how small.  If you are in your 20s or 30s, you should have a clinical breast  exam (CBE) by a health care provider every 1-3 years as part of a regular health exam.  If you are 31 or older, have a CBE every year. Also consider having a breast X-ray (mammogram) every year.  If you have a family history of breast cancer, talk to your health care provider about genetic screening.  If you are at high risk for breast cancer, talk to your health care provider about having an MRI and a mammogram every year.  Breast cancer gene (BRCA) assessment is recommended for women who have family members with BRCA-related cancers. BRCA-related cancers include: ? Breast. ? Ovarian. ? Tubal. ? Peritoneal cancers.  Results of the assessment will determine the need for genetic counseling and BRCA1 and BRCA2 testing.  Cervical Cancer Your health care provider may recommend that you be screened regularly for cancer of the pelvic organs (ovaries, uterus, and vagina). This screening involves a pelvic examination, including checking for microscopic changes to the surface of your cervix (Pap test). You may be encouraged to have this screening done every 3 years, beginning at age 30.  For women ages 54-65, health care providers may recommend pelvic exams and Pap testing every 3 years, or they may recommend the Pap and pelvic exam, combined with testing for human papilloma virus (HPV), every 5 years. Some types of HPV increase your risk of cervical cancer. Testing for HPV may also be done on women of any age with unclear Pap test results.  Other health care providers may not recommend any screening for nonpregnant women who are considered low risk for pelvic cancer and who do not have symptoms. Ask your health care provider if a screening pelvic exam is right for you.  If you have had past treatment for cervical cancer or a condition that could lead to cancer, you need Pap tests and screening for cancer for at least 20 years after your treatment. If Pap tests have been discontinued, your risk factors  (such as having a new sexual partner) need to be reassessed to determine if screening should resume. Some women have medical problems that increase the chance of getting cervical cancer. In these cases, your health care provider may recommend more frequent screening and Pap tests.  Colorectal Cancer  This type of cancer can be detected and often prevented.  Routine colorectal cancer screening usually begins at 43 years of age and continues through 42 years of age.  Your health care provider may recommend screening at an earlier age if you have risk factors for colon cancer.  Your health care provider may also recommend using home test kits to check for hidden blood in the stool.  A small camera at the end of a tube can be used to examine your colon directly (sigmoidoscopy or colonoscopy). This is done to check for the earliest forms of colorectal cancer.  Routine screening usually begins at age 77.  Direct examination of the colon should be repeated every 5-10 years through 42 years of age. However, you may need to be screened more often if early forms of precancerous  polyps or small growths are found.  Skin Cancer  Check your skin from head to toe regularly.  Tell your health care provider about any new moles or changes in moles, especially if there is a change in a mole's shape or color.  Also tell your health care provider if you have a mole that is larger than the size of a pencil eraser.  Always use sunscreen. Apply sunscreen liberally and repeatedly throughout the day.  Protect yourself by wearing long sleeves, pants, a wide-brimmed hat, and sunglasses whenever you are outside.  Heart disease, diabetes, and high blood pressure  High blood pressure causes heart disease and increases the risk of stroke. High blood pressure is more likely to develop in: ? People who have blood pressure in the high end of the normal range (130-139/85-89 mm Hg). ? People who are overweight or  obese. ? People who are African American.  If you are 73-1 years of age, have your blood pressure checked every 3-5 years. If you are 5 years of age or older, have your blood pressure checked every year. You should have your blood pressure measured twice-once when you are at a hospital or clinic, and once when you are not at a hospital or clinic. Record the average of the two measurements. To check your blood pressure when you are not at a hospital or clinic, you can use: ? An automated blood pressure machine at a pharmacy. ? A home blood pressure monitor.  If you are between 31 years and 16 years old, ask your health care provider if you should take aspirin to prevent strokes.  Have regular diabetes screenings. This involves taking a blood sample to check your fasting blood sugar level. ? If you are at a normal weight and have a low risk for diabetes, have this test once every three years after 42 years of age. ? If you are overweight and have a high risk for diabetes, consider being tested at a younger age or more often. Preventing infection Hepatitis B  If you have a higher risk for hepatitis B, you should be screened for this virus. You are considered at high risk for hepatitis B if: ? You were born in a country where hepatitis B is common. Ask your health care provider which countries are considered high risk. ? Your parents were born in a high-risk country, and you have not been immunized against hepatitis B (hepatitis B vaccine). ? You have HIV or AIDS. ? You use needles to inject street drugs. ? You live with someone who has hepatitis B. ? You have had sex with someone who has hepatitis B. ? You get hemodialysis treatment. ? You take certain medicines for conditions, including cancer, organ transplantation, and autoimmune conditions.  Hepatitis C  Blood testing is recommended for: ? Everyone born from [birth year] through 1966. ? Anyone with known risk factors for hepatitis  C.  Sexually transmitted infections (STIs)  You should be screened for sexually transmitted infections (STIs) including gonorrhea and chlamydia if: ? You are sexually active and are younger than 42 years of age. ? You are older than 42 years of age and your health care provider tells you that you are at risk for this type of infection. ? Your sexual activity has changed since you were last screened and you are at an increased risk for chlamydia or gonorrhea. Ask your health care provider if you are at risk.  If you do not have HIV, but are at  risk, it may be recommended that you take a prescription medicine daily to prevent HIV infection. This is called pre-exposure prophylaxis (PrEP). You are considered at risk if: ? You are sexually active and do not regularly use condoms or know the HIV status of your partner(s). ? You take drugs by injection. ? You are sexually active with a partner who has HIV.  Talk with your health care provider about whether you are at high risk of being infected with HIV. If you choose to begin PrEP, you should first be tested for HIV. You should then be tested every 3 months for as long as you are taking PrEP. Pregnancy  If you are premenopausal and you may become pregnant, ask your health care provider about preconception counseling.  If you may become pregnant, take 400 to 800 micrograms (mcg) of folic acid every day.  If you want to prevent pregnancy, talk to your health care provider about birth control (contraception). Osteoporosis and menopause  Osteoporosis is a disease in which the bones lose minerals and strength with aging. This can result in serious bone fractures. Your risk for osteoporosis can be identified using a bone density scan.  If you are 4 years of age or older, or if you are at risk for osteoporosis and fractures, ask your health care provider if you should be screened.  Ask your health care provider whether you should take a calcium or  vitamin D supplement to lower your risk for osteoporosis.  Menopause may have certain physical symptoms and risks.  Hormone replacement therapy may reduce some of these symptoms and risks. Talk to your health care provider about whether hormone replacement therapy is right for you. Follow these instructions at home:  Schedule regular health, dental, and eye exams.  Stay current with your immunizations.  Do not use any tobacco products including cigarettes, chewing tobacco, or electronic cigarettes.  If you are pregnant, do not drink alcohol.  If you are breastfeeding, limit how much and how often you drink alcohol.  Limit alcohol intake to no more than 1 drink per day for nonpregnant women. One drink equals 12 ounces of beer, 5 ounces of wine, or 1 ounces of hard liquor.  Do not use street drugs.  Do not share needles.  Ask your health care provider for help if you need support or information about quitting drugs.  Tell your health care provider if you often feel depressed.  Tell your health care provider if you have ever been abused or do not feel safe at home. This information is not intended to replace advice given to you by your health care provider. Make sure you discuss any questions you have with your health care provider. Document Released: 06/23/2011 Document Revised: 05/15/2016 Document Reviewed: 09/11/2015 Elsevier Interactive Patient Education  [employer].

## 2017-10-13 NOTE — Addendum Note (Signed)
Addended by: Burch Marchuk, Satira Anis on: 10/13/2017 09:26 AM   Modules accepted: Orders

## 2017-10-13 NOTE — Assessment & Plan Note (Signed)
Encouraged weight loss

## 2017-10-14 ENCOUNTER — Encounter: Payer: Self-pay | Admitting: Family Medicine

## 2017-10-14 LAB — COMPLETE METABOLIC PANEL WITH GFR
AG Ratio: 1.4 (calc) (ref 1.0–2.5)
ALBUMIN MSPROF: 4 g/dL (ref 3.6–5.1)
ALKALINE PHOSPHATASE (APISO): 74 U/L (ref 33–115)
ALT: 29 U/L (ref 6–29)
AST: 21 U/L (ref 10–30)
BUN: 12 mg/dL (ref 7–25)
CO2: 27 mmol/L (ref 20–32)
CREATININE: 0.83 mg/dL (ref 0.50–1.10)
Calcium: 9.3 mg/dL (ref 8.6–10.2)
Chloride: 100 mmol/L (ref 98–110)
GFR, EST AFRICAN AMERICAN: 101 mL/min/{1.73_m2} (ref >=60)
GFR, EST NON AFRICAN AMERICAN: 87 mL/min/{1.73_m2} (ref >=60)
GLOBULIN: 2.8 g/dL (ref 1.9–3.7)
GLUCOSE: 110 mg/dL — AB (ref 65–99)
Potassium: 4.8 mmol/L (ref 3.5–5.3)
SODIUM: 137 mmol/L (ref 135–146)
TOTAL PROTEIN: 6.8 g/dL (ref 6.1–8.1)
Total Bilirubin: 0.4 mg/dL (ref 0.2–1.2)

## 2017-10-14 LAB — CBC WITH DIFFERENTIAL/PLATELET
BASOS ABS: 60 {cells}/uL (ref 0–200)
Basophils Relative: 0.7 %
EOS ABS: 86 {cells}/uL (ref 15–500)
Eosinophils Relative: 1 %
HEMATOCRIT: 42.4 % (ref 35.0–45.0)
HEMOGLOBIN: 14.2 g/dL (ref 11.7–15.5)
Lymphs Abs: 2434 cells/uL (ref 850–3900)
MCH: 27.5 pg (ref 27.0–33.0)
MCHC: 33.5 g/dL (ref 32.0–36.0)
MCV: 82 fL (ref 80.0–100.0)
MONOS PCT: 5.4 %
MPV: 9.6 fL (ref 7.5–12.5)
NEUTROS ABS: 5556 {cells}/uL (ref 1500–7800)
Neutrophils Relative %: 64.6 %
Platelets: 402 10*3/uL — ABNORMAL HIGH (ref 140–400)
RBC: 5.17 10*6/uL — ABNORMAL HIGH (ref 3.80–5.10)
RDW: 13 % (ref 11.0–15.0)
TOTAL LYMPHOCYTE: 28.3 %
WBC mixed population: 464 cells/uL (ref 200–950)
WBC: 8.6 10*3/uL (ref 3.8–10.8)

## 2017-10-14 LAB — TSH: TSH: 4.8 mIU/L — ABNORMAL HIGH

## 2017-10-14 LAB — LIPID PANEL
Cholesterol: 228 mg/dL — ABNORMAL HIGH (ref <200)
HDL: 83 mg/dL (ref >50)
LDL Cholesterol (Calc): 127 mg/dL (calc) — ABNORMAL HIGH
Non-HDL Cholesterol (Calc): 145 mg/dL (calc) — ABNORMAL HIGH (ref <130)
TRIGLYCERIDES: 84 mg/dL (ref <150)
Total CHOL/HDL Ratio: 2.7 (calc) (ref <5.0)

## 2017-10-14 LAB — WET PREP BY MOLECULAR PROBE
Candida species: NOT DETECTED
Gardnerella vaginalis: NOT DETECTED
MICRO NUMBER:: 81184314
SPECIMEN QUALITY:: ADEQUATE
Trichomonas vaginosis: NOT DETECTED

## 2017-10-14 LAB — VITAMIN D 25 HYDROXY (VIT D DEFICIENCY, FRACTURES): Vit D, 25-Hydroxy: 25 ng/mL — ABNORMAL LOW (ref 30–100)

## 2017-10-15 LAB — PAP IG AND HPV HIGH-RISK: HPV DNA HIGH RISK: NOT DETECTED

## 2017-10-16 ENCOUNTER — Encounter: Payer: Self-pay | Admitting: Family Medicine

## 2017-10-19 ENCOUNTER — Encounter: Payer: Self-pay | Admitting: Family Medicine

## 2017-10-20 NOTE — Telephone Encounter (Signed)
[facility] or other [facility] staff helping with referral: please see my comments on the original referral order and refer her a facility that can help her without her having to undergo surgery; I think there are bariatric clinics that will help; if [facility] or other local does not, perhaps Dr. Leafy Ro can see her; thank you   Provider Comments 10/13/2017 8:48 AM Natalie Petersen, Satira Anis, MD Provider Comments -  Note   Not interested in surgery but interested in pills or injectables; please evaluate and treat

## 2017-10-26 ENCOUNTER — Ambulatory Visit (INDEPENDENT_AMBULATORY_CARE_PROVIDER_SITE_OTHER): Payer: 59 | Admitting: Obstetrics and Gynecology

## 2017-10-26 ENCOUNTER — Encounter: Payer: Self-pay | Admitting: Obstetrics and Gynecology

## 2017-10-26 VITALS — BP 124/80 | HR 84 | Ht 69.0 in | Wt 305.0 lb

## 2017-10-26 DIAGNOSIS — N889 Noninflammatory disorder of cervix uteri, unspecified: Secondary | ICD-10-CM | POA: Diagnosis not present

## 2017-10-26 DIAGNOSIS — Z8041 Family history of malignant neoplasm of ovary: Secondary | ICD-10-CM | POA: Diagnosis not present

## 2017-10-26 NOTE — Progress Notes (Signed)
Chief Complaint  Patient presents with  . Referral    friable cx, Dr. Sanda Klein    HPI:      Ms. Natalie Petersen is a 41 y.o. G1P1001 who LMP was Patient's last menstrual period was 10/06/2017 (exact date)., presents today for NP referral for a friable cervix at her 10/13/17 annual with Dr. Sanda Klein (Lada, Satira Anis, MD).  Pap smear showed neg cells, neg HPV DNA, with endocervical component. Had neg gon/chlam/trich too. Pt denies any hx of abnormal paps in the past, no hx of STDs. She is sex active, no issues with bleeding during/after sex. No pelvic pain.  Pt is on OCPs, doing well with them. No side effects. Menses are regular. No tobacco use, no hx of clotting disorders/DVTs, no HTN.  Past Medical History:  Diagnosis Date  . Allergy    seasonal  . Depression   . Menopausal symptoms   . Metrorrhagia   . Vitamin D deficiency     Past Surgical History:  Procedure Laterality Date  . BRAIN SURGERY      Family History  Problem Relation Age of Onset  . Heart murmur Mother   . Atrial fibrillation Mother   . Arthritis Father        RA  . Hyperlipidemia Father   . Cancer Maternal Aunt        ovarian and uterine   . Arthritis Maternal Grandmother   . Heart attack Maternal Grandfather   . Stroke Paternal Grandmother   . Arthritis Paternal Grandmother   . Breast cancer Paternal Grandmother   . Stroke Paternal Uncle     Social History   Socioeconomic History  . Marital status: Married    Spouse name: Not on file  . Number of children: Not on file  . Years of education: Not on file  . Highest education level: Not on file  Social Needs  . Financial resource strain: Not on file  . Food insecurity - worry: Not on file  . Food insecurity - inability: Not on file  . Transportation needs - medical: Not on file  . Transportation needs - non-medical: Not on file  Occupational History  . Not on file  Tobacco Use  . Smoking status: Never Smoker  . Smokeless tobacco: Never Used    Substance and Sexual Activity  . Alcohol use: Yes    Alcohol/week: 0.0 oz    Comment: occasionally red wine  . Drug use: No  . Sexual activity: Yes    Partners: Male    Birth control/protection: Pill  Other Topics Concern  . Not on file  Social History Narrative  . Not on file     Current Outpatient Medications:  .  buPROPion (WELLBUTRIN XL) 300 MG 24 hr tablet, Take 1 tablet (300 mg total) by mouth daily., Disp: 90 tablet, Rfl: 0 .  sertraline (ZOLOFT) 25 MG tablet, Take 1 tablet (25 mg total) by mouth daily., Disp: 90 tablet, Rfl: 0 .  norethindrone-ethinyl estradiol 1/35 ([place] 1/35) tablet, Take 1 tablet by mouth daily., Disp: 30 tablet, Rfl: 0   ROS:  Review of Systems  Constitutional: Negative for fever.  Gastrointestinal: Negative for blood in stool, constipation, diarrhea, nausea and vomiting.  Genitourinary: Negative for dyspareunia, dysuria, flank pain, frequency, hematuria, urgency, vaginal bleeding, vaginal discharge and vaginal pain.  Musculoskeletal: Negative for back pain.  Skin: Negative for rash.     OBJECTIVE:   Vitals:  BP 124/80 (BP Location: Left Arm, Patient Position: Sitting,  Cuff Size: Large)   Pulse 84   Ht 5' 9"  (1.753 m)   Wt (!) 305 lb (138.3 kg)   LMP 10/06/2017 (Exact Date)   BMI 45.04 kg/m   Physical Exam  Constitutional: She is oriented to person, place, and time and well-developed, well-nourished, and in no distress.  Genitourinary: Cervix normal. Cervix exhibits no lesion and no tenderness. Vulva exhibits no erythema, no exudate, no lesion, no rash and no tenderness. Vagina exhibits no exudate. Vaginal discharge found.  Genitourinary Comments: CX NOT FRIABLE TODAY  Neurological: She is alert and oriented to person, place, and time.  Psychiatric: Memory, affect and judgment normal.  Vitals reviewed.   Assessment/Plan: Cervix abnormality - Neg exam, neg pap, neg STD testing. No postcoital bleeding. Reassurance. F/u prn.    Family history of ovarian cancer - MyRisk testing discussed and pt to f/u if desires.    Return if symptoms worsen or fail to improve.  Johnpatrick Jenny B. Lynne Takemoto, PA-C 10/26/2017 9:17 AM

## 2017-10-27 MED ORDER — NORETHINDRONE-ETH ESTRADIOL 1-35 MG-MCG PO TABS: 1.0000 | ORAL_TABLET | Freq: Every day | ORAL | 11 refills | Status: DC

## 2017-10-27 NOTE — Addendum Note (Signed)
Addended by: Yamin Swingler, Satira Anis on: 10/27/2017 12:30 PM   Modules accepted: Orders

## 2017-11-03 DIAGNOSIS — D239 Other benign neoplasm of skin, unspecified: Secondary | ICD-10-CM | POA: Diagnosis not present

## 2017-11-03 DIAGNOSIS — Q828 Other specified congenital malformations of skin: Secondary | ICD-10-CM | POA: Diagnosis not present

## 2017-11-03 DIAGNOSIS — L814 Other melanin hyperpigmentation: Secondary | ICD-10-CM | POA: Diagnosis not present

## 2017-11-05 ENCOUNTER — Telehealth: Payer: 59 | Admitting: Family

## 2017-11-05 DIAGNOSIS — B9689 Other specified bacterial agents as the cause of diseases classified elsewhere: Secondary | ICD-10-CM

## 2017-11-05 DIAGNOSIS — N760 Acute vaginitis: Secondary | ICD-10-CM

## 2017-11-05 MED ORDER — METRONIDAZOLE 500 MG PO TABS: 500.0000 mg | ORAL_TABLET | Freq: Two times a day (BID) | ORAL | 0 refills | Status: DC

## 2017-11-05 NOTE — Progress Notes (Signed)
Thank you for the details you included in the comment boxes. Those details are very helpful in determining the best course of treatment for you and help [place] to provide the best care.These symptoms are more consistent with bacterial vaginosis including the belly pain and mild pain during intercourse. If your symptoms become severe, you may want to call your OBGYN. However, I am confident that the treatment below will help you.  We are sorry that you are not feeling well. Here is how we plan to help! Based on what you shared with me it looks like you: May have a vaginosis due to bacteria  Vaginosis is an inflammation of the vagina that can result in discharge, itching and pain. The cause is usually a change in the normal balance of vaginal bacteria or an infection. Vaginosis can also result from reduced estrogen levels after menopause.  The most common causes of vaginosis are:   Bacterial vaginosis which results from an overgrowth of one on several organisms that are normally present in your vagina.   Yeast infections which are caused by a naturally occurring fungus called candida.   Vaginal atrophy (atrophic vaginosis) which results from the thinning of the vagina from reduced estrogen levels after menopause.   Trichomoniasis which is caused by a parasite and is commonly transmitted by sexual intercourse.  Factors that increase your risk of developing vaginosis include: Marland Kitchen Medications, such as antibiotics and steroids . Uncontrolled diabetes . Use of hygiene products such as bubble bath, vaginal spray or vaginal deodorant . Douching . Wearing damp or tight-fitting clothing . Using an intrauterine device (IUD) for birth control . Hormonal changes, such as those associated with pregnancy, birth control pills or menopause . Sexual activity . Having a sexually transmitted infection  Your treatment plan is Metronidazole or Flagyl 53m twice a day for 7 days.  I have electronically sent this  prescription into the pharmacy that you have chosen.  Be sure to take all of the medication as directed. Stop taking any medication if you develop a rash, tongue swelling or shortness of breath. Mothers who are breast feeding should consider pumping and discarding their breast milk while on these antibiotics. However, there is no consensus that infant exposure at these doses would be harmful.  Remember that medication creams can weaken latex condoms. .Marland Kitchen  HOME CARE:  Good hygiene may prevent some types of vaginosis from recurring and may relieve some symptoms:  . Avoid baths, hot tubs and whirlpool spas. Rinse soap from your outer genital area after a shower, and dry the area well to prevent irritation. Don't use scented or harsh soaps, such as those with deodorant or antibacterial action. .Marland KitchenAvoid irritants. These include scented tampons and pads. . Wipe from front to back after using the toilet. Doing so avoids spreading fecal bacteria to your vagina.  Other things that may help prevent vaginosis include:  .Marland KitchenDon't douche. Your vagina doesn't require cleansing other than normal bathing. Repetitive douching disrupts the normal organisms that reside in the vagina and can actually increase your risk of vaginal infection. Douching won't clear up a vaginal infection. . Use a latex condom. Both female and female latex condoms may help you avoid infections spread by sexual contact. . Wear cotton underwear. Also wear pantyhose with a cotton crotch. If you feel comfortable without it, skip wearing underwear to bed. Yeast thrives in mCampbell SoupYour symptoms should improve in the next day or two.  GET HELP RIGHT AWAY IF:  .  You have pain in your lower abdomen ( pelvic area or over your ovaries) . You develop nausea or vomiting . You develop a fever . Your discharge changes or worsens . You have persistent pain with intercourse . You develop shortness of breath, a rapid pulse, or you  faint.  These symptoms could be signs of problems or infections that need to be evaluated by a medical provider now.  MAKE SURE YOU    Understand these instructions.  Will watch your condition.  Will get help right away if you are not doing well or get worse.  Your e-visit answers were reviewed by a board certified advanced clinical practitioner to complete your personal care plan. Depending upon the condition, your plan could have included both over the counter or prescription medications. Please review your pharmacy choice to make sure that you have choses a pharmacy that is open for you to pick up any needed prescription, Your safety is important to [place]. If you have drug allergies check your prescription carefully.   You can use MyChart to ask questions about today's visit, request a non-urgent call back, or ask for a work or school excuse for 24 hours related to this e-Visit. If it has been greater than 24 hours you will need to follow up with your provider, or enter a new e-Visit to address those concerns. You will get a MyChart message within the next two days asking about your experience. I hope that your e-visit has been valuable and will speed your recovery.

## 2017-11-27 ENCOUNTER — Other Ambulatory Visit: Payer: Self-pay | Admitting: Family Medicine

## 2018-01-05 ENCOUNTER — Encounter: Payer: Self-pay | Admitting: Family Medicine

## 2018-01-20 ENCOUNTER — Encounter: Payer: Self-pay | Admitting: Family Medicine

## 2018-01-20 DIAGNOSIS — F3340 Major depressive disorder, recurrent, in remission, unspecified: Secondary | ICD-10-CM

## 2018-01-21 MED ORDER — BUPROPION HCL ER (XL) 300 MG PO TB24: 300.0000 mg | ORAL_TABLET | Freq: Every day | ORAL | 3 refills | Status: DC

## 2018-01-21 MED ORDER — SERTRALINE HCL 25 MG PO TABS: 25.0000 mg | ORAL_TABLET | Freq: Every day | ORAL | 3 refills | Status: DC

## 2018-01-21 MED ORDER — NORETHINDRONE-ETH ESTRADIOL 1-35 MG-MCG PO TABS: 1.0000 | ORAL_TABLET | Freq: Every day | ORAL | 3 refills | Status: DC

## 2018-01-28 ENCOUNTER — Encounter (INDEPENDENT_AMBULATORY_CARE_PROVIDER_SITE_OTHER): Payer: 59

## 2018-02-11 ENCOUNTER — Ambulatory Visit (INDEPENDENT_AMBULATORY_CARE_PROVIDER_SITE_OTHER): Payer: 59 | Admitting: Family Medicine

## 2018-02-11 ENCOUNTER — Encounter (INDEPENDENT_AMBULATORY_CARE_PROVIDER_SITE_OTHER): Payer: Self-pay | Admitting: Family Medicine

## 2018-02-11 VITALS — BP 168/99 | HR 96 | Temp 98.4°F | Ht 69.0 in | Wt 301.0 lb

## 2018-02-11 DIAGNOSIS — R7303 Prediabetes: Secondary | ICD-10-CM

## 2018-02-11 DIAGNOSIS — Z6841 Body Mass Index (BMI) 40.0 and over, adult: Secondary | ICD-10-CM

## 2018-02-11 DIAGNOSIS — R5383 Other fatigue: Secondary | ICD-10-CM | POA: Diagnosis not present

## 2018-02-11 DIAGNOSIS — I10 Essential (primary) hypertension: Secondary | ICD-10-CM

## 2018-02-11 DIAGNOSIS — Z1331 Encounter for screening for depression: Secondary | ICD-10-CM | POA: Diagnosis not present

## 2018-02-11 DIAGNOSIS — E66813 Obesity, class 3: Secondary | ICD-10-CM

## 2018-02-11 DIAGNOSIS — Z9189 Other specified personal risk factors, not elsewhere classified: Secondary | ICD-10-CM

## 2018-02-11 DIAGNOSIS — R0602 Shortness of breath: Secondary | ICD-10-CM

## 2018-02-11 DIAGNOSIS — Z0289 Encounter for other administrative examinations: Secondary | ICD-10-CM

## 2018-02-11 NOTE — Progress Notes (Signed)
.  Office: [phone]  /  Fax: [phone]   HPI:   Chief Complaint: OBESITY  Natalie Petersen (MR# [id number]) is a 42 y.o. female who presents on 02/11/2018 for obesity evaluation and treatment. Current BMI is Body mass index is 44.45 kg/m.Marland Kitchen Natalie Petersen has struggled with obesity for years and has been unsuccessful in either losing weight or maintaining long term weight loss. She has struggled with weight loss since having her child in 2014. Natalie Petersen found out about our clinic through the [facility] website. She doesn't snack much, but she does crave salty, crunchy carbohydrates.  Natalie Petersen attended our information session and states she is currently in the action stage of change and ready to dedicate time achieving and maintaining a healthier weight.  Natalie Petersen states her family eats meals together she thinks her family will eat healthier with  her her desired weight loss is 135 to 145 lbs she has been heavy most of  her life she started gaining weight at 42 yrs old her heaviest weight ever was 301 lbs. she has significant food cravings issues  she skips meals frequently she is frequently drinking liquids with calories she frequently makes poor food choices she frequently eats larger portions than normal  she has binge eating behaviors she struggles with emotional eating    Fatigue Natalie Petersen feels her energy is lower than it should be. This has worsened with weight gain and has not worsened recently. Natalie Petersen admits to daytime somnolence and  admits to waking up still tired. Patient is at risk for obstructive sleep apnea. Natalie Petersen has a history of symptoms of daytime fatigue and morning fatigue. Patient generally gets 6 or 7 hours of sleep per night, and states they generally have restful sleep. Snoring is present. Apneic episodes is not present. Epworth Sleepiness Score is 5  EKG with concerns for LVH. Natalie Petersen is positive for murmur on exam.  Dyspnea on exertion Natalie Petersen notes increasing shortness of breath with  exercising and seems to be worsening over time with weight gain. She notes getting out of breath sooner with activity than she used to. This has not gotten worse recently. EKG with concerns for LVH. Natalie Petersen is positive for murmur on exam. Natalie Petersen denies orthopnea.  Pre-Diabetes Natalie Petersen was diagnosed with pre-diabetes in 2018. She was told to limit wine and exercise. She was informed this puts her at greater risk of developing diabetes. She is not taking metformin currently and is attempting to work on diet and exercise to decrease risk of diabetes. She denies nausea or hypoglycemia.  At risk for diabetes Natalie Petersen is at higher than average risk for developing diabetes due to her obesity and pre-diabetes. She currently denies polyuria or polydipsia.  Hypertension (white coat) Natalie Petersen is a 42 y.o. female with hypertension. Her blood pressure is elevated today at 168/99. Natalie Petersen states her blood pressure is normal at home and was previously normal at her PCP. Natalie Petersen is on Wellbutrin. Natalie Petersen Natalie Petersen denies chest pain, chest pressure or headache. She is attempting to work on weight loss to help control her blood pressure with the goal of decreasing her risk of heart attack and stroke. Natalie Petersen blood pressure is not currently controlled.  Depression Screen Natalie Petersen's Food and Mood (modified PHQ-9) score was  Depression screen PHQ 2/9 02/11/2018  Decreased Interest 3  Down, Depressed, Hopeless 2  PHQ - 2 Score 5  Altered sleeping 1  Tired, decreased energy 3  Change in appetite 2  Feeling bad or failure about yourself  1  Trouble concentrating 0  Moving slowly or fidgety/restless 0  Suicidal thoughts 0  PHQ-9 Score 12  Difficult doing work/chores Not difficult at all    ALLERGIES: Allergies  Allergen Reactions  . Amoxicillin Hives and Swelling  . Benadryl [Diphenhydramine Hcl (Sleep)] Hives and Swelling    MEDICATIONS: Current Outpatient Medications on File Prior to Visit  Medication Sig Dispense  Refill  . buPROPion (WELLBUTRIN XL) 300 MG 24 hr tablet Take 1 tablet (300 mg total) by mouth daily. 90 tablet 3  . norethindrone-ethinyl estradiol 1/35 ([place] 1/35) tablet Take 1 tablet by mouth daily. 3 Package 3  . sertraline (ZOLOFT) 25 MG tablet Take 1 tablet (25 mg total) by mouth daily. 90 tablet 3   No current facility-administered medications on file prior to visit.     PAST MEDICAL HISTORY: Past Medical History:  Diagnosis Date  . Allergy    seasonal  . Depression   . Menopausal symptoms   . Metrorrhagia   . Vitamin Natalie deficiency     PAST SURGICAL HISTORY: Past Surgical History:  Procedure Laterality Date  . BRAIN SURGERY      SOCIAL HISTORY: Social History   Tobacco Use  . Smoking status: Never Smoker  . Smokeless tobacco: Never Used  Substance Use Topics  . Alcohol use: Yes    Alcohol/week: 0.0 oz    Comment: occasionally red wine  . Drug use: No    FAMILY HISTORY: Family History  Problem Relation Age of Onset  . Heart murmur Mother   . Atrial fibrillation Mother   . Arthritis Father        RA  . Hyperlipidemia Father   . Cancer Maternal Aunt        ovarian and uterine   . Arthritis Maternal Grandmother   . Heart attack Maternal Grandfather   . Stroke Paternal Grandmother   . Arthritis Paternal Grandmother   . Breast cancer Paternal Grandmother   . Stroke Paternal Uncle     ROS: Review of Systems  Constitutional: Positive for malaise/fatigue.  Cardiovascular: Negative for chest pain.       Negative chest pressure   Gastrointestinal: Negative for nausea.  Genitourinary: Negative for frequency.  Skin:       Hair or Nail Changes  Neurological: Negative for headaches.  Endo/Heme/Allergies: Negative for polydipsia.       Negative for hypoglycemia    PHYSICAL EXAM: Blood pressure (!) 168/99, pulse 96, temperature 98.4 F (36.9 C), temperature source Oral, height 5' 9"  (1.753 m), weight (!) 301 lb (136.5 kg), last menstrual period  01/25/2018, SpO2 98 %. Body mass index is 44.45 kg/m. Physical Exam  Constitutional: She is oriented to person, place, and time. She appears well-developed and well-nourished.  HENT:  Head: Normocephalic and atraumatic.  Nose: Nose normal.  Eyes: EOM are normal. No scleral icterus.  Neck: Normal range of motion. Neck supple. No thyromegaly present.  Cardiovascular: Normal rate.  Murmur (best heard at aortic area, non radiating) heard.  Systolic murmur is present with a grade of 2/6. Pulmonary/Chest: Effort normal. No respiratory distress.  Abdominal: Soft. There is no tenderness.  + obesity  Musculoskeletal: Normal range of motion.  Range of Motion normal in all 4 extremities  Neurological: She is alert and oriented to person, place, and time. Coordination normal.  Skin: Skin is warm and dry.  Psychiatric: She has a normal mood and affect. Her behavior is normal.  Vitals reviewed.   RECENT LABS AND TESTS: BMET  Component Value Date/Time   NA 137 10/13/2017 0940   K 4.8 10/13/2017 0940   CL 100 10/13/2017 0940   CO2 27 10/13/2017 0940   GLUCOSE 110 (H) 10/13/2017 0940   BUN 12 10/13/2017 0940   CREATININE 0.83 10/13/2017 0940   CALCIUM 9.3 10/13/2017 0940   GFRNONAA 87 10/13/2017 0940   GFRAA 101 10/13/2017 0940   Lab Results  Component Value Date   HGBA1C 5.8 01/23/2014   No results found for: INSULIN CBC    Component Value Date/Time   WBC 8.6 10/13/2017 0940   RBC 5.17 (H) 10/13/2017 0940   HGB 14.2 10/13/2017 0940   HCT 42.4 10/13/2017 0940   PLT 402 (H) 10/13/2017 0940   MCV 82.0 10/13/2017 0940   MCH 27.5 10/13/2017 0940   MCHC 33.5 10/13/2017 0940   RDW 13.0 10/13/2017 0940   LYMPHSABS 2,434 10/13/2017 0940   MONOABS 284 09/09/2016 0941   EOSABS 86 10/13/2017 0940   BASOSABS 60 10/13/2017 0940   Iron/TIBC/Ferritin/ %Sat No results found for: IRON, TIBC, FERRITIN, IRONPCTSAT Lipid Panel     Component Value Date/Time   CHOL 228 (H) 10/13/2017  0940   TRIG 84 10/13/2017 0940   HDL 83 10/13/2017 0940   CHOLHDL 2.7 10/13/2017 0940   VLDL 20 09/09/2016 0941   LDLCALC 109 09/09/2016 0941   Hepatic Function Panel     Component Value Date/Time   PROT 6.8 10/13/2017 0940   ALBUMIN 4.0 09/09/2016 0941   AST 21 10/13/2017 0940   ALT 29 10/13/2017 0940   ALKPHOS 56 09/09/2016 0941   BILITOT 0.4 10/13/2017 0940      Component Value Date/Time   TSH 4.80 (H) 10/13/2017 0940   Vitamin Natalie   Ref. Range 10/13/2017 09:40  Vitamin Natalie, 25-Hydroxy Latest Ref Range: 30 - 100 ng/mL 25 (L)   ECG  shows NSR with a rate of 97 BPM INDIRECT CALORIMETER done today shows a VO2 of 252 and a REE of 1754. Her calculated basal metabolic rate is 9702 thus her basal metabolic rate is worse than expected.    ASSESSMENT AND PLAN: Other fatigue - Plan: EKG 12-Lead, VITAMIN Natalie 25 Hydroxy (Vit-Natalie Deficiency, Fractures), Vitamin B12, Folate, T3, T4, free, TSH  Shortness of breath on exertion - Plan: ECHOCARDIOGRAM COMPLETE  Prediabetes - Plan: CBC With Differential, Hemoglobin A1c, Insulin, random  Essential hypertension - Plan: Comprehensive metabolic panel, Lipid Panel With LDL/HDL Ratio  Depression screening  At risk for diabetes mellitus  Class 3 severe obesity with serious comorbidity and body mass index (BMI) of 40.0 to 44.9 in adult, unspecified obesity type (HCC)  PLAN:  Fatigue Natalie Petersen was informed that her fatigue may be related to obesity, depression or many other causes. Labs will be ordered, and in the meanwhile Natalie Petersen has agreed to work on diet, exercise and weight loss to help with fatigue. Proper sleep hygiene was discussed including the need for 7-8 hours of quality sleep each night. We will order EKG, indirect calorimetry and echocardiogram ([facility]). A sleep study was not ordered based on symptoms and Epworth score.  Dyspnea on exertion Natalie Petersen's shortness of breath appears to be obesity related and exercise  induced. She has agreed to work on weight loss and gradually increase exercise to treat her exercise induced shortness of breath. If Natalie Petersen follows our instructions and loses weight without improvement of her shortness of breath, we will plan to refer to pulmonology.  We will order EKG, indirect calorimetry, echocardiogram (  Natalie Petersen) and labs. We will monitor this condition regularly. Haylen agrees to this plan.  Pre-Diabetes Zyliah will continue to work on weight loss, exercise, and decreasing simple carbohydrates in her diet to help decrease the risk of diabetes.. She was informed that eating too many simple carbohydrates or too many calories at one sitting increases the likelihood of GI side effects. We will check Hgb A1c and insulin today and Margueritte agreed to follow up with [place] as directed to monitor her progress.  Diabetes risk counseling Rickayla was given extended (15 minutes) diabetes prevention counseling today. She is 42 y.o. female and has risk factors for diabetes including obesity and pre-diabetes. We discussed intensive lifestyle modifications today with an emphasis on weight loss as well as increasing exercise and decreasing simple carbohydrates in her diet.  Hypertension (white coat) We discussed sodium restriction, working on healthy weight loss, and a regular exercise program as the means to achieve improved blood pressure control. Zailyn agreed with this plan and agreed to follow up as directed. We will monitor at her next visit and will continue to monitor her blood pressure as well as her progress with the above lifestyle modifications. She will watch for signs of hypotension as she continues her lifestyle modifications.  Depression Screen Cornell had a moderately positive depression screening. Depression is commonly associated with obesity and often results in emotional eating behaviors. We will monitor this closely and work on CBT to help improve the non-hunger eating  patterns. Referral to Psychology may be required if no improvement is seen as she continues in our clinic.  Obesity Nyelah is currently in the action stage of change and her goal is to continue with weight loss efforts She has agreed to follow the Category 3 plan Emmry has been instructed to work up to a goal of 150 minutes of combined cardio and strengthening exercise per week for weight loss and overall health benefits. We discussed the following Behavioral Modification Strategies today: increase H2O intake, keeping healthy foods in the home, increasing lean protein intake and work on meal planning and easy cooking plans  Taytum has agreed to follow up with our clinic in 2 weeks. She was informed of the importance of frequent follow up visits to maximize her success with intensive lifestyle modifications for her multiple health conditions. She was informed we would discuss her lab results at her next visit unless there is a critical issue that needs to be addressed sooner. Casmira agreed to keep her next visit at the agreed upon time to discuss these results.    OBESITY BEHAVIORAL INTERVENTION VISIT  Today's visit was # 1 out of 22.  Starting weight: 301 lbs Starting date: 02/11/18 Today's weight : 301 lbs  Today's date: 02/11/2018 Total lbs lost to date: 0 (Patients must lose 7 lbs in the first 6 months to continue with counseling)   ASK: We discussed the diagnosis of obesity with Dimple Casey today and Amanat agreed to give [place] permission to discuss obesity behavioral modification therapy today.  ASSESS: Surabhi has the diagnosis of obesity and her BMI today is 44.43 Keelia is in the action stage of change   ADVISE: Sherissa was educated on the multiple health risks of obesity as well as the benefit of weight loss to improve her health. She was advised of the need for long term treatment and the importance of lifestyle modifications.  AGREE: Multiple dietary modification options and  treatment options were discussed and  Ashaya agreed  to the above obesity treatment plan.   I, Doreene Nest, am acting as transcriptionist for Eber Jones, MD    I have reviewed the above documentation for accuracy and completeness, and I agree with the above. - Ilene Qua, MD

## 2018-02-12 LAB — COMPREHENSIVE METABOLIC PANEL
ALBUMIN: 4.1 g/dL (ref 3.5–5.5)
ALT: 13 IU/L (ref 0–32)
AST: 15 IU/L (ref 0–40)
Albumin/Globulin Ratio: 1.5 (ref 1.2–2.2)
Alkaline Phosphatase: 92 IU/L (ref 39–117)
BUN / CREAT RATIO: 12 (ref 9–23)
BUN: 9 mg/dL (ref 6–24)
Bilirubin Total: 0.3 mg/dL (ref 0.0–1.2)
CALCIUM: 9.6 mg/dL (ref 8.7–10.2)
CO2: 22 mmol/L (ref 20–29)
Chloride: 100 mmol/L (ref 96–106)
Creatinine, Ser: 0.77 mg/dL (ref 0.57–1.00)
GFR, EST AFRICAN AMERICAN: 110 mL/min/{1.73_m2} (ref >59)
GFR, EST NON AFRICAN AMERICAN: 96 mL/min/{1.73_m2} (ref >59)
GLOBULIN, TOTAL: 2.8 g/dL (ref 1.5–4.5)
Glucose: 93 mg/dL (ref 65–99)
Potassium: 4.6 mmol/L (ref 3.5–5.2)
SODIUM: 138 mmol/L (ref 134–144)
TOTAL PROTEIN: 6.9 g/dL (ref 6.0–8.5)

## 2018-02-12 LAB — FOLATE: Folate: 6.9 ng/mL (ref >3.0)

## 2018-02-12 LAB — CBC WITH DIFFERENTIAL
BASOS: 0 %
Basophils Absolute: 0 10*3/uL (ref 0.0–0.2)
EOS (ABSOLUTE): 0.1 10*3/uL (ref 0.0–0.4)
Eos: 1 %
Hematocrit: 40.2 % (ref 34.0–46.6)
Hemoglobin: 13.7 g/dL (ref 11.1–15.9)
IMMATURE GRANS (ABS): 0 10*3/uL (ref 0.0–0.1)
Immature Granulocytes: 0 %
LYMPHS ABS: 2.6 10*3/uL (ref 0.7–3.1)
LYMPHS: 24 %
MCH: 27.7 pg (ref 26.6–33.0)
MCHC: 34.1 g/dL (ref 31.5–35.7)
MCV: 81 fL (ref 79–97)
MONOS ABS: 0.5 10*3/uL (ref 0.1–0.9)
Monocytes: 5 %
NEUTROS ABS: 7.5 10*3/uL — AB (ref 1.4–7.0)
NEUTROS PCT: 70 %
RBC: 4.94 x10E6/uL (ref 3.77–5.28)
RDW: 14.5 % (ref 12.3–15.4)
WBC: 10.8 10*3/uL (ref 3.4–10.8)

## 2018-02-12 LAB — TSH: TSH: 3.64 u[IU]/mL (ref 0.450–4.500)

## 2018-02-12 LAB — HEMOGLOBIN A1C
Est. average glucose Bld gHb Est-mCnc: 111 mg/dL
Hgb A1c MFr Bld: 5.5 % (ref 4.8–5.6)

## 2018-02-12 LAB — LIPID PANEL WITH LDL/HDL RATIO
CHOLESTEROL TOTAL: 199 mg/dL (ref 100–199)
HDL: 71 mg/dL (ref >39)
LDL CALC: 104 mg/dL — AB (ref 0–99)
LDl/HDL Ratio: 1.5 ratio (ref 0.0–3.2)
TRIGLYCERIDES: 121 mg/dL (ref 0–149)
VLDL CHOLESTEROL CAL: 24 mg/dL (ref 5–40)

## 2018-02-12 LAB — T3: T3 TOTAL: 155 ng/dL (ref 71–180)

## 2018-02-12 LAB — T4, FREE: Free T4: 1.02 ng/dL (ref 0.82–1.77)

## 2018-02-12 LAB — INSULIN, RANDOM: INSULIN: 16.9 u[IU]/mL (ref 2.6–24.9)

## 2018-02-12 LAB — VITAMIN B12: VITAMIN B 12: 269 pg/mL (ref 232–1245)

## 2018-02-12 LAB — VITAMIN D 25 HYDROXY (VIT D DEFICIENCY, FRACTURES): Vit D, 25-Hydroxy: 13.5 ng/mL — ABNORMAL LOW (ref 30.0–100.0)

## 2018-02-24 ENCOUNTER — Ambulatory Visit (INDEPENDENT_AMBULATORY_CARE_PROVIDER_SITE_OTHER): Payer: 59 | Admitting: Family Medicine

## 2018-02-24 VITALS — BP 170/113 | HR 89 | Temp 98.4°F | Ht 69.0 in | Wt 297.0 lb

## 2018-02-24 DIAGNOSIS — Z6841 Body Mass Index (BMI) 40.0 and over, adult: Secondary | ICD-10-CM

## 2018-02-24 DIAGNOSIS — R7303 Prediabetes: Secondary | ICD-10-CM

## 2018-02-24 DIAGNOSIS — E559 Vitamin D deficiency, unspecified: Secondary | ICD-10-CM

## 2018-02-24 DIAGNOSIS — Z9189 Other specified personal risk factors, not elsewhere classified: Secondary | ICD-10-CM

## 2018-02-24 DIAGNOSIS — I10 Essential (primary) hypertension: Secondary | ICD-10-CM

## 2018-02-24 DIAGNOSIS — E66813 Obesity, class 3: Secondary | ICD-10-CM

## 2018-02-24 MED ORDER — VITAMIN D (ERGOCALCIFEROL) 1.25 MG (50000 UNIT) PO CAPS: 50000.0000 [IU] | ORAL_CAPSULE | ORAL | 0 refills | Status: DC

## 2018-02-24 MED ORDER — LISINOPRIL 10 MG PO TABS
10.0000 mg | ORAL_TABLET | Freq: Every day | ORAL | 0 refills | Status: DC
Start: 2018-02-24 — End: 2018-03-15

## 2018-02-24 NOTE — Progress Notes (Signed)
Office: [phone]  /  Fax: [phone]   HPI:   Chief Complaint: OBESITY Natalie Petersen is here to discuss her progress with her obesity treatment plan. She is on the Category 3 plan and is following her eating plan approximately 70 % of the time. She states she is walking 15 minutes 3 times per week. Natalie Petersen went away for the weekend, so she strayed from her meal plan at dinner. She liked the structure of the meal plan. She didn't have any significant cravings or hunger. Her weight is 297 lb (134.7 kg) today and has had a weight loss of 4 pounds over a period of 2 weeks since her last visit. She has lost 4 lbs since starting treatment with [place].  Vitamin D deficiency Natalie Petersen has a diagnosis of vitamin D deficiency. Her vitamin D level is at 13.5, and was at 25 previously. She is not on OTC vit D supplement. She admits fatigue and denies nausea, vomiting or muscle weakness.   Ref. Range 02/11/2018 11:48  Vitamin D, 25-Hydroxy Latest Ref Range: 30.0 - 100.0 ng/mL 13.5 (L)   Pre-Diabetes Natalie Petersen has a diagnosis of pre-diabetes based on her elevated Hgb A1c at 5.5 and fasting insulin level at 16.9 and was informed this puts her at greater risk of developing diabetes. She is not taking metformin currently and continues to work on diet and exercise to decrease risk of diabetes. She admits occasional hypoglycemic events.  Hypertension Patient denies chest pain, chest pressure, headache.  BP significantly elevated again today.  Previously no diagnosis of HTN and blood pressure controlled at last visit to physician's office in November of 2018. On Wellbutrin but has been on it for some time.  States she has a history of white coat hypertension.  At risk for diabetes Natalie Petersen is at higher than average risk for developing diabetes due to her obesity and pre-diabetes. She currently denies polyuria or polydipsia.  ALLERGIES: Allergies  Allergen Reactions  . Amoxicillin Hives and Swelling  . Benadryl  [Diphenhydramine Hcl (Sleep)] Hives and Swelling    MEDICATIONS: Current Outpatient Medications on File Prior to Visit  Medication Sig Dispense Refill  . buPROPion (WELLBUTRIN XL) 300 MG 24 hr tablet Take 1 tablet (300 mg total) by mouth daily. 90 tablet 3  . sertraline (ZOLOFT) 25 MG tablet Take 1 tablet (25 mg total) by mouth daily. 90 tablet 3  . norethindrone-ethinyl estradiol 1/35 ([place] 1/35) tablet Take 1 tablet by mouth daily. 3 Package 3   No current facility-administered medications on file prior to visit.     PAST MEDICAL HISTORY: Past Medical History:  Diagnosis Date  . Allergy    seasonal  . Depression   . Menopausal symptoms   . Metrorrhagia   . Vitamin D deficiency     PAST SURGICAL HISTORY: Past Surgical History:  Procedure Laterality Date  . BRAIN SURGERY      SOCIAL HISTORY: Social History   Tobacco Use  . Smoking status: Never Smoker  . Smokeless tobacco: Never Used  Substance Use Topics  . Alcohol use: Yes    Alcohol/week: 0.0 oz    Comment: occasionally red wine  . Drug use: No    FAMILY HISTORY: Family History  Problem Relation Age of Onset  . Heart murmur Mother   . Atrial fibrillation Mother   . Arthritis Father        RA  . Hyperlipidemia Father   . Cancer Maternal Aunt        ovarian and  uterine   . Arthritis Maternal Grandmother   . Heart attack Maternal Grandfather   . Stroke Paternal Grandmother   . Arthritis Paternal Grandmother   . Breast cancer Paternal Grandmother   . Stroke Paternal Uncle     ROS: Review of Systems  Constitutional: Positive for malaise/fatigue and weight loss.  Gastrointestinal: Negative for nausea and vomiting.  Genitourinary: Negative for frequency.  Musculoskeletal:       Negative for muscle weakness  Endo/Heme/Allergies: Negative for polydipsia.    PHYSICAL EXAM: Blood pressure (!) 170/113, pulse 89, temperature 98.4 F (36.9 C), temperature source Oral, height 5' 9"  (1.753 m), weight  297 lb (134.7 kg), last menstrual period 01/25/2018, SpO2 97 %. Body mass index is 43.86 kg/m. Physical Exam  Constitutional: She is oriented to person, place, and time. She appears well-developed and well-nourished.  Cardiovascular: Normal rate.  Pulmonary/Chest: Effort normal.  Musculoskeletal: Normal range of motion.  Neurological: She is oriented to person, place, and time.  Skin: Skin is warm and dry.  Psychiatric: She has a normal mood and affect. Her behavior is normal.  Vitals reviewed.   RECENT LABS AND TESTS: BMET    Component Value Date/Time   NA 138 02/11/2018 1148   K 4.6 02/11/2018 1148   CL 100 02/11/2018 1148   CO2 22 02/11/2018 1148   GLUCOSE 93 02/11/2018 1148   GLUCOSE 110 (H) 10/13/2017 0940   BUN 9 02/11/2018 1148   CREATININE 0.77 02/11/2018 1148   CREATININE 0.83 10/13/2017 0940   CALCIUM 9.6 02/11/2018 1148   GFRNONAA 96 02/11/2018 1148   GFRNONAA 87 10/13/2017 0940   GFRAA 110 02/11/2018 1148   GFRAA 101 10/13/2017 0940   Lab Results  Component Value Date   HGBA1C 5.5 02/11/2018   HGBA1C 5.8 01/23/2014   Lab Results  Component Value Date   INSULIN 16.9 02/11/2018   CBC    Component Value Date/Time   WBC 10.8 02/11/2018 1148   WBC 8.6 10/13/2017 0940   RBC 4.94 02/11/2018 1148   RBC 5.17 (H) 10/13/2017 0940   HGB 13.7 02/11/2018 1148   HCT 40.2 02/11/2018 1148   PLT 402 (H) 10/13/2017 0940   MCV 81 02/11/2018 1148   MCH 27.7 02/11/2018 1148   MCH 27.5 10/13/2017 0940   MCHC 34.1 02/11/2018 1148   MCHC 33.5 10/13/2017 0940   RDW 14.5 02/11/2018 1148   LYMPHSABS 2.6 02/11/2018 1148   MONOABS 284 09/09/2016 0941   EOSABS 0.1 02/11/2018 1148   BASOSABS 0.0 02/11/2018 1148   Iron/TIBC/Ferritin/ %Sat No results found for: IRON, TIBC, FERRITIN, IRONPCTSAT Lipid Panel     Component Value Date/Time   CHOL 199 02/11/2018 1148   TRIG 121 02/11/2018 1148   HDL 71 02/11/2018 1148   CHOLHDL 2.7 10/13/2017 0940   VLDL 20 09/09/2016 0941    LDLCALC 104 (H) 02/11/2018 1148   Hepatic Function Panel     Component Value Date/Time   PROT 6.9 02/11/2018 1148   ALBUMIN 4.1 02/11/2018 1148   AST 15 02/11/2018 1148   ALT 13 02/11/2018 1148   ALKPHOS 92 02/11/2018 1148   BILITOT 0.3 02/11/2018 1148      Component Value Date/Time   TSH 3.640 02/11/2018 1148   TSH 4.80 (H) 10/13/2017 0940   TSH 4.49 09/09/2016 0941    Ref. Range 02/11/2018 11:48  Vitamin D, 25-Hydroxy Latest Ref Range: 30.0 - 100.0 ng/mL 13.5 (L)   ASSESSMENT AND PLAN: Vitamin D deficiency - Plan: Vitamin D, Ergocalciferol, (  DRISDOL) 50000 units CAPS capsule  Pre-diabetes  At risk for diabetes mellitus  Class 3 severe obesity with serious comorbidity and body mass index (BMI) of 40.0 to 44.9 in adult, unspecified obesity type ([place])  PLAN:  Vitamin D Deficiency Natalie Petersen was informed that low vitamin D levels contributes to fatigue and are associated with obesity, breast, and colon cancer. She agrees to start to take prescription Vit D @50 ,000 IU every week #4 with no refills and will follow up for routine testing of vitamin D, at least 2-3 times per year. She was informed of the risk of over-replacement of vitamin D and agrees to not increase her dose unless she discusses this with [place] first. Natalie Petersen agrees to follow up with our clinic in 2 weeks.  Pre-Diabetes Natalie Petersen will continue to work on weight loss, exercise, and decreasing simple carbohydrates in her diet to help decrease the risk of diabetes. She was informed that eating too many simple carbohydrates or too many calories at one sitting increases the likelihood of GI side effects. Natalie Petersen will continue with the category 3 plan and follow up with [place] as directed to monitor her progress.  Diabetes risk counseling Natalie Petersen was given extended (30 minutes) diabetes prevention counseling today. She is 42 y.o. female and has risk factors for diabetes including obesity and pre-diabetes. We discussed intensive lifestyle  modifications today with an emphasis on weight loss as well as increasing exercise and decreasing simple carbohydrates in her diet.  Hypertension Making changes in diet and will continue to follow Category 3 meal plan.  Patient not having any symptoms from elevated blood pressure but pressure has increased substantially since November 2018. Patient reports she takes her blood pressure regularly at home and normal systolic is less than 174.  She agrees to start tracking her blood pressure and bring her blood pressure cuff with her to her next appointment.  Will start Lisinopril 6m PO daily #30 with no refills and follow up on blood pressure at next visit.  No signs or symptoms of hypertensive urgency at this time. May need to consider lowering dose of Wellbutrin if blood pressure remains elevated.   Obesity Natalie Petersen is currently in the action stage of change. As such, her goal is to continue with weight loss efforts She has agreed to follow the Category 3 plan Natalie Petersen has been instructed to work up to a goal of 150 minutes of combined cardio and strengthening exercise per week for weight loss and overall health benefits. We discussed the following Behavioral Modification Strategies today: planning for success, increasing lean protein intake, increasing vegetables and work on meal planning and easy cooking plans  Natalie Petersen agreed to follow up with our clinic in 2 weeks. She was informed of the importance of frequent follow up visits to maximize her success with intensive lifestyle modifications for her multiple health conditions.   OBESITY BEHAVIORAL INTERVENTION VISIT  Today's visit was # 2 out of 22.  Starting weight: 301 lbs Starting date: 02/11/18 Today's weight : 297 lbs  Today's date: 02/24/2018 Total lbs lost to date: 4 (Patients must lose 7 lbs in the first 6 months to continue with counseling)   ASK: We discussed the diagnosis of obesity with MDimple Caseytoday and Natalie Petersen agreed to give  [place] to discuss obesity behavioral modification therapy today.  ASSESS: Natalie Petersen has the diagnosis of obesity and her BMI today is 43.84 Shayle is in the action stage of change   ADVISE: Natalie Petersen educated on  the multiple health risks of obesity as well as the benefit of weight loss to improve her health. She was advised of the need for long term treatment and the importance of lifestyle modifications.  AGREE: Multiple dietary modification options and treatment options were discussed and  Natalie Petersen agreed to the above obesity treatment plan.  I, Doreene Nest, am acting as transcriptionist for Eber Jones, MD  I have reviewed the above documentation for accuracy and completeness, and I agree with the above. - Ilene Qua, MD

## 2018-02-25 ENCOUNTER — Other Ambulatory Visit: Payer: Self-pay

## 2018-02-25 ENCOUNTER — Ambulatory Visit (HOSPITAL_COMMUNITY): Payer: 59 | Attending: Cardiology

## 2018-02-25 DIAGNOSIS — R0602 Shortness of breath: Secondary | ICD-10-CM | POA: Diagnosis not present

## 2018-03-10 ENCOUNTER — Ambulatory Visit (INDEPENDENT_AMBULATORY_CARE_PROVIDER_SITE_OTHER): Payer: 59 | Admitting: Family Medicine

## 2018-03-10 VITALS — BP 170/100 | HR 86 | Temp 98.4°F | Ht 69.0 in | Wt 292.0 lb

## 2018-03-10 DIAGNOSIS — F3289 Other specified depressive episodes: Secondary | ICD-10-CM

## 2018-03-10 DIAGNOSIS — I10 Essential (primary) hypertension: Secondary | ICD-10-CM | POA: Diagnosis not present

## 2018-03-10 DIAGNOSIS — E559 Vitamin D deficiency, unspecified: Secondary | ICD-10-CM | POA: Diagnosis not present

## 2018-03-10 DIAGNOSIS — Z6841 Body Mass Index (BMI) 40.0 and over, adult: Secondary | ICD-10-CM | POA: Diagnosis not present

## 2018-03-10 NOTE — Progress Notes (Signed)
Office: [phone]  /  Fax: [phone]   HPI:   Chief Complaint: OBESITY Davy is here to discuss her progress with her obesity treatment plan. She is on the Category 3 plan and is following her eating plan approximately 95 % of the time. She states she is walking for 20 minutes 3 times per week. Kellin is doing really well on Category 3, she started walking with her son. Getting in most of protein on Category 3, starting to get tired of lunch meat.  Her weight is 292 lb (132.5 kg) today and has had a weight loss of 5 pounds over a period of 2 weeks since her last visit. She has lost 9 lbs since starting treatment with [place].  Hypertension Averil D Maresh is a 42 y.o. female with hypertension. Buffey brought blood pressure cuff from home with fluctuating blood pressure from 140's/90's to 170's/100's. She denies chest pain, chest pressure, or headache. She is working weight loss to help control her blood pressure with the goal of decreasing her risk of heart attack and stroke. Deisi's blood pressure is not currently controlled.  Vitamin D Deficiency Tu has a diagnosis of vitamin D deficiency. She is currently taking prescription Vit D and denies nausea, vomiting or muscle weakness.  Depression with emotional eating behaviors Taylar is on Wellbutrin and Zoloft, blood pressure elevated. Nikeia struggles with emotional eating and using food for comfort to the extent that it is negatively impacting her health. She often snacks when she is not hungry. Jaine sometimes feels she is out of control and then feels guilty that she made poor food choices. She has been working on behavior modification techniques to help reduce her emotional eating and has been somewhat successful. She shows no sign of suicidal or homicidal ideations.  Depression screen [facility] 2/9 02/11/2018 10/13/2017 09/09/2016 07/16/2015  Decreased Interest 3 0 0 0  Down, Depressed, Hopeless 2 0 0 0  PHQ - 2 Score 5 0 0 0  Altered sleeping 1 -  - -  Tired, decreased energy 3 - - -  Change in appetite 2 - - -  Feeling bad or failure about yourself  1 - - -  Trouble concentrating 0 - - -  Moving slowly or fidgety/restless 0 - - -  Suicidal thoughts 0 - - -  PHQ-9 Score 12 - - -  Difficult doing work/chores Not difficult at all - - -   ALLERGIES: Allergies  Allergen Reactions  . Amoxicillin Hives and Swelling  . Benadryl [Diphenhydramine Hcl (Sleep)] Hives and Swelling    MEDICATIONS: Current Outpatient Medications on File Prior to Visit  Medication Sig Dispense Refill  . buPROPion (WELLBUTRIN XL) 300 MG 24 hr tablet Take 1 tablet (300 mg total) by mouth daily. 90 tablet 3  . lisinopril (PRINIVIL,ZESTRIL) 10 MG tablet Take 1 tablet (10 mg total) by mouth daily. 30 tablet 0  . sertraline (ZOLOFT) 25 MG tablet Take 1 tablet (25 mg total) by mouth daily. 90 tablet 3  . Vitamin D, Ergocalciferol, (DRISDOL) 50000 units CAPS capsule Take 1 capsule (50,000 Units total) by mouth every 7 (seven) days. 4 capsule 0  . norethindrone-ethinyl estradiol 1/35 ([place] 1/35) tablet Take 1 tablet by mouth daily. 3 Package 3   No current facility-administered medications on file prior to visit.     PAST MEDICAL HISTORY: Past Medical History:  Diagnosis Date  . Allergy    seasonal  . Depression   . Menopausal symptoms   . Metrorrhagia   .  Vitamin D deficiency     PAST SURGICAL HISTORY: Past Surgical History:  Procedure Laterality Date  . BRAIN SURGERY      SOCIAL HISTORY: Social History   Tobacco Use  . Smoking status: Never Smoker  . Smokeless tobacco: Never Used  Substance Use Topics  . Alcohol use: Yes    Alcohol/week: 0.0 oz    Comment: occasionally red wine  . Drug use: No    FAMILY HISTORY: Family History  Problem Relation Age of Onset  . Heart murmur Mother   . Atrial fibrillation Mother   . Arthritis Father        RA  . Hyperlipidemia Father   . Cancer Maternal Aunt        ovarian and uterine   .  Arthritis Maternal Grandmother   . Heart attack Maternal Grandfather   . Stroke Paternal Grandmother   . Arthritis Paternal Grandmother   . Breast cancer Paternal Grandmother   . Stroke Paternal Uncle     ROS: Review of Systems  Constitutional: Positive for weight loss.  Cardiovascular: Negative for chest pain.       Negative chest pressure  Gastrointestinal: Negative for nausea and vomiting.  Musculoskeletal:       Negative muscle weakness  Neurological: Negative for headaches.  Psychiatric/Behavioral: Positive for depression. Negative for suicidal ideas.    PHYSICAL EXAM: Blood pressure (!) 170/100, pulse 86, temperature 98.4 F (36.9 C), temperature source Oral, height 5' 9"  (1.753 m), weight 292 lb (132.5 kg), SpO2 98 %. Body mass index is 43.12 kg/m. Physical Exam  Constitutional: She is oriented to person, place, and time. She appears well-developed and well-nourished.  Cardiovascular: Normal rate.  Pulmonary/Chest: Effort normal.  Musculoskeletal: Normal range of motion.  Neurological: She is oriented to person, place, and time.  Skin: Skin is warm and dry.  Psychiatric: She has a normal mood and affect. Her behavior is normal.  Vitals reviewed.   RECENT LABS AND TESTS: BMET    Component Value Date/Time   NA 138 02/11/2018 1148   K 4.6 02/11/2018 1148   CL 100 02/11/2018 1148   CO2 22 02/11/2018 1148   GLUCOSE 93 02/11/2018 1148   GLUCOSE 110 (H) 10/13/2017 0940   BUN 9 02/11/2018 1148   CREATININE 0.77 02/11/2018 1148   CREATININE 0.83 10/13/2017 0940   CALCIUM 9.6 02/11/2018 1148   GFRNONAA 96 02/11/2018 1148   GFRNONAA 87 10/13/2017 0940   GFRAA 110 02/11/2018 1148   GFRAA 101 10/13/2017 0940   Lab Results  Component Value Date   HGBA1C 5.5 02/11/2018   HGBA1C 5.8 01/23/2014   Lab Results  Component Value Date   INSULIN 16.9 02/11/2018   CBC    Component Value Date/Time   WBC 10.8 02/11/2018 1148   WBC 8.6 10/13/2017 0940   RBC 4.94  02/11/2018 1148   RBC 5.17 (H) 10/13/2017 0940   HGB 13.7 02/11/2018 1148   HCT 40.2 02/11/2018 1148   PLT 402 (H) 10/13/2017 0940   MCV 81 02/11/2018 1148   MCH 27.7 02/11/2018 1148   MCH 27.5 10/13/2017 0940   MCHC 34.1 02/11/2018 1148   MCHC 33.5 10/13/2017 0940   RDW 14.5 02/11/2018 1148   LYMPHSABS 2.6 02/11/2018 1148   MONOABS 284 09/09/2016 0941   EOSABS 0.1 02/11/2018 1148   BASOSABS 0.0 02/11/2018 1148   Iron/TIBC/Ferritin/ %Sat No results found for: IRON, TIBC, FERRITIN, IRONPCTSAT Lipid Panel     Component Value Date/Time   CHOL 199  02/11/2018 1148   TRIG 121 02/11/2018 1148   HDL 71 02/11/2018 1148   CHOLHDL 2.7 10/13/2017 0940   VLDL 20 09/09/2016 0941   LDLCALC 104 (H) 02/11/2018 1148   LDLCALC 127 (H) 10/13/2017 0940   Hepatic Function Panel     Component Value Date/Time   PROT 6.9 02/11/2018 1148   ALBUMIN 4.1 02/11/2018 1148   AST 15 02/11/2018 1148   ALT 13 02/11/2018 1148   ALKPHOS 92 02/11/2018 1148   BILITOT 0.3 02/11/2018 1148      Component Value Date/Time   TSH 3.640 02/11/2018 1148   TSH 4.80 (H) 10/13/2017 0940   TSH 4.49 09/09/2016 0941  Results for TAKAKO, MINCKLER (MRN [id number]) as of 03/10/2018 17:12  Ref. Range 02/11/2018 11:48  Vitamin D, 25-Hydroxy Latest Ref Range: 30.0 - 100.0 ng/mL 13.5 (L)    ASSESSMENT AND PLAN: Essential hypertension  Vitamin D deficiency  Other depression - with emotional eating  Class 3 severe obesity with serious comorbidity and body mass index (BMI) of 40.0 to 44.9 in adult, unspecified obesity type ([place])  PLAN:  Hypertension We discussed sodium restriction, working on healthy weight loss, and a regular exercise program as the means to achieve improved blood pressure control. Maryum agreed with this plan and agreed to follow up as directed. We will continue to monitor her blood pressure as well as her progress with the above lifestyle modifications. Genny agrees to start lisinopril 10 mg PO daily #30  with no refills. She will watch for signs of hypotension as she continues her lifestyle modifications. Shatana agrees to follow up with our clinic in 2 weeks.  Vitamin D Deficiency [place] was informed that low vitamin D levels contributes to fatigue and are associated with obesity, breast, and colon cancer. Keaunna agrees to continue taking prescription Vit D @50 ,000 IU every week #4, no refill needed. She will follow up for routine testing of vitamin D, at least 2-3 times per year. She was informed of the risk of over-replacement of vitamin D and agrees to not increase her dose unless she discusses this with [place] first. Clariece agrees to follow up with our clinic in 2 weeks.  Depression with Emotional Eating Behaviors We discussed behavior modification techniques today to help Olu[place]obi deal with her emotional eating and depression. Azyriah will continue current medications, if blood pressure remains elevated with Wellbutrin we will try to decrease Wellbutrin to 200 mg. Martina agrees to follow up with our clinic in 2 weeks.  We spent > than 50% of the 15 minute visit on the counseling as documented in the note.  Obesity Alinna is currently in the action stage of change. As such, her goal is to continue with weight loss efforts She has agreed to follow the Category 3 plan Frances has been instructed to work up to a goal of 150 minutes of combined cardio and strengthening exercise per week for weight loss and overall health benefits. We discussed the following Behavioral Modification Strategies today: increasing lean protein intake, work on meal planning and easy cooking plans, increase H20 intake, and keeping healthy foods in the home   Chantel has agreed to follow up with our clinic in 2 weeks. She was informed of the importance of frequent follow up visits to maximize her success with intensive lifestyle modifications for her multiple health conditions.   OBESITY BEHAVIORAL INTERVENTION VISIT  Today's visit was  # 3 out of 22.  Starting weight: 301 lbs Starting date: 02/11/18 Today's weight :  292 lbs Today's date: 03/10/2018 Total lbs lost to date: 9 (Patients must lose 7 lbs in the first 6 months to continue with counseling)   ASK: We discussed the diagnosis of obesity with Dimple Casey today and Taniah agreed to give [place] permission to discuss obesity behavioral modification therapy today.  ASSESS: Julienne has the diagnosis of obesity and her BMI today is 43.1 Shonia is in the action stage of change   ADVISE: Tammela was educated on the multiple health risks of obesity as well as the benefit of weight loss to improve her health. She was advised of the need for long term treatment and the importance of lifestyle modifications.  AGREE: Multiple dietary modification options and treatment options were discussed and  Moira agreed to the above obesity treatment plan.  I, Trixie Dredge, am acting as transcriptionist for Ilene Qua, MD  I have reviewed the above documentation for accuracy and completeness, and I agree with the above. - Ilene Qua, MD

## 2018-03-13 ENCOUNTER — Emergency Department
Admission: EM | Admit: 2018-03-13 | Discharge: 2018-03-14 | Disposition: A | Discharge status: Home / Self Care | Payer: 59 | Source: Home / Self Care | Attending: Emergency Medicine | Admitting: Emergency Medicine

## 2018-03-13 ENCOUNTER — Encounter: Payer: Self-pay | Admitting: Emergency Medicine

## 2018-03-13 DIAGNOSIS — Z79899 Other long term (current) drug therapy: Secondary | ICD-10-CM | POA: Diagnosis not present

## 2018-03-13 DIAGNOSIS — Z79890 Hormone replacement therapy (postmenopausal): Secondary | ICD-10-CM | POA: Diagnosis not present

## 2018-03-13 DIAGNOSIS — E559 Vitamin D deficiency, unspecified: Secondary | ICD-10-CM | POA: Diagnosis not present

## 2018-03-13 DIAGNOSIS — T7840XA Allergy, unspecified, initial encounter: Secondary | ICD-10-CM | POA: Diagnosis not present

## 2018-03-13 DIAGNOSIS — Z880 Allergy status to penicillin: Secondary | ICD-10-CM | POA: Diagnosis not present

## 2018-03-13 DIAGNOSIS — N951 Menopausal and female climacteric states: Secondary | ICD-10-CM | POA: Diagnosis not present

## 2018-03-13 DIAGNOSIS — T783XXA Angioneurotic edema, initial encounter: Secondary | ICD-10-CM | POA: Diagnosis not present

## 2018-03-13 DIAGNOSIS — F329 Major depressive disorder, single episode, unspecified: Secondary | ICD-10-CM | POA: Diagnosis not present

## 2018-03-13 DIAGNOSIS — I10 Essential (primary) hypertension: Secondary | ICD-10-CM | POA: Diagnosis not present

## 2018-03-13 DIAGNOSIS — R220 Localized swelling, mass and lump, head: Secondary | ICD-10-CM | POA: Diagnosis not present

## 2018-03-13 DIAGNOSIS — Z888 Allergy status to other drugs, medicaments and biological substances status: Secondary | ICD-10-CM | POA: Diagnosis not present

## 2018-03-13 DIAGNOSIS — T464X5A Adverse effect of angiotensin-converting-enzyme inhibitors, initial encounter: Secondary | ICD-10-CM | POA: Diagnosis not present

## 2018-03-13 MED ORDER — FAMOTIDINE IN NACL 20-0.9 MG/50ML-% IV SOLN
INTRAVENOUS | Status: AC
  Administered 2018-03-13: 20 mg via INTRAVENOUS
  Filled 2018-03-13: qty 50

## 2018-03-13 MED ORDER — METHYLPREDNISOLONE SODIUM SUCC 125 MG IJ SOLR
125.0000 mg | Freq: Once | INTRAMUSCULAR | Status: AC
  Administered 2018-03-13: 125 mg via INTRAVENOUS

## 2018-03-13 MED ORDER — SODIUM CHLORIDE 0.9 % IV BOLUS (SEPSIS)
1000.0000 mL | Freq: Once | INTRAVENOUS | Status: AC
  Administered 2018-03-13: 1000 mL via INTRAVENOUS

## 2018-03-13 MED ORDER — METHYLPREDNISOLONE SODIUM SUCC 125 MG IJ SOLR
INTRAMUSCULAR | Status: AC
  Administered 2018-03-13: 125 mg via INTRAVENOUS
  Filled 2018-03-13: qty 2

## 2018-03-13 MED ORDER — FAMOTIDINE IN NACL 20-0.9 MG/50ML-% IV SOLN
20.0000 mg | Freq: Once | INTRAVENOUS | Status: AC
Start: 2018-03-13 — End: 2018-03-13
  Administered 2018-03-13: 20 mg via INTRAVENOUS

## 2018-03-13 NOTE — ED Provider Notes (Signed)
[facility] Emergency Department Provider Note  Time seen: 9:32 PM  I have reviewed the triage vital signs and the nursing notes.   HISTORY  Chief Complaint Facial swelling   HPI Natalie Petersen is a 42 y.o. female with a past medical history of depression, hypertension, presents to the emergency department with oral swelling.  According to the patient at approximately 12 PM today she noticed swelling of her tongue lips and face.  She went to urgent care was given prednisone.  States she continued to worsen throughout the afternoon so she came to the emergency department for evaluation.  Denies any new food exposures.  States she did recently start taking lisinopril 3 days ago for high blood pressure.  Largely negative review of systems otherwise.  Currently patient appears well, moderate facial edema but no distress.  No difficulty breathing.   Past Medical History:  Diagnosis Date  . Allergy    seasonal  . Depression   . Menopausal symptoms   . Metrorrhagia   . Vitamin D deficiency     Patient Active Problem List   Diagnosis Date Noted  . Family history of ovarian cancer 10/26/2017  . Cervical cancer screening 09/09/2016  . Abnormal mammogram of left breast 09/04/2015  . Obesity, Class III, BMI 40-49.9 (morbid obesity) ([place]) 07/16/2015  . Depression, major, recurrent, in remission ([place]) 07/16/2015  . Vitamin D deficiency 07/16/2015  . Oral contraceptive pill surveillance 07/16/2015  . Annual physical exam 07/16/2015  . Breast cancer screening 07/16/2015    Past Surgical History:  Procedure Laterality Date  . BRAIN SURGERY      Prior to Admission medications   Medication Sig Start Date End Date Taking? Authorizing Provider  buPROPion (WELLBUTRIN XL) 300 MG 24 hr tablet Take 1 tablet (300 mg total) by mouth daily. 01/21/18   Arnetha Courser, MD  lisinopril (PRINIVIL,ZESTRIL) 10 MG tablet Take 1 tablet (10 mg total) by mouth daily. 02/24/18   Eber Jones, MD  norethindrone-ethinyl estradiol 1/35 [facility] 1/35) tablet Take 1 tablet by mouth daily. 01/21/18 02/20/18  Arnetha Courser, MD  sertraline (ZOLOFT) 25 MG tablet Take 1 tablet (25 mg total) by mouth daily. 01/21/18   Arnetha Courser, MD  Vitamin D, Ergocalciferol, (DRISDOL) 50000 units CAPS capsule Take 1 capsule (50,000 Units total) by mouth every 7 (seven) days. 02/24/18   Eber Jones, MD    Allergies  Allergen Reactions  . Amoxicillin Hives and Swelling  . Benadryl [Diphenhydramine Hcl (Sleep)] Hives and Swelling    Family History  Problem Relation Age of Onset  . Heart murmur Mother   . Atrial fibrillation Mother   . Arthritis Father        RA  . Hyperlipidemia Father   . Cancer Maternal Aunt        ovarian and uterine   . Arthritis Maternal Grandmother   . Heart attack Maternal Grandfather   . Stroke Paternal Grandmother   . Arthritis Paternal Grandmother   . Breast cancer Paternal Grandmother   . Stroke Paternal Uncle     Social History Social History   Tobacco Use  . Smoking status: Never Smoker  . Smokeless tobacco: Never Used  Substance Use Topics  . Alcohol use: Not Currently    Alcohol/week: 0.0 oz    Frequency: Never  . Drug use: No    Review of Systems Constitutional: Negative for fever. Eyes: Negative for visual complaints ENT: Positive for tongue lip and  cheek swelling Cardiovascular: Negative for chest pain. Respiratory: Negative for shortness of breath. Gastrointestinal: Negative for abdominal pain, vomiting  Genitourinary: Negative for urinary compaints Musculoskeletal: Negative for musculoskeletal complaints Skin: Negative for rash or hives. Neurological: Negative for headache All other ROS negative  ____________________________________________   PHYSICAL EXAM:  VITAL SIGNS: ED Triage Vitals  Enc Vitals Group     BP --      Pulse Rate 03/13/18 2118 (!) 119     Resp 03/13/18 2118 16     Temp 03/13/18 2118 98.5  F (36.9 C)     Temp Source 03/13/18 2118 Oral     SpO2 03/13/18 2118 95 %     Weight 03/13/18 2120 292 lb (132.5 kg)     Height 03/13/18 2120 5' 9"  (1.753 m)     Head Circumference --      Peak Flow --      Pain Score 03/13/18 2120 0     Pain Loc --      Pain Edu? --      Excl. in [place]? --    Constitutional: Alert and oriented. Well appearing and in no distress. Eyes: Normal exam ENT   Head: Normocephalic.  Moderate edema bilateral cheeks, mild edema to the lips, mild edema to left side of the tongue, largely patent oropharynx.   Mouth/Throat: Mucous membranes are moist. Cardiovascular: Normal rate, regular rhythm around 100 bpm.  No murmur. Respiratory: Normal respiratory effort without tachypnea nor retractions. Breath sounds are clear no wheeze rales or rhonchi. Gastrointestinal: Soft and nontender. No distention.   Musculoskeletal: Nontender with normal range of motion in all extremities. Neurologic:  Normal speech and language. No gross focal neurologic deficits Skin:  Skin is warm, dry and intact.  No hives. Psychiatric: Mood and affect are normal.   ____________________________________________   INITIAL IMPRESSION / ASSESSMENT AND PLAN / ED COURSE  Pertinent labs & imaging results that were available during my care of the patient were reviewed by me and considered in my medical decision making (see chart for details).  Presents to the emergency department with swelling of her face and left side of her tongue.  Differential would include angioedema, allergic reaction.  Patient denies any new foods.  Started taking lisinopril 3 days ago highly suspect ACE inhibitor induced angioedema.  We will place an IV, dose Solu-Medrol and Pepcid as well as IV fluids.  Patient states allergy to Benadryl.  Overall patient appears well at this time she does have facial edema, but no trouble breathing or swallowing at this time largely patent oropharynx we will continue to very closely  monitor in the emergency department.  ----------------------------------------- 11:13 PM on 03/13/2018 -----------------------------------------  Lower lip and tongue swelling is nearly completely resolved.  Patient continues to have moderate upper lip and bilateral cheek swelling.  Highly suspect angioedema due to ACE inhibitor.  Discussed again with the patient never to use an ACE inhibitor again and to immediately discontinue use and discussed this with her doctor on Monday.  Patient agreeable to this plan.  Patient care signed out to oncoming physician, will continue to monitor for improvement prior to discharge home.  We will discharge with 5 additional days of prednisone.  I discussed this with the patient who is agreeable.  ____________________________________________   FINAL CLINICAL IMPRESSION(S) / ED DIAGNOSES  ACE inhibitor induced angioedema    Harvest Dark, MD 03/13/18 2314

## 2018-03-13 NOTE — ED Triage Notes (Signed)
Patient ambulatory to stat desk. Patient states that she started noticing swelling in her lower lip about 12:30 today. Patient states that she was seen at an urgent care and given Decadron and prescribed prednisone. Patient states that the swelling had started improving. Patient states that since leaving urgent care the swelling has come back. Patient with swelling to face and tongue. Patient states that she is starting to feel short of breath.

## 2018-03-14 ENCOUNTER — Encounter: Payer: Self-pay | Admitting: Medical Oncology

## 2018-03-14 ENCOUNTER — Observation Stay
Admission: EM | Admit: 2018-03-14 | Discharge: 2018-03-15 | Disposition: A | Discharge status: Home / Self Care | Payer: 59 | Attending: Specialist | Admitting: Specialist

## 2018-03-14 DIAGNOSIS — E559 Vitamin D deficiency, unspecified: Secondary | ICD-10-CM | POA: Insufficient documentation

## 2018-03-14 DIAGNOSIS — T783XXA Angioneurotic edema, initial encounter: Principal | ICD-10-CM | POA: Insufficient documentation

## 2018-03-14 DIAGNOSIS — F329 Major depressive disorder, single episode, unspecified: Secondary | ICD-10-CM | POA: Insufficient documentation

## 2018-03-14 DIAGNOSIS — N951 Menopausal and female climacteric states: Secondary | ICD-10-CM | POA: Insufficient documentation

## 2018-03-14 DIAGNOSIS — Z880 Allergy status to penicillin: Secondary | ICD-10-CM | POA: Insufficient documentation

## 2018-03-14 DIAGNOSIS — Z888 Allergy status to other drugs, medicaments and biological substances status: Secondary | ICD-10-CM | POA: Insufficient documentation

## 2018-03-14 DIAGNOSIS — T464X5A Adverse effect of angiotensin-converting-enzyme inhibitors, initial encounter: Secondary | ICD-10-CM | POA: Diagnosis present

## 2018-03-14 DIAGNOSIS — Z79890 Hormone replacement therapy (postmenopausal): Secondary | ICD-10-CM | POA: Insufficient documentation

## 2018-03-14 DIAGNOSIS — Z79899 Other long term (current) drug therapy: Secondary | ICD-10-CM | POA: Insufficient documentation

## 2018-03-14 DIAGNOSIS — I10 Essential (primary) hypertension: Secondary | ICD-10-CM | POA: Insufficient documentation

## 2018-03-14 LAB — CBC WITH DIFFERENTIAL/PLATELET
Basophils Absolute: 0.1 10*3/uL (ref 0–0.1)
Basophils Relative: 0 %
Eosinophils Absolute: 0 10*3/uL (ref 0–0.7)
Eosinophils Relative: 0 %
HEMATOCRIT: 40.6 % (ref 35.0–47.0)
Hemoglobin: 13.4 g/dL (ref 12.0–16.0)
LYMPHS PCT: 7 %
Lymphs Abs: 1 10*3/uL (ref 1.0–3.6)
MCH: 27.7 pg (ref 26.0–34.0)
MCHC: 33.1 g/dL (ref 32.0–36.0)
MCV: 83.8 fL (ref 80.0–100.0)
MONO ABS: 0.3 10*3/uL (ref 0.2–0.9)
MONOS PCT: 2 %
NEUTROS ABS: 13.9 10*3/uL — AB (ref 1.4–6.5)
Neutrophils Relative %: 91 %
Platelets: 394 10*3/uL (ref 150–440)
RBC: 4.85 MIL/uL (ref 3.80–5.20)
RDW: 14.2 % (ref 11.5–14.5)
WBC: 15.3 10*3/uL — ABNORMAL HIGH (ref 3.6–11.0)

## 2018-03-14 LAB — BASIC METABOLIC PANEL
ANION GAP: 11 (ref 5–15)
BUN: 10 mg/dL (ref 6–20)
CO2: 21 mmol/L — AB (ref 22–32)
Calcium: 8.7 mg/dL — ABNORMAL LOW (ref 8.9–10.3)
Chloride: 103 mmol/L (ref 101–111)
Creatinine, Ser: 0.75 mg/dL (ref 0.44–1.00)
GFR calc Af Amer: >60 mL/min (ref >60)
GFR calc non Af Amer: >60 mL/min (ref >60)
GLUCOSE: 170 mg/dL — AB (ref 65–99)
Potassium: 3.8 mmol/L (ref 3.5–5.1)
Sodium: 135 mmol/L (ref 135–145)

## 2018-03-14 MED ORDER — DOCUSATE SODIUM 100 MG PO CAPS
100.0000 mg | ORAL_CAPSULE | Freq: Two times a day (BID) | ORAL | Status: DC
  Filled 2018-03-14 (×2): qty 1

## 2018-03-14 MED ORDER — FAMOTIDINE IN NACL 20-0.9 MG/50ML-% IV SOLN
20.0000 mg | INTRAVENOUS | Status: DC
  Administered 2018-03-14: 20 mg via INTRAVENOUS
  Filled 2018-03-14: qty 50

## 2018-03-14 MED ORDER — METHYLPREDNISOLONE SODIUM SUCC 125 MG IJ SOLR
60.0000 mg | INTRAMUSCULAR | Status: DC
  Administered 2018-03-14 – 2018-03-15 (×2): 60 mg via INTRAVENOUS
  Filled 2018-03-14 (×2): qty 2

## 2018-03-14 MED ORDER — NORETHINDRONE-ETH ESTRADIOL 1-35 MG-MCG PO TABS: 1.0000 | ORAL_TABLET | Freq: Every day | ORAL | Status: DC

## 2018-03-14 MED ORDER — FAMOTIDINE IN NACL 20-0.9 MG/50ML-% IV SOLN
INTRAVENOUS | Status: AC
  Administered 2018-03-14: 09:00:00
  Filled 2018-03-14: qty 50

## 2018-03-14 MED ORDER — ACETAMINOPHEN 325 MG PO TABS: 650.0000 mg | ORAL_TABLET | Freq: Four times a day (QID) | ORAL | Status: DC | PRN

## 2018-03-14 MED ORDER — ONDANSETRON HCL 4 MG PO TABS: 4.0000 mg | ORAL_TABLET | Freq: Four times a day (QID) | ORAL | Status: DC | PRN

## 2018-03-14 MED ORDER — BUPROPION HCL ER (XL) 150 MG PO TB24
300.0000 mg | ORAL_TABLET | Freq: Every day | ORAL | Status: DC
  Filled 2018-03-14: qty 2

## 2018-03-14 MED ORDER — VITAMIN D (ERGOCALCIFEROL) 1.25 MG (50000 UNIT) PO CAPS: 50000.0000 [IU] | ORAL_CAPSULE | ORAL | Status: DC

## 2018-03-14 MED ORDER — ACETAMINOPHEN 650 MG RE SUPP
650.0000 mg | Freq: Four times a day (QID) | RECTAL | Status: DC | PRN
Start: 2018-03-14 — End: 2018-03-15

## 2018-03-14 MED ORDER — ENOXAPARIN SODIUM 40 MG/0.4ML ~~LOC~~ SOLN: 40.0000 mg | SUBCUTANEOUS | Status: DC

## 2018-03-14 MED ORDER — METHYLPREDNISOLONE SODIUM SUCC 125 MG IJ SOLR
INTRAMUSCULAR | Status: AC
  Administered 2018-03-14: 09:00:00
  Filled 2018-03-14: qty 2

## 2018-03-14 MED ORDER — ENOXAPARIN SODIUM 40 MG/0.4ML ~~LOC~~ SOLN
40.0000 mg | Freq: Two times a day (BID) | SUBCUTANEOUS | Status: DC
  Filled 2018-03-14: qty 0.4

## 2018-03-14 MED ORDER — PREDNISONE 20 MG PO TABS: 40.0000 mg | ORAL_TABLET | Freq: Every day | ORAL | 0 refills | Status: DC

## 2018-03-14 MED ORDER — BISACODYL 5 MG PO TBEC: 5.0000 mg | DELAYED_RELEASE_TABLET | Freq: Every day | ORAL | Status: DC | PRN

## 2018-03-14 MED ORDER — SERTRALINE HCL 50 MG PO TABS
25.0000 mg | ORAL_TABLET | Freq: Every day | ORAL | Status: DC
  Filled 2018-03-14: qty 1

## 2018-03-14 MED ORDER — EPINEPHRINE 0.3 MG/0.3ML IJ SOAJ
INTRAMUSCULAR | Status: AC
  Administered 2018-03-14: 09:00:00
  Filled 2018-03-14: qty 0.3

## 2018-03-14 MED ORDER — ONDANSETRON HCL 4 MG/2ML IJ SOLN: 4.0000 mg | Freq: Four times a day (QID) | INTRAMUSCULAR | Status: DC | PRN

## 2018-03-14 MED ORDER — FAMOTIDINE 40 MG PO TABS: 40.0000 mg | ORAL_TABLET | Freq: Every evening | ORAL | 0 refills | Status: DC

## 2018-03-14 NOTE — H&P (Signed)
[place] [facility] [place] NAME: Natalie Petersen    MR#:  [id number]  DATE OF BIRTH:  January 19, 1976  DATE OF ADMISSION:  03/14/2018  PRIMARY CARE PHYSICIAN: Arnetha Courser, MD   REQUESTING/REFERRING PHYSICIAN: Lisa Roca  CHIEF COMPLAINT: Facial swelling   Chief Complaint  Patient presents with  . Angioedema    HISTORY OF PRESENT ILLNESS:  [name]  is a 42 y.o. female with a known history of essential hypertension recently started on ACE inhibitor 3 days ago comes in with lip swelling, tongue swelling and seen in urgent care yesterday afternoon and also in the emergency room last night.  IV steroids, IV Pepcid and discharged home with prednisone.  Patient went home around 2 AM last night and comes back today morning around 8.30 because of worsening facial swelling.  Swelling was better when she left last night but came back because noticed worsening facial swelling.  Patient also had swelling of upper upper lip today morning but it is gone at this time.  Patient main complaint is facial swelling.  And she wants to be observed overnight.  Started on lisinopril 3 days ago by PCP for hypertension.  Ends she started to have symptoms yesterday afternoon with lip swelling, care yesterday afternoon they give Decadron, prescribed prednisone.  After leaving urgent care patient started to get worsening symptoms so she came to the emergency room because she noted swelling of the face, tongue and also shortness of breath.  Today patient denies any shortness of breath.  No tongue swelling also.  PAST MEDICAL HISTORY:   Past Medical History:  Diagnosis Date  . Allergy    seasonal  . Depression   . Menopausal symptoms   . Metrorrhagia   . Vitamin D deficiency     PAST SURGICAL HISTOIRY:   Past Surgical History:  Procedure Laterality Date  . BRAIN SURGERY      SOCIAL HISTORY:   Social History   Tobacco Use  . Smoking status: Never Smoker  .  Smokeless tobacco: Never Used  Substance Use Topics  . Alcohol use: Not Currently    Alcohol/week: 0.0 oz    Frequency: Never    FAMILY HISTORY:   Family History  Problem Relation Age of Onset  . Heart murmur Mother   . Atrial fibrillation Mother   . Arthritis Father        RA  . Hyperlipidemia Father   . Cancer Maternal Aunt        ovarian and uterine   . Arthritis Maternal Grandmother   . Heart attack Maternal Grandfather   . Stroke Paternal Grandmother   . Arthritis Paternal Grandmother   . Breast cancer Paternal Grandmother   . Stroke Paternal Uncle     DRUG ALLERGIES:   Allergies  Allergen Reactions  . Amoxicillin Hives and Swelling  . Benadryl [Diphenhydramine Hcl (Sleep)] Hives and Swelling  . Lisinopril     REVIEW OF SYSTEMS:  CONSTITUTIONAL: No fever, fatigue or weakness.  Noted to have facial  swelling EYES: No blurred or double vision.  EARS, NOSE, AND THROAT: No tinnitus or ear pain.  RESPIRATORY: No cough, shortness of breath, wheezing or hemoptysis.  CARDIOVASCULAR: No chest pain, orthopnea, edema.  GASTROINTESTINAL: No nausea, vomiting, diarrhea or abdominal pain.  GENITOURINARY: No dysuria, hematuria.  ENDOCRINE: No polyuria, nocturia,  HEMATOLOGY: No anemia, easy bruising or bleeding SKIN: No rash or lesion. MUSCULOSKELETAL: No joint pain or arthritis.   NEUROLOGIC:  No tingling, numbness, weakness.  PSYCHIATRY: No anxiety or depression.   MEDICATIONS AT HOME:   Prior to Admission medications   Medication Sig Start Date End Date Taking? Authorizing Provider  buPROPion (WELLBUTRIN XL) 300 MG 24 hr tablet Take 1 tablet (300 mg total) by mouth daily. 01/21/18  Yes Arnetha Courser, MD  norethindrone-ethinyl estradiol 1/35 ([place] 1/35) tablet Take 1 tablet by mouth daily. 01/21/18 03/14/18 Yes Lada, Satira Anis, MD  predniSONE (DELTASONE) 20 MG tablet Take 2 tablets (40 mg total) by mouth daily. 03/14/18  Yes Harvest Dark, MD  sertraline (ZOLOFT)  25 MG tablet Take 1 tablet (25 mg total) by mouth daily. 01/21/18  Yes Lada, Satira Anis, MD  Vitamin D, Ergocalciferol, (DRISDOL) 50000 units CAPS capsule Take 1 capsule (50,000 Units total) by mouth every 7 (seven) days. Patient taking differently: Take 50,000 Units by mouth every Thursday.  02/24/18  Yes Eber Jones, MD  famotidine (PEPCID) 40 MG tablet Take 1 tablet (40 mg total) by mouth every evening. Patient not taking: Reported on 03/14/2018 03/14/18 03/14/19  Loney Hering, MD  lisinopril (PRINIVIL,ZESTRIL) 10 MG tablet Take 1 tablet (10 mg total) by mouth daily. Patient not taking: Reported on 03/14/2018 02/24/18   Eber Jones, MD      VITAL SIGNS:  Blood pressure (!) 159/86, pulse (!) 109, temperature 99.2 F (37.3 C), temperature source Oral, resp. rate 18, height 5' 9"  (1.753 m), weight 132.5 kg (292 lb), last menstrual period 02/20/2018, SpO2 96 %.  PHYSICAL EXAMINATION:  GENERAL:  42 y.o.-year-old patient lying in the bed with no acute distress.  EYES: Pupils equal, round, reactive to light . No scleral icterus. Extraocular muscles intact.  HEENT: Head atraumatic, normocephalic. Oropharynx and nasopharynx clear.  Noted to have swelling of both cheeks. NECK:  Supple, no jugular venous distention. No thyroid enlargement, no tenderness.  LUNGS: Normal breath sounds bilaterally, no wheezing, rales,rhonchi or crepitation. No use of accessory muscles of respiration.  CARDIOVASCULAR: S1, S2 normal. No murmurs, rubs, or gallops.  ABDOMEN: Soft, nontender, nondistended. Bowel sounds present. No organomegaly or mass.  EXTREMITIES: No pedal edema, cyanosis, or clubbing.  NEUROLOGIC: Cranial nerves II through XII are intact. Muscle strength 5/5 in all extremities. Sensation intact. Gait not checked.  PSYCHIATRIC: The patient is alert and oriented x 3.  SKIN: No obvious rash, lesion, or ulcer.   LABORATORY PANEL:   CBC Recent Labs  Lab 03/14/18 0857  WBC 15.3*  HGB  13.4  HCT 40.6  PLT 394   ------------------------------------------------------------------------------------------------------------------  Chemistries  Recent Labs  Lab 03/14/18 0857  NA 135  K 3.8  CL 103  CO2 21*  GLUCOSE 170*  BUN 10  CREATININE 0.75  CALCIUM 8.7*   ------------------------------------------------------------------------------------------------------------------  Cardiac Enzymes No results for input(s): TROPONINI in the last 168 hours. ------------------------------------------------------------------------------------------------------------------  RADIOLOGY:  No results found.  EKG:   Orders placed or performed in visit on 02/11/18  . EKG 12-Lead    IMPRESSION AND PLAN:   42 year old female patient who started on lisinopril 3 days ago comes in with angioedema secondary to ACE inhibitors'' because of recurrent ER visits patient will be monitored overnight ; continue IV steroids, IV Pepcid, patient allergic to Benadryl so she cannot get Benadryl patient received EpiPen in the emergency room.  At this time there is no airway compromise.  She will continue to watch closely, continue steroids.  Symptoms should resolve within 12-72 hours.  If not up to date recommends giving FFP  2 units. 2.  Depression: Continue antidepressants. 3.  Essential hypertension: Patient can talk to PCP regarding using different BP medicine after discharge  All the records are reviewed and case discussed with ED provider. Management plans discussed with the patient, family and they are in agreement.  CODE STATUS: full  TOTAL TIME TAKING CARE OF THIS PATIENT: 33mnutes.    SEpifanio LeschesM.D on 03/14/2018 at 12:14 PM  Between 7am to 6pm - Pager - [phone]  After 6pm go to [email or web address] - password [email or web address]  Office  [phone] CC: Primary care physician; LArnetha Courser MD  Note: This dictation was prepared with Dragon  dictation along with smaller phrase technology. Any transcriptional errors that result from this process are unintentional.

## 2018-03-14 NOTE — Discharge Instructions (Signed)
We discussed please do not take lisinopril any longer.  Please call your doctor to inform them of today's ER visit.  Please take your steroids as prescribed for the next 5 days.  Return to the emergency department immediately for any increase in swelling around your mouth face or tongue, any difficulty breathing, or any other symptoms personally concerning to herself.

## 2018-03-14 NOTE — ED Notes (Signed)
ED Provider at bedside. 

## 2018-03-14 NOTE — ED Provider Notes (Signed)
-----------------------------------------   1:32 AM on 03/14/2018 -----------------------------------------   Blood pressure 132/76, pulse 97, temperature 98.5 F (36.9 C), temperature source Oral, resp. rate 17, height 5' 9"  (1.753 m), weight 132.5 kg (292 lb), last menstrual period 02/20/2018, SpO2 98 %.  Assuming care from Dr. Kerman Passey.  In short, Natalie Petersen is a 42 y.o. female with a chief complaint of Allergic Reaction .  Refer to the original H&P for additional details.  The current plan of care is to reassess the patient after multiple hours of observation..     I did evaluate the patient and she reports that her tongue swelling has improved significantly and her lower jaw swelling is improved.  The patient still has some swelling to her upper lip but the remaining areas are improving.  She feels comfortable being discharged home.  Dr. Kerman Passey did write a prescription for prednisone for the patient but the patient states that she did receive a prescription when she was at urgent care earlier.  I will also add Pepcid to the patient's discharge medication and she will be discharged home.   Loney Hering, MD 03/14/18 [phone]

## 2018-03-14 NOTE — ED Provider Notes (Signed)
[facility] Emergency Department Provider Note ____________________________________________   I have reviewed the triage vital signs and the triage nursing note.  HISTORY  Chief Complaint Angioedema   Historian Patient  HPI Natalie Petersen is a 42 y.o. female presents for facial swelling - was seen last night after onset yesterday prior to visit of facial and lip/tongue swelling felt at that time to be due to new medication Ace In hibitor lisinopril from several days earlier.  Seen in ED and given Solu-Medrol and Pepcid IV, after appears observation was significantly improving and was discharged home suspicious of ACE inhibitor angioedema rather than anaphylactic reaction.  Patient reports Benadryl allergy which causes hives and swelling.  Since patient's been home and stated that she woke up and now the facial swelling is much worse than it was when she left even worse than it was when she had initially presented.  However she is not having any tongue swelling or throat swelling or trouble breathing or wheezing.  Symptoms are moderate to severe.  Nothing seems to make it worse or better.  She is using an ice pack for comfort.  No prior allergies or anaphylaxis history.    Past Medical History:  Diagnosis Date  . Allergy    seasonal  . Depression   . Menopausal symptoms   . Metrorrhagia   . Vitamin D deficiency     Patient Active Problem List   Diagnosis Date Noted  . Family history of ovarian cancer 10/26/2017  . Cervical cancer screening 09/09/2016  . Abnormal mammogram of left breast 09/04/2015  . Obesity, Class III, BMI 40-49.9 (morbid obesity) ([place]) 07/16/2015  . Depression, major, recurrent, in remission ([place]) 07/16/2015  . Vitamin D deficiency 07/16/2015  . Oral contraceptive pill surveillance 07/16/2015  . Annual physical exam 07/16/2015  . Breast cancer screening 07/16/2015    Past Surgical History:  Procedure Laterality Date  . BRAIN  SURGERY      Prior to Admission medications   Medication Sig Start Date End Date Taking? Authorizing Provider  buPROPion (WELLBUTRIN XL) 300 MG 24 hr tablet Take 1 tablet (300 mg total) by mouth daily. 01/21/18  Yes Arnetha Courser, MD  norethindrone-ethinyl estradiol 1/35 ([place] 1/35) tablet Take 1 tablet by mouth daily. 01/21/18 03/14/18 Yes Lada, Satira Anis, MD  predniSONE (DELTASONE) 20 MG tablet Take 2 tablets (40 mg total) by mouth daily. 03/14/18  Yes Harvest Dark, MD  sertraline (ZOLOFT) 25 MG tablet Take 1 tablet (25 mg total) by mouth daily. 01/21/18  Yes Lada, Satira Anis, MD  Vitamin D, Ergocalciferol, (DRISDOL) 50000 units CAPS capsule Take 1 capsule (50,000 Units total) by mouth every 7 (seven) days. Patient taking differently: Take 50,000 Units by mouth every Thursday.  02/24/18  Yes Eber Jones, MD  famotidine (PEPCID) 40 MG tablet Take 1 tablet (40 mg total) by mouth every evening. Patient not taking: Reported on 03/14/2018 03/14/18 03/14/19  Loney Hering, MD  lisinopril (PRINIVIL,ZESTRIL) 10 MG tablet Take 1 tablet (10 mg total) by mouth daily. Patient not taking: Reported on 03/14/2018 02/24/18   Eber Jones, MD    Allergies  Allergen Reactions  . Amoxicillin Hives and Swelling  . Benadryl [Diphenhydramine Hcl (Sleep)] Hives and Swelling  . Lisinopril     Family History  Problem Relation Age of Onset  . Heart murmur Mother   . Atrial fibrillation Mother   . Arthritis Father        RA  . Hyperlipidemia  Father   . Cancer Maternal Aunt        ovarian and uterine   . Arthritis Maternal Grandmother   . Heart attack Maternal Grandfather   . Stroke Paternal Grandmother   . Arthritis Paternal Grandmother   . Breast cancer Paternal Grandmother   . Stroke Paternal Uncle     Social History Social History   Tobacco Use  . Smoking status: Never Smoker  . Smokeless tobacco: Never Used  Substance Use Topics  . Alcohol use: Not Currently     Alcohol/week: 0.0 oz    Frequency: Never  . Drug use: No    Review of Systems  Constitutional: Negative for fever. Eyes: Negative for visual changes. ENT: Negative for sore throat. Cardiovascular: Negative for chest pain. Respiratory: Negative for shortness of breath. Gastrointestinal: Negative for abdominal pain, vomiting and diarrhea. Genitourinary: Negative for dysuria. Musculoskeletal: Negative for back pain. Skin: Negative for rash/hives.  She does have facial swelling. Neurological: Negative for headache.  ____________________________________________   PHYSICAL EXAM:  VITAL SIGNS: ED Triage Vitals  Enc Vitals Group     BP 03/14/18 0836 (!) 159/86     Pulse Rate 03/14/18 0836 (!) 109     Resp 03/14/18 0836 18     Temp 03/14/18 0836 99.2 F (37.3 C)     Temp Source 03/14/18 0836 Oral     SpO2 03/14/18 0836 96 %     Weight 03/14/18 0837 292 lb (132.5 kg)     Height 03/14/18 0837 5' 9"  (1.753 m)     Head Circumference --      Peak Flow --      Pain Score 03/14/18 0837 0     Pain Loc --      Pain Edu? --      Excl. in [place]? --      Constitutional: Alert and oriented.  No acute distress. HEENT   Head: Normocephalic.  Severe facial swelling including lips, cheeks, skin around the nose up around the periorbital area without redness, consistent with angioedema.      Eyes: Conjunctivae are normal. Pupils equal and round.       Ears:         Nose: No congestion/rhinnorhea.   Mouth/Throat: Mucous membranes are moist.  Tongue looks normal without swelling.   Neck: No stridor. Cardiovascular/Chest: Normal rate, regular rhythm.  No murmurs, rubs, or gallops. Respiratory: Normal respiratory effort without tachypnea nor retractions. Breath sounds are clear and equal bilaterally. No wheezes/rales/rhonchi. Gastrointestinal: Soft. No distention, no guarding, no rebound. Nontender.  Obese Genitourinary/rectal:Deferred Musculoskeletal: Nontender with normal range of  motion in all extremities. No joint effusions.  No lower extremity tenderness.  No edema. Neurologic:  Normal speech and language. No gross or focal neurologic deficits are appreciated. Skin:  Skin is warm, dry and intact. No rash noted. Psychiatric: Mood and affect are normal. Speech and behavior are normal. Patient exhibits appropriate insight and judgment.   ____________________________________________  LABS (pertinent positives/negatives) I, Lisa Roca, MD the attending physician have reviewed the labs noted below.  Labs Reviewed  BASIC METABOLIC PANEL - Abnormal; Notable for the following components:      Result Value   CO2 21 (*)    Glucose, Bld 170 (*)    Calcium 8.7 (*)    All other components within normal limits  CBC WITH DIFFERENTIAL/PLATELET - Abnormal; Notable for the following components:   WBC 15.3 (*)    Neutro Abs 13.9 (*)    All other  components within normal limits    __________________________________________  PROCEDURES  Procedure(s) performed: None  Procedures  Critical Care performed: None   ____________________________________________  ED COURSE / ASSESSMENT AND PLAN  Pertinent labs & imaging results that were available during my care of the patient were reviewed by me and considered in my medical decision making (see chart for details).   Patient back with worsening swelling to the face, still feel like angioedema due to ACE inhibitor may be the most likely cause here, although unfortunate that it came back in some ways worse than it was previous, although no airway concern or compromise at this moment in time.  Given the concern about the possibility for this being anaphylactic to some other sort of allergen, we discussed doing a dose of epi 0.3 IM in addition to IV adjuncts of Solu-Medrol and Pepcid.  Avoiding Benadryl based on her history of severe reaction to Benadryl.  I discussed with her likely 24-hour observation given significant  facial swelling and worsening despite outpatient management.  This still does not look like a cellulitis.  Reevaluation after about 3 hours, patient has some improvement in the facial edema, but still very significant facial edema.  I think with the recurrent severe angioedema to the face, she warrants 24-hour observation.  Discussed with Dr. Vianne Bulls.  CONSULTATIONS:   Hospitalist for admission.   Patient / Family / Caregiver informed of clinical course, medical decision-making process, and agree with plan.    ___________________________________________   FINAL CLINICAL IMPRESSION(S) / ED DIAGNOSES   Final diagnoses:  Angioedema, initial encounter      ___________________________________________        Note: This dictation was prepared with Dragon dictation. Any transcriptional errors that result from this process are unintentional    Lisa Roca, MD 03/14/18 1152

## 2018-03-14 NOTE — ED Notes (Signed)
This RN called lab to confirm possession of blood tubes. RN will continue to monitor.

## 2018-03-14 NOTE — Progress Notes (Signed)
Anticoagulation monitoring(Lovenox):  42 yo  female ordered Lovenox 40 mg Q24h  Filed Weights   03/14/18 0837  Weight: 292 lb (132.5 kg)   Body mass index is 43.12 kg/m.    Lab Results  Component Value Date   CREATININE 0.75 03/14/2018   CREATININE 0.77 02/11/2018   CREATININE 0.83 10/13/2017   Estimated Creatinine Clearance: 132.7 mL/min (by C-G formula based on SCr of 0.75 mg/dL). Hemoglobin & Hematocrit     Component Value Date/Time   HGB 13.4 03/14/2018 0857   HGB 13.7 02/11/2018 1148   HCT 40.6 03/14/2018 0857   HCT 40.2 02/11/2018 1148     Per Protocol for Patient with estCrcl > 30 ml/min and BMI > 40, will transition to Lovenox 40 mg Q12h.

## 2018-03-14 NOTE — Progress Notes (Signed)
Pt admitted to room 135 from ED. Pt alert and oriented X4. Pts skin intact, pt has facial swelling around mouth and eyes. Ice packs applied, Family at bedside. Pt states she is not having any trouble breathing.

## 2018-03-14 NOTE — ED Notes (Signed)
Verbal order was received to administer epi pen, 125 mg solu med, and 5m pepcid. EDP at bedside.

## 2018-03-14 NOTE — ED Triage Notes (Signed)
Pt reports she was here last night with angioedema, swelling was better when she was discharged and now has began to worsen again. Visible swelling to lips.

## 2018-03-15 ENCOUNTER — Other Ambulatory Visit: Payer: Self-pay

## 2018-03-15 DIAGNOSIS — I10 Essential (primary) hypertension: Secondary | ICD-10-CM | POA: Diagnosis not present

## 2018-03-15 DIAGNOSIS — T783XXA Angioneurotic edema, initial encounter: Secondary | ICD-10-CM | POA: Diagnosis not present

## 2018-03-15 LAB — CBC
HEMATOCRIT: 39.6 % (ref 35.0–47.0)
HEMOGLOBIN: 12.8 g/dL (ref 12.0–16.0)
MCH: 27.2 pg (ref 26.0–34.0)
MCHC: 32.4 g/dL (ref 32.0–36.0)
MCV: 84 fL (ref 80.0–100.0)
PLATELETS: 354 10*3/uL (ref 150–440)
RBC: 4.72 MIL/uL (ref 3.80–5.20)
RDW: 14.5 % (ref 11.5–14.5)
WBC: 17.4 10*3/uL — AB (ref 3.6–11.0)

## 2018-03-15 LAB — BASIC METABOLIC PANEL
Anion gap: 8 (ref 5–15)
BUN: 11 mg/dL (ref 6–20)
CHLORIDE: 106 mmol/L (ref 101–111)
CO2: 25 mmol/L (ref 22–32)
CREATININE: 0.68 mg/dL (ref 0.44–1.00)
Calcium: 8.8 mg/dL — ABNORMAL LOW (ref 8.9–10.3)
GFR calc non Af Amer: >60 mL/min (ref >60)
Glucose, Bld: 152 mg/dL — ABNORMAL HIGH (ref 65–99)
POTASSIUM: 3.8 mmol/L (ref 3.5–5.1)
SODIUM: 139 mmol/L (ref 135–145)

## 2018-03-15 LAB — GLUCOSE, CAPILLARY: Glucose-Capillary: 93 mg/dL (ref 65–99)

## 2018-03-15 MED ORDER — EPINEPHRINE 0.3 MG/0.3ML IJ SOAJ: 0.3000 mg | Freq: Once | INTRAMUSCULAR | 0 refills | Status: AC

## 2018-03-15 MED ORDER — HYDROCHLOROTHIAZIDE 25 MG PO TABS
25.0000 mg | ORAL_TABLET | Freq: Every day | ORAL | Status: DC
  Administered 2018-03-15: 25 mg via ORAL
  Filled 2018-03-15: qty 1

## 2018-03-15 MED ORDER — HYDROCHLOROTHIAZIDE 25 MG PO TABS: 25.0000 mg | ORAL_TABLET | Freq: Every day | ORAL | 1 refills | Status: DC

## 2018-03-15 MED ORDER — METOPROLOL TARTRATE 25 MG PO TABS
12.5000 mg | ORAL_TABLET | Freq: Two times a day (BID) | ORAL | Status: DC
  Administered 2018-03-15: 12.5 mg via ORAL
  Filled 2018-03-15: qty 1

## 2018-03-15 MED ORDER — PREDNISONE 10 MG PO TABS: ORAL_TABLET | ORAL | Status: DC

## 2018-03-15 MED ORDER — POLYVINYL ALCOHOL 1.4 % OP SOLN
1.0000 [drp] | OPHTHALMIC | Status: DC | PRN
  Administered 2018-03-15: 1 [drp] via OPHTHALMIC
  Filled 2018-03-15: qty 15

## 2018-03-15 MED ORDER — METOPROLOL TARTRATE 25 MG PO TABS: 12.5000 mg | ORAL_TABLET | Freq: Two times a day (BID) | ORAL | 1 refills | Status: DC

## 2018-03-15 MED ORDER — HYDRALAZINE HCL 20 MG/ML IJ SOLN: 10.0000 mg | Freq: Four times a day (QID) | INTRAMUSCULAR | Status: DC | PRN

## 2018-03-15 NOTE — Progress Notes (Signed)
Pt has tolerated metoprolol and hctz given this morning with no further swelling. BP decreased to 147/84. Reviewed prescriptions and discharge instructions with pt and family, all questions answered. VSS, pt in NAD. PIV removed. Pt declined to be wheeled out in wheel chair and wishes to walk instead with family.   [place], Jerry Caras

## 2018-03-16 LAB — HIV ANTIBODY (ROUTINE TESTING W REFLEX): HIV Screen 4th Generation wRfx: NONREACTIVE

## 2018-03-17 ENCOUNTER — Telehealth: Payer: Self-pay

## 2018-03-17 NOTE — Telephone Encounter (Signed)
TOC #1. Called pt to f/u after d/c from [facility] on 03/15/18. Also wanted to confirm her hosp f/u appt w/ Suezanne Cheshire, FNP on 03/22/18 @ 10:20am. Discharge planning includes the following:  - Start HCTZ, Metoprolol and Prednisone taper - Stop Lisinopril and Pecid - Resume low sodium diet - f/u with Dr. Sanda Klein in 1 week As part of the [facility] f/u call, I am also wanting to discuss/review the above information with the pt to ensure all of the above has been taken care of. LVM requesting returned call.

## 2018-03-18 ENCOUNTER — Other Ambulatory Visit: Payer: Self-pay

## 2018-03-18 ENCOUNTER — Other Ambulatory Visit (INDEPENDENT_AMBULATORY_CARE_PROVIDER_SITE_OTHER): Payer: Self-pay | Admitting: Family Medicine

## 2018-03-18 ENCOUNTER — Telehealth: Payer: Self-pay

## 2018-03-18 DIAGNOSIS — Z596 Low income: Secondary | ICD-10-CM

## 2018-03-18 DIAGNOSIS — E559 Vitamin D deficiency, unspecified: Secondary | ICD-10-CM

## 2018-03-18 DIAGNOSIS — Z0000 Encounter for general adult medical examination without abnormal findings: Secondary | ICD-10-CM

## 2018-03-18 NOTE — Progress Notes (Addendum)
Spoke with pt during TOC call. Pt states she was not able to fill the Epi-Pen Rx due to cost. Referral sent to [facility] to assist with affording this medication.  ------------------------------------------------ Above note by Emelia Salisbury  Please suggest that the patient look into patient assistance or a savings card for this medicine We'll also encourage her to keep benadryl 25 mg on hand at all times and take two immediately at the onset of any lip swelling, and to immediate seek medical attention at ER or urgent care if her symptoms recur Thank you, Dr. Enid Derry -------------------------------------------------

## 2018-03-18 NOTE — Telephone Encounter (Signed)
Transition Care Management Follow-Up Telephone Call   Date discharged and where: [facility] on 03/15/18  How have you been since you were released from the hospital?  States swelling has subsided and denies having adverse reactions to her newly prescribed medications. Denies having any dyspnea.  Any patient concerns? Cost of epi-pen was $300.00 out of pocket. Ultimately did not fill this Rx. Referral has been sent to [facility] to determine if there is an available program to offset the cost of this medication.  Items Reviewed: Verified = V   Meds: V  Allergies: V  Dietary Orders: Low sodium diet. Verbalized acceptance and understanding about dietary limitations and restrictions.  Functional Questionnaire:  Independent = I Dependent = D  ADLs: I  Dressing- I   Eating- I  Maintaining continence- I  Transferring- I  Transportation- I  Meal Prep- I  Managing Meds- I  Additional Items Reviewed:  Hospital f/u appt confirmed? Yes. Scheduled to see Suezanne Cheshire, FNP on 03/22/18 @ 10:20am. Denies need for transportation arrangements.  If their condition worsens, is the pt aware to call PCP or go to the Emergency Dept.? Yes. Verbalized acceptance and understanding  Was the patient provided with contact information for the PCP's office or ED? Yes  Was to pt encouraged to call back with questions or concerns? Yes

## 2018-03-22 ENCOUNTER — Encounter: Payer: Self-pay | Admitting: Nurse Practitioner

## 2018-03-22 ENCOUNTER — Inpatient Hospital Stay: Payer: 59 | Admitting: Family Medicine

## 2018-03-22 ENCOUNTER — Ambulatory Visit (INDEPENDENT_AMBULATORY_CARE_PROVIDER_SITE_OTHER): Payer: 59 | Admitting: Nurse Practitioner

## 2018-03-22 VITALS — BP 120/68 | HR 83 | Temp 98.3°F | Resp 18 | Ht 69.0 in | Wt 286.3 lb

## 2018-03-22 DIAGNOSIS — T464X5D Adverse effect of angiotensin-converting-enzyme inhibitors, subsequent encounter: Secondary | ICD-10-CM | POA: Diagnosis not present

## 2018-03-22 DIAGNOSIS — Z09 Encounter for follow-up examination after completed treatment for conditions other than malignant neoplasm: Secondary | ICD-10-CM

## 2018-03-22 DIAGNOSIS — R454 Irritability and anger: Secondary | ICD-10-CM | POA: Diagnosis not present

## 2018-03-22 DIAGNOSIS — T783XXD Angioneurotic edema, subsequent encounter: Secondary | ICD-10-CM

## 2018-03-22 DIAGNOSIS — R002 Palpitations: Secondary | ICD-10-CM | POA: Diagnosis not present

## 2018-03-22 DIAGNOSIS — F3340 Major depressive disorder, recurrent, in remission, unspecified: Secondary | ICD-10-CM | POA: Diagnosis not present

## 2018-03-22 DIAGNOSIS — I10 Essential (primary) hypertension: Secondary | ICD-10-CM

## 2018-03-22 NOTE — Progress Notes (Addendum)
Name: Natalie Petersen   MRN: [id number]    DOB: [birth year]   Date:03/22/2018       Progress Note  Subjective  Chief Complaint  Chief Complaint  Patient presents with  . Hospitalization Follow-up    HPI Hospital Follow up  Pt presented to ED fon 3/23 and 3/24 for angioedema. On 3/24 she was given IV solumedrol, pepcid and IV fluids- swelling decreased, ACEi discontinued and discharged with PO prednisone and pepcid. Patient went home and returned the following day due to increase facial swelling- without throat or tongue swelling- gave IM epi and repeated solumederol and pepcid IV and admitted for 24 hour observation States swelling resolved Denies having any dyspnea. Does note fatigue on the new medication regimen. Takes Vitamin D weekly due to vitamin D deficiency. States requiring frequent naps. States epi-pen will be picked up today.    Hypertension  States Blood pressure has been bouncing around at home so 124-148 /67-92 in the past few days; Patient does have headaches- believes it is allergy related. Well-controlled today. She has been compliant with medications HCTZ 37m daily and Lopresssor 231mBID - endorses severe fatigue and increased stressors.   Palpitations followed up by Dr. KaAdair Patternsignificant ECHO reading and EKG findings. Palpitations happen daily, lasting about 30 minutes when it occurs, random time of day, sometimes when resting sometimes with activity. Denies associated lightheadedness and dizziness or chest pain. Patient has stopped taking caffeine and added sugar for the past two weeks. Lab Results  Component Value Date   TSH 3.640 02/11/2018   Lab Results  Component Value Date   WBC 17.4 (H) 03/15/2018   HGB 12.8 03/15/2018   HCT 39.6 03/15/2018   MCV 84.0 03/15/2018   PLT 354 03/15/2018     Irritability/ MDD Patient states has been feeling on edge for at least a month and feels like it was heightened with prednisone. Patient states has become more irritable  and stressed overall- about things she normally wouldn't be stressed about. Patient states zoloft and wellbutrin (have been on it for about 9 years) has well controlled these symptoms in the past. Patient states she feels she need a week off to re-focus and concentrate on her health and regroup. Concerned about changing medication regimen she has been on for years but also having difficulty managing emotions.    Medication reconciliation completed.   Patient Active Problem List   Diagnosis Date Noted  . ACE inhibitor-aggravated angioedema 03/14/2018  . Family history of ovarian cancer 10/26/2017  . Cervical cancer screening 09/09/2016  . Abnormal mammogram of left breast 09/04/2015  . Obesity, Class III, BMI 40-49.9 (morbid obesity) [place]  . Depression, major, recurrent, in remission [place]/25/2016  . Vitamin D deficiency 07/16/2015  . Oral contraceptive pill surveillance 07/16/2015  . Annual physical exam 07/16/2015  . Breast cancer screening 07/16/2015    Social History   Tobacco Use  . Smoking status: Never Smoker  . Smokeless tobacco: Never Used  Substance Use Topics  . Alcohol use: Not Currently    Alcohol/week: 0.0 oz    Frequency: Never     Current Outpatient Medications:  .  buPROPion (WELLBUTRIN XL) 300 MG 24 hr tablet, Take 1 tablet (300 mg total) by mouth daily., Disp: 90 tablet, Rfl: 3 .  EPINEPHrine 0.3 mg/0.3 mL IJ SOAJ injection, Inject 0.3 mg into the muscle once. , Disp: , Rfl:  .  hydrochlorothiazide (HYDRODIURIL) 25 MG tablet, Take 1 tablet (25 mg total) by  mouth daily., Disp: 30 tablet, Rfl: 1 .  metoprolol tartrate (LOPRESSOR) 25 MG tablet, Take 0.5 tablets (12.5 mg total) by mouth 2 (two) times daily., Disp: 30 tablet, Rfl: 1 .  sertraline (ZOLOFT) 25 MG tablet, Take 1 tablet (25 mg total) by mouth daily., Disp: 90 tablet, Rfl: 3 .  Vitamin D, Ergocalciferol, (DRISDOL) 50000 units CAPS capsule, Take 1 capsule (50,000 Units total) by mouth every  7 (seven) days. (Patient taking differently: Take 50,000 Units by mouth every Thursday. ), Disp: 4 capsule, Rfl: 0 .  norethindrone-ethinyl estradiol 1/35 ([place] 1/35) tablet, Take 1 tablet by mouth daily., Disp: 3 Package, Rfl: 3 .  predniSONE (DELTASONE) 10 MG tablet, Label  & dispense according to the schedule below. 5 Pills PO for 1 day then, 4 Pills PO for 1 day, 3 Pills PO for 1 day, 2 Pills PO for 1 day, 1 Pill PO for 1 days then STOP. (Patient not taking: Reported on 03/22/2018), Disp: , Rfl:   Allergies  Allergen Reactions  . Ace Inhibitors Swelling  . Lisinopril Swelling  . Amoxicillin Hives and Swelling  . Benadryl [Diphenhydramine Hcl (Sleep)] Hives and Swelling    ROS  Constitutional: Negative for fever or weight change.  Respiratory: Negative for cough and shortness of breath.   Cardiovascular: Negative for chest pain or Positive palpitations.  Musculoskeletal: Negative for gait problem or joint swelling.  Skin: Negative for rash.  Neurological: Negative for dizziness or Positive headache- thought it was due to allergies.  No other specific complaints in a complete review of systems (except as listed in HPI above).  Objective  Vitals:   03/22/18 1012  BP: 120/68  Pulse: 83  Resp: 18  Temp: 98.3 F (36.8 C)  TempSrc: Oral  SpO2: 95%  Weight: 286 lb 4.8 oz (129.9 kg)  Height: 5' 9"  (1.753 m)    Body mass index is 42.28 kg/m.  Nursing Note and Vital Signs reviewed.  Physical Exam  Constitutional: Patient appears well-developed and well-nourished. Obese No distress.  HEENT: head atraumatic, normocephalicneck supple without lymphadenopathy, no carotid bruits , oropharynx pink and moist without exudate or swelling.  Cardiovascular: Normal rate, regular rhythm, S1/S2 present.  No murmur or rub heard.  Pulmonary/Chest: Effort normal and breath sounds clear. No respiratory distress or retractions. Abdominal: Soft and non-tender, bowel sounds present Psychiatric:  Patient has a normal mood and affect. behavior is normal. Judgment and thought content normal.  No results found for this or any previous visit (from the past 72 hour(s)).  Assessment & Plan  1. Palpitation - Continue BB, monitor BPs and SE profile  - Ambulatory referral to Cardiology  2. Depression, major, recurrent, in remission ([place]) -Continue current medication regimen  - Ambulatory referral to Psychiatry  3. Irritability - completed course of steroids on Saturday; follow up with specialty for further managment  - Ambulatory referral to Psychiatry  4. Angiotensin converting enzyme inhibitor-aggravated angioedema, subsequent encounter - listed as allergy, angioedema resolved.   5. Essential hypertension Stable, continue BB and Diuretic- follow-up in one week to recheck due to higher readings at home and possible SE: fatigue   -Red flags and when to present for emergency care or RTC including fever >101.48F, chest pain, shortness of breath, new/worsening/un-resolving symptoms, slurred speech, unilateral weakness reviewed with patient at time of visit. Follow up and care instructions discussed and provided in AVS.  --------------------------- I have reviewed this encounter including the documentation in this note and/or discussed this patient with the provider,  Suezanne Cheshire DNP AGNP-C. I am certifying that I agree with the content of this note as supervising physician. Enid Derry, [facility] 03/22/2018, 12:19 PM

## 2018-03-22 NOTE — Patient Instructions (Signed)
DASH Eating Plan DASH stands for "Dietary Approaches to Stop Hypertension." The DASH eating plan is a healthy eating plan that has been shown to reduce high blood pressure (hypertension). It may also reduce your risk for type 2 diabetes, heart disease, and stroke. The DASH eating plan may also help with weight loss. What are tips for following this plan? General guidelines  Avoid eating more than 2,300 mg (milligrams) of salt (sodium) a day. If you have hypertension, you may need to reduce your sodium intake to 1,500 mg a day.  Limit alcohol intake to no more than 1 drink a day for nonpregnant women and 2 drinks a day for men. One drink equals 12 oz of beer, 5 oz of wine, or 1 oz of hard liquor.  Work with your health care provider to maintain a healthy body weight or to lose weight. Ask what an ideal weight is for you.  Get at least 30 minutes of exercise that causes your heart to beat faster (aerobic exercise) most days of the week. Activities may include walking, swimming, or biking.  Work with your health care provider or diet and nutrition specialist (dietitian) to adjust your eating plan to your individual calorie needs. Reading food labels  Check food labels for the amount of sodium per serving. Choose foods with less than 5 percent of the Daily Value of sodium. Generally, foods with less than 300 mg of sodium per serving fit into this eating plan.  To find whole grains, look for the word "whole" as the first word in the ingredient list. Shopping  Buy products labeled as "low-sodium" or "no salt added."  Buy fresh foods. Avoid canned foods and premade or frozen meals. Cooking  Avoid adding salt when cooking. Use salt-free seasonings or herbs instead of table salt or sea salt. Check with your health care provider or pharmacist before using salt substitutes.  Do not fry foods. Cook foods using healthy methods such as baking, boiling, grilling, and broiling instead.  Cook with  heart-healthy oils, such as olive, canola, soybean, or sunflower oil. Meal planning   Eat a balanced diet that includes: ? 5 or more servings of fruits and vegetables each day. At each meal, try to fill half of your plate with fruits and vegetables. ? Up to 6-8 servings of whole grains each day. ? Less than 6 oz of lean meat, poultry, or fish each day. A 3-oz serving of meat is about the same size as a deck of cards. One egg equals 1 oz. ? 2 servings of low-fat dairy each day. ? A serving of nuts, seeds, or beans 5 times each week. ? Heart-healthy fats. Healthy fats called Omega-3 fatty acids are found in foods such as flaxseeds and coldwater fish, like sardines, salmon, and mackerel.  Limit how much you eat of the following: ? Canned or prepackaged foods. ? Food that is high in trans fat, such as fried foods. ? Food that is high in saturated fat, such as fatty meat. ? Sweets, desserts, sugary drinks, and other foods with added sugar. ? Full-fat dairy products.  Do not salt foods before eating.  Try to eat at least 2 vegetarian meals each week.  Eat more home-cooked food and less restaurant, buffet, and fast food.  When eating at a restaurant, ask that your food be prepared with less salt or no salt, if possible. What foods are recommended? The items listed may not be a complete list. Talk with your dietitian about what   dietary choices are best for you. Grains Whole-grain or whole-wheat bread. Whole-grain or whole-wheat pasta. Brown rice. Oatmeal. Quinoa. Bulgur. Whole-grain and low-sodium cereals. Pita bread. Low-fat, low-sodium crackers. Whole-wheat flour tortillas. Vegetables Fresh or frozen vegetables (raw, steamed, roasted, or grilled). Low-sodium or reduced-sodium tomato and vegetable juice. Low-sodium or reduced-sodium tomato sauce and tomato paste. Low-sodium or reduced-sodium canned vegetables. Fruits All fresh, dried, or frozen fruit. Canned fruit in natural juice (without  added sugar). Meat and other protein foods Skinless chicken or turkey. Ground chicken or turkey. Pork with fat trimmed off. Fish and seafood. Egg whites. Dried beans, peas, or lentils. Unsalted nuts, nut butters, and seeds. Unsalted canned beans. Lean cuts of beef with fat trimmed off. Low-sodium, lean deli meat. Dairy Low-fat (1%) or fat-free (skim) milk. Fat-free, low-fat, or reduced-fat cheeses. Nonfat, low-sodium ricotta or cottage cheese. Low-fat or nonfat yogurt. Low-fat, low-sodium cheese. Fats and oils Soft margarine without trans fats. Vegetable oil. Low-fat, reduced-fat, or light mayonnaise and salad dressings (reduced-sodium). Canola, safflower, olive, soybean, and sunflower oils. Avocado. Seasoning and other foods Herbs. Spices. Seasoning mixes without salt. Unsalted popcorn and pretzels. Fat-free sweets. What foods are not recommended? The items listed may not be a complete list. Talk with your dietitian about what dietary choices are best for you. Grains Baked goods made with fat, such as croissants, muffins, or some breads. Dry pasta or rice meal packs. Vegetables Creamed or fried vegetables. Vegetables in a cheese sauce. Regular canned vegetables (not low-sodium or reduced-sodium). Regular canned tomato sauce and paste (not low-sodium or reduced-sodium). Regular tomato and vegetable juice (not low-sodium or reduced-sodium). Pickles. Olives. Fruits Canned fruit in a light or heavy syrup. Fried fruit. Fruit in cream or butter sauce. Meat and other protein foods Fatty cuts of meat. Ribs. Fried meat. Bacon. Sausage. Bologna and other processed lunch meats. Salami. Fatback. Hotdogs. Bratwurst. Salted nuts and seeds. Canned beans with added salt. Canned or smoked fish. Whole eggs or egg yolks. Chicken or turkey with skin. Dairy Whole or 2% milk, cream, and half-and-half. Whole or full-fat cream cheese. Whole-fat or sweetened yogurt. Full-fat cheese. Nondairy creamers. Whipped toppings.  Processed cheese and cheese spreads. Fats and oils Butter. Stick margarine. Lard. Shortening. Ghee. Bacon fat. Tropical oils, such as coconut, palm kernel, or palm oil. Seasoning and other foods Salted popcorn and pretzels. Onion salt, garlic salt, seasoned salt, table salt, and sea salt. Worcestershire sauce. Tartar sauce. Barbecue sauce. Teriyaki sauce. Soy sauce, including reduced-sodium. Steak sauce. Canned and packaged gravies. Fish sauce. Oyster sauce. Cocktail sauce. Horseradish that you find on the shelf. Ketchup. Mustard. Meat flavorings and tenderizers. Bouillon cubes. Hot sauce and Tabasco sauce. Premade or packaged marinades. Premade or packaged taco seasonings. Relishes. Regular salad dressings. Where to find more information:  National Heart, Lung, and Blood Institute: www.nhlbi.nih.gov  American Heart Association: www.heart.org Summary  The DASH eating plan is a healthy eating plan that has been shown to reduce high blood pressure (hypertension). It may also reduce your risk for type 2 diabetes, heart disease, and stroke.  With the DASH eating plan, you should limit salt (sodium) intake to 2,300 mg a day. If you have hypertension, you may need to reduce your sodium intake to 1,500 mg a day.  When on the DASH eating plan, aim to eat more fresh fruits and vegetables, whole grains, lean proteins, low-fat dairy, and heart-healthy fats.  Work with your health care provider or diet and nutrition specialist (dietitian) to adjust your eating plan to your individual   calorie needs. This information is not intended to replace advice given to you by your health care provider. Make sure you discuss any questions you have with your health care provider. Document Released: 11/27/2011 Document Revised: 12/01/2016 Document Reviewed: 12/01/2016 Elsevier Interactive Patient Education  [employer].

## 2018-03-23 NOTE — Discharge Summary (Signed)
[facility] NAME: Natalie Petersen    MR#:  [id number]  DATE OF BIRTH:  1976-07-04  DATE OF ADMISSION:  03/14/2018 ADMITTING PHYSICIAN: Epifanio Lesches, MD  DATE OF DISCHARGE: 03/15/2018  1:28 PM  PRIMARY CARE PHYSICIAN: Arnetha Courser, MD    ADMISSION DIAGNOSIS:  Angioedema, initial encounter [T78.3XXA]  DISCHARGE DIAGNOSIS:  Active Problems:   * No active hospital problems. *   SECONDARY DIAGNOSIS:   Past Medical History:  Diagnosis Date  . Allergy    seasonal  . Depression   . Menopausal symptoms   . Metrorrhagia   . Vitamin D deficiency     HOSPITAL COURSE:   42 year old female with past medical history of hypertension, seasonal allergies, depression who presented to the hospital due to facial flushing, swelling of her upper lip and noted to have angioedema.  Patient was recently started on lisinopril for hypertension.  1.  Angioedema-this was a clinical diagnosis and this was secondary to patient being on ace inhibitors as she was recently started on lisinopril. -Patient was treated supportively with IV steroids, Pepcid, Benadryl. -Patient has clinically improved with supportive therapy and her angioedema has improved.  She denies any shortness of breath or any airway compromise or any tongue swelling.  She is being discharged on a prednisone taper and epinephrine pen as needed.  2.  Essential hypertension-patient has been taken off the lisinopril given her angioedema.  Patient has been started on some low-dose hydrochlorothiazide and metoprolol which she will continue.  3.  Depression-patient will continue her Wellbutrin, Zoloft.  DISCHARGE CONDITIONS:   Stable  CONSULTS OBTAINED:    DRUG ALLERGIES:   Allergies  Allergen Reactions  . Ace Inhibitors Swelling  . Lisinopril Swelling  . Amoxicillin Hives and Swelling  . Benadryl [Diphenhydramine Hcl (Sleep)] Hives and Swelling    DISCHARGE MEDICATIONS:    Allergies as of 03/15/2018      Reactions   Ace Inhibitors Swelling   Lisinopril Swelling   Amoxicillin Hives, Swelling   Benadryl [diphenhydramine Hcl (sleep)] Hives, Swelling      Medication List    STOP taking these medications   famotidine 40 MG tablet Commonly known as:  PEPCID   lisinopril 10 MG tablet Commonly known as:  PRINIVIL,ZESTRIL   predniSONE 20 MG tablet Commonly known as:  DELTASONE     TAKE these medications   buPROPion 300 MG 24 hr tablet Commonly known as:  WELLBUTRIN XL Take 1 tablet (300 mg total) by mouth daily.   hydrochlorothiazide 25 MG tablet Commonly known as:  HYDRODIURIL Take 1 tablet (25 mg total) by mouth daily.   metoprolol tartrate 25 MG tablet Commonly known as:  LOPRESSOR Take 0.5 tablets (12.5 mg total) by mouth 2 (two) times daily.   norethindrone-ethinyl estradiol 1/35 tablet Commonly known as:  PIRMELLA 1/35 Take 1 tablet by mouth daily.   sertraline 25 MG tablet Commonly known as:  ZOLOFT Take 1 tablet (25 mg total) by mouth daily.   Vitamin D (Ergocalciferol) 50000 units Caps capsule Commonly known as:  DRISDOL Take 1 capsule (50,000 Units total) by mouth every 7 (seven) days. What changed:  when to take this     ASK your doctor about these medications   EPINEPHrine 0.3 mg/0.3 mL Soaj injection Commonly known as:  EPI-PEN Inject 0.3 mLs (0.3 mg total) into the muscle once for 1 dose. Ask about: Should I take this medication?  DISCHARGE INSTRUCTIONS:   DIET:  Cardiac diet  DISCHARGE CONDITION:  Stable  ACTIVITY:  Activity as tolerated  OXYGEN:  Home Oxygen: No.   Oxygen Delivery: room air  DISCHARGE LOCATION:  home   If you experience worsening of your admission symptoms, develop shortness of breath, life threatening emergency, suicidal or homicidal thoughts you must seek medical attention immediately by calling 911 or calling your MD immediately  if symptoms less severe.  You Must read  complete instructions/literature along with all the possible adverse reactions/side effects for all the Medicines you take and that have been prescribed to you. Take any new Medicines after you have completely understood and accpet all the possible adverse reactions/side effects.   Please note  You were cared for by a hospitalist during your hospital stay. If you have any questions about your discharge medications or the care you received while you were in the hospital after you are discharged, you can call the unit and asked to speak with the hospitalist on call if the hospitalist that took care of you is not available. Once you are discharged, your primary care physician will handle any further medical issues. Please note that NO REFILLS for any discharge medications will be authorized once you are discharged, as it is imperative that you return to your primary care physician (or establish a relationship with a primary care physician if you do not have one) for your aftercare needs so that they can reassess your need for medications and monitor your lab values.     Today   Still has some mild Facial flushing but otherwise much improved. No tongue swelling, shortness of breath or any other complaints.  Will d/c home today.   VITAL SIGNS:  Blood pressure (!) 147/85, pulse 76, temperature 98.4 F (36.9 C), temperature source Oral, resp. rate 19, height 5' 9"  (1.753 m), weight 132.1 kg (291 lb 3.2 oz), last menstrual period 02/20/2018, SpO2 98 %.  I/O:  No intake or output data in the 24 hours ending 03/23/18 1349  PHYSICAL EXAMINATION:   GENERAL:  42 y.o.-year-old obese patient lying in the bed with no acute distress.  EYES: Pupils equal, round, reactive to light and accommodation. No scleral icterus. Extraocular muscles intact.  HEENT: Head atraumatic, normocephalic. Oropharynx and nasopharynx clear.  NECK:  Supple, no jugular venous distention. No thyroid enlargement, no tenderness.   LUNGS: Normal breath sounds bilaterally, no wheezing, rales,rhonchi. No use of accessory muscles of respiration.  CARDIOVASCULAR: S1, S2 normal. No murmurs, rubs, or gallops.  ABDOMEN: Soft, non-tender, non-distended. Bowel sounds present. No organomegaly or mass.  EXTREMITIES: No pedal edema, cyanosis, or clubbing.  NEUROLOGIC: Cranial nerves II through XII are intact. No focal motor or sensory defecits b/l.  PSYCHIATRIC: The patient is alert and oriented x 3.  SKIN: No obvious rash, lesion, or ulcer.   DATA REVIEW:   CBC No results for input(s): WBC, HGB, HCT, PLT in the last 168 hours.  Chemistries  No results for input(s): NA, K, CL, CO2, GLUCOSE, BUN, CREATININE, CALCIUM, MG, AST, ALT, ALKPHOS, BILITOT in the last 168 hours.  Invalid input(s): GFRCGP  Cardiac Enzymes No results for input(s): TROPONINI in the last 168 hours.   RADIOLOGY:  No results found.    Management plans discussed with the patient, family and they are in agreement.  CODE STATUS:  Code Status History    Date Active Date Inactive Code Status Order ID Comments User Context   03/14/2018 1159 03/15/2018 1633 Full Code  [id number]  Epifanio Lesches, MD ED      TOTAL TIME TAKING CARE OF THIS PATIENT: 40 minutes.    Henreitta Leber M.D on 03/23/2018 at 1:49 PM  Between 7am to 6pm - Pager - [phone]  After 6pm go to [email or web address] - Proofreader  [facility]  Office  [phone]  CC: Primary care physician; Arnetha Courser, MD

## 2018-03-24 ENCOUNTER — Ambulatory Visit (INDEPENDENT_AMBULATORY_CARE_PROVIDER_SITE_OTHER): Payer: 59 | Admitting: Physician Assistant

## 2018-03-24 VITALS — BP 118/78 | HR 62 | Temp 97.8°F | Ht 69.0 in | Wt 285.0 lb

## 2018-03-24 DIAGNOSIS — Z9189 Other specified personal risk factors, not elsewhere classified: Secondary | ICD-10-CM | POA: Diagnosis not present

## 2018-03-24 DIAGNOSIS — E559 Vitamin D deficiency, unspecified: Secondary | ICD-10-CM | POA: Diagnosis not present

## 2018-03-24 DIAGNOSIS — Z6841 Body Mass Index (BMI) 40.0 and over, adult: Secondary | ICD-10-CM

## 2018-03-24 DIAGNOSIS — I10 Essential (primary) hypertension: Secondary | ICD-10-CM | POA: Diagnosis not present

## 2018-03-24 MED ORDER — VITAMIN D (ERGOCALCIFEROL) 1.25 MG (50000 UNIT) PO CAPS: 50000.0000 [IU] | ORAL_CAPSULE | ORAL | 0 refills | Status: DC

## 2018-03-24 NOTE — Addendum Note (Signed)
Addended by: Fredderick Severance on: 03/24/2018 01:43 PM   Modules accepted: Level of Service

## 2018-03-24 NOTE — Progress Notes (Signed)
Office: [phone]  /  Fax: [phone]   HPI:   Chief Complaint: OBESITY Natalie Petersen is here to discuss her progress with her obesity treatment plan. She is on the Category 3 plan and is following her eating plan approximately 95 % of the time. She states she is exercising 0 minutes 0 times per week. [name] continues to do well with weight loss. She continues to keep up with her eating and states her hunger is well controlled.  Her weight is 285 lb (129.3 kg) today and has had a weight loss of 7 pounds over a period of 2 weeks since her last visit. She has lost 16 lbs since starting treatment with [place].  Vitamin D deficiency Natalie Petersen has a diagnosis of vitamin D deficiency. She is currently taking vit D and denies nausea, vomiting or muscle weakness.  Hypertension Natalie Petersen is a 42 y.o. female with hypertension. Her blood pressure is elevated. She was started on lisinopril and developed angioedema and was in the hospital for one day. She was switched to HCTZ and metoprolol and states she feels fatigue. Her heart rate is on the lower end at 62. She is scheduled to follow up with her PCP  In one week  Natalie Petersen denies chest pain or shortness of breath on exertion. She is working weight loss to help control her blood pressure with the goal of decreasing her risk of heart attack and stroke. [name] blood pressure is not currently controlled.  At risk for cardiovascular disease Natalie Petersen is at a higher than average risk for cardiovascular disease due to obesity and hypertension. She currently denies any chest pain.  ALLERGIES: Allergies  Allergen Reactions  . Ace Inhibitors Swelling  . Lisinopril Swelling  . Amoxicillin Hives and Swelling  . Benadryl [Diphenhydramine Hcl (Sleep)] Hives and Swelling    MEDICATIONS: Current Outpatient Medications on File Prior to Visit  Medication Sig Dispense Refill  . buPROPion (WELLBUTRIN XL) 300 MG 24 hr tablet Take 1 tablet (300 mg total) by mouth daily. 90  tablet 3  . EPINEPHrine 0.3 mg/0.3 mL IJ SOAJ injection Inject 0.3 mg into the muscle once.     . hydrochlorothiazide (HYDRODIURIL) 25 MG tablet Take 1 tablet (25 mg total) by mouth daily. 30 tablet 1  . metoprolol tartrate (LOPRESSOR) 25 MG tablet Take 0.5 tablets (12.5 mg total) by mouth 2 (two) times daily. 30 tablet 1  . sertraline (ZOLOFT) 25 MG tablet Take 1 tablet (25 mg total) by mouth daily. 90 tablet 3  . norethindrone-ethinyl estradiol 1/35 ([place] 1/35) tablet Take 1 tablet by mouth daily. 3 Package 3   No current facility-administered medications on file prior to visit.     PAST MEDICAL HISTORY: Past Medical History:  Diagnosis Date  . Allergy    seasonal  . Depression   . Menopausal symptoms   . Metrorrhagia   . Vitamin D deficiency     PAST SURGICAL HISTORY: Past Surgical History:  Procedure Laterality Date  . BRAIN SURGERY      SOCIAL HISTORY: Social History   Tobacco Use  . Smoking status: Never Smoker  . Smokeless tobacco: Never Used  Substance Use Topics  . Alcohol use: Not Currently    Alcohol/week: 0.0 oz    Frequency: Never  . Drug use: No    FAMILY HISTORY: Family History  Problem Relation Age of Onset  . Heart murmur Mother   . Atrial fibrillation Mother   . Arthritis Father  RA  . Hyperlipidemia Father   . Cancer Maternal Aunt        ovarian and uterine   . Arthritis Maternal Grandmother   . Heart attack Maternal Grandfather   . Stroke Paternal Grandmother   . Arthritis Paternal Grandmother   . Breast cancer Paternal Grandmother   . Stroke Paternal Uncle     ROS: Review of Systems  Constitutional: Positive for malaise/fatigue and weight loss.  Respiratory: Negative for shortness of breath (on exertion).   Cardiovascular: Negative for chest pain.  Gastrointestinal: Negative for nausea and vomiting.  Musculoskeletal:       Negative for muscle weakness    PHYSICAL EXAM: Blood pressure 118/78, pulse 62, temperature 97.8  F (36.6 C), temperature source Oral, height 5' 9"  (1.753 m), weight 285 lb (129.3 kg), SpO2 99 %. Body mass index is 42.09 kg/m. Physical Exam  Constitutional: She is oriented to person, place, and time. She appears well-developed and well-nourished.  Cardiovascular: Normal rate.  Pulmonary/Chest: Effort normal.  Musculoskeletal: Normal range of motion.  Neurological: She is oriented to person, place, and time.  Skin: Skin is warm and dry.  Psychiatric: She has a normal mood and affect. Her behavior is normal.  Vitals reviewed.   RECENT LABS AND TESTS: BMET    Component Value Date/Time   NA 139 03/15/2018 0311   NA 138 02/11/2018 1148   K 3.8 03/15/2018 0311   CL 106 03/15/2018 0311   CO2 25 03/15/2018 0311   GLUCOSE 152 (H) 03/15/2018 0311   BUN 11 03/15/2018 0311   BUN 9 02/11/2018 1148   CREATININE 0.68 03/15/2018 0311   CREATININE 0.83 10/13/2017 0940   CALCIUM 8.8 (L) 03/15/2018 0311   GFRNONAA >60 03/15/2018 0311   GFRNONAA 87 10/13/2017 0940   GFRAA >60 03/15/2018 0311   GFRAA 101 10/13/2017 0940   Lab Results  Component Value Date   HGBA1C 5.5 02/11/2018   HGBA1C 5.8 01/23/2014   Lab Results  Component Value Date   INSULIN 16.9 02/11/2018   CBC    Component Value Date/Time   WBC 17.4 (H) 03/15/2018 0311   RBC 4.72 03/15/2018 0311   HGB 12.8 03/15/2018 0311   HGB 13.7 02/11/2018 1148   HCT 39.6 03/15/2018 0311   HCT 40.2 02/11/2018 1148   PLT 354 03/15/2018 0311   MCV 84.0 03/15/2018 0311   MCV 81 02/11/2018 1148   MCH 27.2 03/15/2018 0311   MCHC 32.4 03/15/2018 0311   RDW 14.5 03/15/2018 0311   RDW 14.5 02/11/2018 1148   LYMPHSABS 1.0 03/14/2018 0857   LYMPHSABS 2.6 02/11/2018 1148   MONOABS 0.3 03/14/2018 0857   EOSABS 0.0 03/14/2018 0857   EOSABS 0.1 02/11/2018 1148   BASOSABS 0.1 03/14/2018 0857   BASOSABS 0.0 02/11/2018 1148   Iron/TIBC/Ferritin/ %Sat No results found for: IRON, TIBC, FERRITIN, IRONPCTSAT Lipid Panel     Component  Value Date/Time   CHOL 199 02/11/2018 1148   TRIG 121 02/11/2018 1148   HDL 71 02/11/2018 1148   CHOLHDL 2.7 10/13/2017 0940   VLDL 20 09/09/2016 0941   LDLCALC 104 (H) 02/11/2018 1148   LDLCALC 127 (H) 10/13/2017 0940   Hepatic Function Panel     Component Value Date/Time   PROT 6.9 02/11/2018 1148   ALBUMIN 4.1 02/11/2018 1148   AST 15 02/11/2018 1148   ALT 13 02/11/2018 1148   ALKPHOS 92 02/11/2018 1148   BILITOT 0.3 02/11/2018 1148      Component Value Date/Time  TSH 3.640 02/11/2018 1148   TSH 4.80 (H) 10/13/2017 0940   TSH 4.49 09/09/2016 0941   Results for SAGA, BALTHAZAR (MRN [id number]) as of 03/24/2018 11:25  Ref. Range 02/11/2018 11:48  Vitamin D, 25-Hydroxy Latest Ref Range: 30.0 - 100.0 ng/mL 13.5 (L)   ASSESSMENT AND PLAN: Vitamin D deficiency - Plan: Vitamin D, Ergocalciferol, (DRISDOL) 50000 units CAPS capsule  Essential hypertension  At risk for heart disease  Class 3 severe obesity with serious comorbidity and body mass index (BMI) of 40.0 to 44.9 in adult, unspecified obesity type (HCC)  PLAN:  Vitamin D Deficiency Natalie Petersen was informed that low vitamin D levels contributes to fatigue and are associated with obesity, breast, and colon cancer. She agrees to continue to take prescription Vit D @50 ,000 IU every week #4 with no refills and will follow up for routine testing of vitamin D, at least 2-3 times per year. She was informed of the risk of over-replacement of vitamin D and agrees to not increase her dose unless she discusses this with [place] first. Mychal agrees to follow up with our clinic in 2 weeks.  Hypertension We discussed sodium restriction, working on healthy weight loss, and a regular exercise program as the means to achieve improved blood pressure control. Natalie Petersen agreed with this plan and agreed to follow up as directed. We will continue to monitor her blood pressure as well as her progress with the above lifestyle modifications. She will continue her  medications as prescribed and will watch for signs of hypotension as she continues her lifestyle modifications. Natalie Petersen will follow up with her PCP as planned.  Cardiovascular risk counseling Natalie Petersen was given extended (15 minutes) coronary artery disease prevention counseling today. She is 42 y.o. female and has risk factors for heart disease including obesity and hypertension. We discussed intensive lifestyle modifications today with an emphasis on specific weight loss instructions and strategies. Pt was also informed of the importance of increasing exercise and decreasing saturated fats to help prevent heart disease.  Obesity Natalie Petersen is currently in the action stage of change. As such, her goal is to continue with weight loss efforts She has agreed to follow the Category 3 plan Natalie Petersen has been instructed to work up to a goal of 150 minutes of combined cardio and strengthening exercise per week for weight loss and overall health benefits. We discussed the following Behavioral Modification Strategies today: increasing lean protein intake and work on meal planning and easy cooking plans  Natalie Petersen has agreed to follow up with our clinic in 2 weeks. She was informed of the importance of frequent follow up visits to maximize her success with intensive lifestyle modifications for her multiple health conditions.   OBESITY BEHAVIORAL INTERVENTION VISIT  Today's visit was # 4 out of 22.  Starting weight: 301 lbs Starting date: 02/11/18 Today's weight : 285 lbs  Today's date: 03/24/2018 Total lbs lost to date: 29 (Patients must lose 7 lbs in the first 6 months to continue with counseling)   ASK: We discussed the diagnosis of obesity with Natalie Petersen today and Natalie Petersen agreed to give [place] permission to discuss obesity behavioral modification therapy today.  ASSESS: Natalie Petersen has the diagnosis of obesity and her BMI today is 42.07 Natalie Petersen is in the action stage of change   ADVISE: Natalie Petersen was educated on the  multiple health risks of obesity as well as the benefit of weight loss to improve her health. She was advised of the need for long term treatment  and the importance of lifestyle modifications.  AGREE: Multiple dietary modification options and treatment options were discussed and  Natalie Petersen agreed to the above obesity treatment plan.   Corey Skains, am acting as transcriptionist for [employer], PA-C I, Lacy Duverney [facility], have reviewed this note and agree with its content

## 2018-03-25 ENCOUNTER — Encounter: Payer: Self-pay | Admitting: Family Medicine

## 2018-03-30 ENCOUNTER — Ambulatory Visit (INDEPENDENT_AMBULATORY_CARE_PROVIDER_SITE_OTHER): Payer: 59 | Admitting: Family Medicine

## 2018-03-30 ENCOUNTER — Encounter: Payer: Self-pay | Admitting: Family Medicine

## 2018-03-30 VITALS — BP 128/78 | HR 84 | Temp 98.6°F | Resp 14 | Ht 69.0 in | Wt 288.0 lb

## 2018-03-30 DIAGNOSIS — R002 Palpitations: Secondary | ICD-10-CM

## 2018-03-30 DIAGNOSIS — I10 Essential (primary) hypertension: Secondary | ICD-10-CM | POA: Diagnosis not present

## 2018-03-30 MED ORDER — CARVEDILOL 3.125 MG PO TABS: 3.1250 mg | ORAL_TABLET | Freq: Two times a day (BID) | ORAL | 3 refills | Status: DC

## 2018-03-30 NOTE — Progress Notes (Signed)
BP 128/78   Pulse 84   Temp 98.6 F (37 C) (Oral)   Resp 14   Ht 5' 9"  (1.753 m)   Wt 288 lb (130.6 kg)   SpO2 96%   BMI 42.53 kg/m    Subjective:    Patient ID: Natalie Petersen, female    DOB: September 16, 1976, 42 y.o.   MRN: [id number]  HPI: Natalie Petersen is a 42 y.o. female  Chief Complaint  Patient presents with  . Follow-up    1 week  . Pruritis    HPI Patient is here for hypertension She was seen at weight loss clinic and was started on ACE-I; she then got angioedema and went to the hospital Medicine was adjusted Pulse will get low, into the 50s and 60s and then feels tired Mother has atrial fibrillation, but good BP; father has good BP Blood pressure goes up and she thought she had white coat HTN She does have palpitations; almost feels like your stomach growls and then like butterflies; can just be sitting and feeling a fluttery feeling in her chest; mother has those too; discussed heart monitor Her glucose readings were high on labs, but fingerstick back to 93; on prednisone K+ was normal; good fruits and veggies Going to weight clinic and getting yogurt, lean proteins WBC was high because of prednisone; no sx of pneumonia or bladder infection Vitamin D was 13.5 back in February; taking 50k iu weekly for that; energy is not really better because the new meds are bothering her BP at home 135/88; fluctuates through the day She is on wellbutrin and zoloft next week; gets anxious for no reason; sees psych next week Not adding salt to her food; not taking decongestants Quit caffeine about 2.5 weeks ago; fluttering was going on before that Going to see heart doctor at [facility] soon Allergies are bothering her; she wants to see if she can take allegra She is losing weight, 20 pounds now  Depression screen [facility] 2/9 02/11/2018 10/13/2017 09/09/2016 07/16/2015  Decreased Interest 3 0 0 0  Down, Depressed, Hopeless 2 0 0 0  PHQ - 2 Score 5 0 0 0  Altered sleeping 1 - - -  Tired,  decreased energy 3 - - -  Change in appetite 2 - - -  Feeling bad or failure about yourself  1 - - -  Trouble concentrating 0 - - -  Moving slowly or fidgety/restless 0 - - -  Suicidal thoughts 0 - - -  PHQ-9 Score 12 - - -  Difficult doing work/chores Not difficult at all - - -    Relevant past medical, surgical, family and social history reviewed Past Medical History:  Diagnosis Date  . Allergy    seasonal  . Depression   . Menopausal symptoms   . Metrorrhagia   . Vitamin D deficiency    Past Surgical History:  Procedure Laterality Date  . BRAIN SURGERY     Family History  Problem Relation Age of Onset  . Heart murmur Mother   . Atrial fibrillation Mother   . Arthritis Father        RA  . Hyperlipidemia Father   . Cancer Maternal Aunt        ovarian and uterine   . Arthritis Maternal Grandmother   . Heart attack Maternal Grandfather   . Stroke Paternal Grandmother   . Arthritis Paternal Grandmother   . Breast cancer Paternal Grandmother   . Stroke Paternal Uncle  Social History   Tobacco Use  . Smoking status: Never Smoker  . Smokeless tobacco: Never Used  Substance Use Topics  . Alcohol use: Not Currently    Alcohol/week: 0.0 oz    Frequency: Never  . Drug use: No    Interim medical history since last visit reviewed. Allergies and medications reviewed  Review of Systems Per HPI unless specifically indicated above     Objective:    BP 128/78   Pulse 84   Temp 98.6 F (37 C) (Oral)   Resp 14   Ht 5' 9"  (1.753 m)   Wt 288 lb (130.6 kg)   SpO2 96%   BMI 42.53 kg/m   Wt Readings from Last 3 Encounters:  03/30/18 288 lb (130.6 kg)  03/24/18 285 lb (129.3 kg)  03/22/18 286 lb 4.8 oz (129.9 kg)    Physical Exam  Constitutional: She appears well-developed and well-nourished.  Morbidly obese  HENT:  Mouth/Throat: Mucous membranes are normal.  Eyes: EOM are normal. No scleral icterus.  Cardiovascular: Normal rate and regular rhythm.  No  extrasystoles are present.  Pulmonary/Chest: Effort normal and breath sounds normal. No respiratory distress.  Neurological: She is alert. She displays no tremor.  Psychiatric: She has a normal mood and affect. Her behavior is normal.   Results for orders placed or performed during the hospital encounter of 12/14/81  Basic metabolic panel  Result Value Ref Range   Sodium 135 135 - 145 mmol/L   Potassium 3.8 3.5 - 5.1 mmol/L   Chloride 103 101 - 111 mmol/L   CO2 21 (L) 22 - 32 mmol/L   Glucose, Bld 170 (H) 65 - 99 mg/dL   BUN 10 6 - 20 mg/dL   Creatinine, Ser 0.75 0.44 - 1.00 mg/dL   Calcium 8.7 (L) 8.9 - 10.3 mg/dL   GFR calc non Af Amer >60 >60 mL/min   GFR calc Af Amer >60 >60 mL/min   Anion gap 11 5 - 15  CBC with Differential  Result Value Ref Range   WBC 15.3 (H) 3.6 - 11.0 K/uL   RBC 4.85 3.80 - 5.20 MIL/uL   Hemoglobin 13.4 12.0 - 16.0 g/dL   HCT 40.6 35.0 - 47.0 %   MCV 83.8 80.0 - 100.0 fL   MCH 27.7 26.0 - 34.0 pg   MCHC 33.1 32.0 - 36.0 g/dL   RDW 14.2 11.5 - 14.5 %   Platelets 394 150 - 440 K/uL   Neutrophils Relative % 91 %   Neutro Abs 13.9 (H) 1.4 - 6.5 K/uL   Lymphocytes Relative 7 %   Lymphs Abs 1.0 1.0 - 3.6 K/uL   Monocytes Relative 2 %   Monocytes Absolute 0.3 0.2 - 0.9 K/uL   Eosinophils Relative 0 %   Eosinophils Absolute 0.0 0 - 0.7 K/uL   Basophils Relative 0 %   Basophils Absolute 0.1 0 - 0.1 K/uL  HIV antibody (Routine Testing)  Result Value Ref Range   HIV Screen 4th Generation wRfx Non Reactive Non Reactive  Basic metabolic panel  Result Value Ref Range   Sodium 139 135 - 145 mmol/L   Potassium 3.8 3.5 - 5.1 mmol/L   Chloride 106 101 - 111 mmol/L   CO2 25 22 - 32 mmol/L   Glucose, Bld 152 (H) 65 - 99 mg/dL   BUN 11 6 - 20 mg/dL   Creatinine, Ser 0.68 0.44 - 1.00 mg/dL   Calcium 8.8 (L) 8.9 - 10.3 mg/dL  GFR calc non Af Amer >60 >60 mL/min   GFR calc Af Amer >60 >60 mL/min   Anion gap 8 5 - 15  CBC  Result Value Ref Range   WBC  17.4 (H) 3.6 - 11.0 K/uL   RBC 4.72 3.80 - 5.20 MIL/uL   Hemoglobin 12.8 12.0 - 16.0 g/dL   HCT 39.6 35.0 - 47.0 %   MCV 84.0 80.0 - 100.0 fL   MCH 27.2 26.0 - 34.0 pg   MCHC 32.4 32.0 - 36.0 g/dL   RDW 14.5 11.5 - 14.5 %   Platelets 354 150 - 440 K/uL  Glucose, capillary  Result Value Ref Range   Glucose-Capillary 93 65 - 99 mg/dL   Comment 1 Notify RN       Assessment & Plan:   Problem List Items Addressed This Visit      Cardiovascular and Mediastinum   Essential hypertension, benign    Patient decribes fluctuations in BP; try DASH guidelines; avoid decongestants; will use plain allergy medicine if needed; HCTZ if needed for BP over 140/90; weight loss will help too; patient is comfortable with monitoring her own BP and pulse and notifying me of ups or down      Relevant Medications   carvedilol (COREG) 3.125 MG tablet     Other   Obesity, Class III, BMI 40-49.9 (morbid obesity) ([place])    Working with clinc; succeeding in her weight loss efforts, encouragement given       Other Visit Diagnoses    Palpitations    -  Primary   patient to see cardiologist soon; EKG reviewed; K+ reviewed; will order cardiac event monitor; change to coreg   Relevant Orders   Cardiac event monitor       Follow up plan: Return if symptoms worsen or fail to improve.  An after-visit summary was printed and given to the patient at [facility].  Please see the patient instructions which may contain other information and recommendations beyond what is mentioned above in the assessment and plan.  Meds ordered this encounter  Medications  . carvedilol (COREG) 3.125 MG tablet    Sig: Take 1 tablet (3.125 mg total) by mouth 2 (two) times daily with a meal.    Dispense:  60 tablet    Refill:  3    Orders Placed This Encounter  Procedures  . Cardiac event monitor

## 2018-03-30 NOTE — Assessment & Plan Note (Signed)
Working with clinc; succeeding in her weight loss efforts, encouragement given

## 2018-03-30 NOTE — Assessment & Plan Note (Signed)
Patient decribes fluctuations in BP; try DASH guidelines; avoid decongestants; will use plain allergy medicine if needed; HCTZ if needed for BP over 140/90; weight loss will help too; patient is comfortable with monitoring her own BP and pulse and notifying me of ups or down

## 2018-03-30 NOTE — Patient Instructions (Addendum)
Try to use PLAIN allergy medicine without the decongestant Avoid: phenylephrine, phenylpropanolamine, and pseudoephredine  Try to follow the DASH guidelines (DASH stands for Dietary Approaches to Stop Hypertension). Try to limit the sodium in your diet to no more than 1,556m of sodium per day. Certainly try to not exceed 2,000 mg per day at the very most. Do not add salt when cooking or at the table.  Check the sodium amount on labels when shopping, and choose items lower in sodium when given a choice. Avoid or limit foods that already contain a lot of sodium. Eat a diet rich in fruits and vegetables and whole grains, and try to lose weight if overweight or obese  Start the carvedilol tonight about 12 hours after the last metoprolol dose If your blood pressure goes over 140, okay to take 1/2 or 1 pill of the HCTZ Call with any issues I'll suggest 250 mg of magnesium oxide daily I've ordered a heart monitor and someone should contact you about that soon If you have not heard anything from my staff in a week about any orders/referrals/studies from today, please contact [facility] to follow-up [phone]  DASH Eating Plan DASH stands for "Dietary Approaches to Stop Hypertension." The DASH eating plan is a healthy eating plan that has been shown to reduce high blood pressure (hypertension). It may also reduce your risk for type 2 diabetes, heart disease, and stroke. The DASH eating plan may also help with weight loss. What are tips for following this plan? General guidelines  Avoid eating more than 2,300 mg (milligrams) of salt (sodium) a day. If you have hypertension, you may need to reduce your sodium intake to 1,500 mg a day.  Limit alcohol intake to no more than 1 drink a day for nonpregnant women and 2 drinks a day for men. One drink equals 12 oz of beer, 5 oz of wine, or 1 oz of hard liquor.  Work with your health care provider to maintain a healthy body weight or to lose weight. Ask what  an ideal weight is for you.  Get at least 30 minutes of exercise that causes your heart to beat faster (aerobic exercise) most days of the week. Activities may include walking, swimming, or biking.  Work with your health care provider or diet and nutrition specialist (dietitian) to adjust your eating plan to your individual calorie needs. Reading food labels  Check food labels for the amount of sodium per serving. Choose foods with less than 5 percent of the Daily Value of sodium. Generally, foods with less than 300 mg of sodium per serving fit into this eating plan.  To find whole grains, look for the word "whole" as the first word in the ingredient list. Shopping  Buy products labeled as "low-sodium" or "no salt added."  Buy fresh foods. Avoid canned foods and premade or frozen meals. Cooking  Avoid adding salt when cooking. Use salt-free seasonings or herbs instead of table salt or sea salt. Check with your health care provider or pharmacist before using salt substitutes.  Do not fry foods. Cook foods using healthy methods such as baking, boiling, grilling, and broiling instead.  Cook with heart-healthy oils, such as olive, canola, soybean, or sunflower oil. Meal planning   Eat a balanced diet that includes: ? 5 or more servings of fruits and vegetables each day. At each meal, try to fill half of your plate with fruits and vegetables. ? Up to 6-8 servings of whole grains  each day. ? Less than 6 oz of lean meat, poultry, or fish each day. A 3-oz serving of meat is about the same size as a deck of cards. One egg equals 1 oz. ? 2 servings of low-fat dairy each day. ? A serving of nuts, seeds, or beans 5 times each week. ? Heart-healthy fats. Healthy fats called Omega-3 fatty acids are found in foods such as flaxseeds and coldwater fish, like sardines, salmon, and mackerel.  Limit how much you eat of the following: ? Canned or prepackaged foods. ? Food that is high in trans fat,  such as fried foods. ? Food that is high in saturated fat, such as fatty meat. ? Sweets, desserts, sugary drinks, and other foods with added sugar. ? Full-fat dairy products.  Do not salt foods before eating.  Try to eat at least 2 vegetarian meals each week.  Eat more home-cooked food and less restaurant, buffet, and fast food.  When eating at a restaurant, ask that your food be prepared with less salt or no salt, if possible. What foods are recommended? The items listed may not be a complete list. Talk with your dietitian about what dietary choices are best for you. Grains Whole-grain or whole-wheat bread. Whole-grain or whole-wheat pasta. Brown rice. [place]. Bulgur. Whole-grain and low-sodium cereals. Pita bread. Low-fat, low-sodium crackers. Whole-wheat flour tortillas. Vegetables Fresh or frozen vegetables (raw, steamed, roasted, or grilled). Low-sodium or reduced-sodium tomato and vegetable juice. Low-sodium or reduced-sodium tomato sauce and tomato paste. Low-sodium or reduced-sodium canned vegetables. Fruits All fresh, dried, or frozen fruit. Canned fruit in natural juice (without added sugar). Meat and other protein foods Skinless chicken or [place]. Ground chicken or [place]. Pork with fat trimmed off. Fish and seafood. Egg whites. Dried beans, peas, or lentils. Unsalted nuts, nut butters, and seeds. Unsalted canned beans. Lean cuts of beef with fat trimmed off. Low-sodium, lean deli meat. Dairy Low-fat (1%) or fat-free (skim) milk. Fat-free, low-fat, or reduced-fat cheeses. Nonfat, low-sodium ricotta or cottage cheese. Low-fat or nonfat yogurt. Low-fat, low-sodium cheese. Fats and oils Soft margarine without trans fats. Vegetable oil. Low-fat, reduced-fat, or light mayonnaise and salad dressings (reduced-sodium). Canola, safflower, olive, soybean, and sunflower oils. Avocado. Seasoning and other foods Herbs. Spices. Seasoning mixes without salt. Unsalted popcorn and  pretzels. Fat-free sweets. What foods are not recommended? The items listed may not be a complete list. Talk with your dietitian about what dietary choices are best for you. Grains Baked goods made with fat, such as croissants, muffins, or some breads. Dry pasta or rice meal packs. Vegetables Creamed or fried vegetables. Vegetables in a cheese sauce. Regular canned vegetables (not low-sodium or reduced-sodium). Regular canned tomato sauce and paste (not low-sodium or reduced-sodium). Regular tomato and vegetable juice (not low-sodium or reduced-sodium). Angie Fava. Olives. Fruits Canned fruit in a light or heavy syrup. Fried fruit. Fruit in cream or butter sauce. Meat and other protein foods Fatty cuts of meat. Ribs. Fried meat. Berniece Salines. Sausage. Bologna and other processed lunch meats. Salami. Fatback. Hotdogs. Bratwurst. Salted nuts and seeds. Canned beans with added salt. Canned or smoked fish. Whole eggs or egg yolks. Chicken or [place] with skin. Dairy Whole or 2% milk, cream, and half-and-half. Whole or full-fat cream cheese. Whole-fat or sweetened yogurt. Full-fat cheese. Nondairy creamers. Whipped toppings. Processed cheese and cheese spreads. Fats and oils Butter. Stick margarine. Lard. Shortening. Ghee. Bacon fat. Tropical oils, such as coconut, palm kernel, or palm oil. Seasoning and other foods Salted popcorn and  pretzels. Onion salt, garlic salt, seasoned salt, table salt, and sea salt. Worcestershire sauce. Tartar sauce. Barbecue sauce. Teriyaki sauce. Soy sauce, including reduced-sodium. Steak sauce. Canned and packaged gravies. Fish sauce. Oyster sauce. Cocktail sauce. Horseradish that you find on the shelf. Ketchup. Mustard. Meat flavorings and tenderizers. Bouillon cubes. Hot sauce and Tabasco sauce. Premade or packaged marinades. Premade or packaged taco seasonings. Relishes. Regular salad dressings. Where to find more information:  National Heart, Lung, and [place]:  https://wilson-eaton.com/  American Heart Association: www.heart.org Summary  The DASH eating plan is a healthy eating plan that has been shown to reduce high blood pressure (hypertension). It may also reduce your risk for type 2 diabetes, heart disease, and stroke.  With the DASH eating plan, you should limit salt (sodium) intake to 2,300 mg a day. If you have hypertension, you may need to reduce your sodium intake to 1,500 mg a day.  When on the DASH eating plan, aim to eat more fresh fruits and vegetables, whole grains, lean proteins, low-fat dairy, and heart-healthy fats.  Work with your health care provider or diet and nutrition specialist (dietitian) to adjust your eating plan to your individual calorie needs. This information is not intended to replace advice given to you by your health care provider. Make sure you discuss any questions you have with your health care provider. Document Released: 11/27/2011 Document Revised: 12/01/2016 Document Reviewed: 12/01/2016 Elsevier Interactive Patient Education  [employer].

## 2018-03-31 ENCOUNTER — Encounter: Payer: Self-pay | Admitting: Family Medicine

## 2018-04-03 ENCOUNTER — Encounter (INDEPENDENT_AMBULATORY_CARE_PROVIDER_SITE_OTHER): Payer: Self-pay | Admitting: Physician Assistant

## 2018-04-05 ENCOUNTER — Other Ambulatory Visit: Payer: Self-pay | Admitting: Psychiatry

## 2018-04-05 ENCOUNTER — Encounter: Payer: Self-pay | Admitting: Psychiatry

## 2018-04-05 ENCOUNTER — Ambulatory Visit (INDEPENDENT_AMBULATORY_CARE_PROVIDER_SITE_OTHER): Payer: 59 | Admitting: Psychiatry

## 2018-04-05 ENCOUNTER — Other Ambulatory Visit: Payer: Self-pay

## 2018-04-05 VITALS — BP 167/106 | HR 90 | Temp 97.4°F | Wt 295.6 lb

## 2018-04-05 DIAGNOSIS — F411 Generalized anxiety disorder: Secondary | ICD-10-CM

## 2018-04-05 DIAGNOSIS — F330 Major depressive disorder, recurrent, mild: Secondary | ICD-10-CM

## 2018-04-05 MED ORDER — SERTRALINE HCL 50 MG PO TABS: 50.0000 mg | ORAL_TABLET | Freq: Every day | ORAL | 1 refills | Status: DC

## 2018-04-05 NOTE — Patient Instructions (Signed)
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
## 2018-04-06 ENCOUNTER — Telehealth: Payer: Self-pay

## 2018-04-06 ENCOUNTER — Encounter: Payer: Self-pay | Admitting: Family Medicine

## 2018-04-06 MED ORDER — AMLODIPINE BESYLATE 5 MG PO TABS: 5.0000 mg | ORAL_TABLET | Freq: Every day | ORAL | 5 refills | Status: DC

## 2018-04-06 MED ORDER — HYDROCHLOROTHIAZIDE 25 MG PO TABS: 25.0000 mg | ORAL_TABLET | Freq: Every day | ORAL | 0 refills | Status: DC

## 2018-04-06 NOTE — Addendum Note (Signed)
Addended by: LADA, Satira Anis on: 04/06/2018 12:01 PM   Modules accepted: Orders

## 2018-04-06 NOTE — Telephone Encounter (Signed)
Received a refill request for pt Hydrochlorothiazide from [facility]. However the Rx was just sent in for 30 days to [facility]. Pt would like to keep it at [facility] for this particular Refill but would like to use [facility] in the future.

## 2018-04-07 ENCOUNTER — Ambulatory Visit (INDEPENDENT_AMBULATORY_CARE_PROVIDER_SITE_OTHER): Payer: 59 | Admitting: Physician Assistant

## 2018-04-07 ENCOUNTER — Other Ambulatory Visit: Payer: Self-pay | Admitting: Family Medicine

## 2018-04-07 VITALS — BP 136/87 | HR 87 | Temp 98.4°F | Ht 69.0 in | Wt 281.0 lb

## 2018-04-07 DIAGNOSIS — I10 Essential (primary) hypertension: Secondary | ICD-10-CM

## 2018-04-07 DIAGNOSIS — Z6841 Body Mass Index (BMI) 40.0 and over, adult: Secondary | ICD-10-CM | POA: Diagnosis not present

## 2018-04-07 DIAGNOSIS — Z9189 Other specified personal risk factors, not elsewhere classified: Secondary | ICD-10-CM

## 2018-04-07 DIAGNOSIS — E559 Vitamin D deficiency, unspecified: Secondary | ICD-10-CM | POA: Diagnosis not present

## 2018-04-07 MED ORDER — VITAMIN D (ERGOCALCIFEROL) 1.25 MG (50000 UNIT) PO CAPS: 50000.0000 [IU] | ORAL_CAPSULE | ORAL | 0 refills | Status: DC

## 2018-04-07 NOTE — Progress Notes (Addendum)
Office: [phone]  /  Fax: [phone]   HPI:   Chief Complaint: OBESITY Natalie Petersen is here to discuss her progress with her obesity treatment plan. She is on the Category 3 plan and is following her eating plan approximately 90-95 % of the time. She states she is walking for 15 minutes 2-3 times per week. Katerra continues to do well with weight loss. She states her hunger and cravings are well controlled. She is pleased with her weight loss.  Her weight is 281 lb (127.5 kg) today and has had a weight loss of 4 pounds over a period of 2 weeks since her last visit. She has lost 20 lbs since starting treatment with [place].  Vitamin D Deficiency Natalie Petersen has a diagnosis of vitamin D deficiency. She is currently taking prescription Vit D and denies nausea, vomiting or muscle weakness.  At risk for osteopenia and osteoporosis Natalie Petersen is at higher risk of osteopenia and osteoporosis due to vitamin D deficiency.   Hypertension Glendene D Petersen is a 42 y.o. female with hypertension. Natalie Petersen's blood pressure is stable. She denies chest pain or shortness of breath. She is on carvedilol, amlodipine, and hydrochlorothiazide 25 mg. She complains of headache after starting hydrochlorothiazide. She is encouraged to talk to her primary care physician at scheduled visit tomorrow about any potential management of her hypertension medications. She is working weight loss to help control her blood pressure with the goal of decreasing her risk of heart attack and stroke. Natalie Petersen's blood pressure is currently controlled.  ALLERGIES: Allergies  Allergen Reactions  . Ace Inhibitors Swelling  . Lisinopril Swelling  . Amoxicillin Hives and Swelling  . Benadryl [Diphenhydramine Hcl (Sleep)] Hives and Swelling    MEDICATIONS: Current Outpatient Medications on File Prior to Visit  Medication Sig Dispense Refill  . amLODipine (NORVASC) 5 MG tablet Take 1 tablet (5 mg total) by mouth daily. 30 tablet 5  . buPROPion (WELLBUTRIN XL)  300 MG 24 hr tablet Take 1 tablet (300 mg total) by mouth daily. 90 tablet 3  . carvedilol (COREG) 3.125 MG tablet Take 1 tablet (3.125 mg total) by mouth 2 (two) times daily with a meal. 60 tablet 3  . EPINEPHrine 0.3 mg/0.3 mL IJ SOAJ injection Inject 0.3 mg into the muscle once.     . hydrochlorothiazide (HYDRODIURIL) 25 MG tablet Take 1 tablet (25 mg total) by mouth daily. 30 tablet 0  . sertraline (ZOLOFT) 50 MG tablet TAKE 1 TABLET(50 MG) BY MOUTH DAILY 90 tablet 1  . norethindrone-ethinyl estradiol 1/35 ([place] 1/35) tablet Take 1 tablet by mouth daily. 3 Package 3   No current facility-administered medications on file prior to visit.     PAST MEDICAL HISTORY: Past Medical History:  Diagnosis Date  . Allergy    seasonal  . Anxiety   . Depression   . Menopausal symptoms   . Metrorrhagia   . Vitamin D deficiency     PAST SURGICAL HISTORY: Past Surgical History:  Procedure Laterality Date  . BRAIN SURGERY      SOCIAL HISTORY: Social History   Tobacco Use  . Smoking status: Never Smoker  . Smokeless tobacco: Never Used  Substance Use Topics  . Alcohol use: Yes    Alcohol/week: 1.2 oz    Types: 2 Glasses of wine per week    Frequency: Never  . Drug use: No    FAMILY HISTORY: Family History  Problem Relation Age of Onset  . Heart murmur Mother   . Atrial  fibrillation Mother   . Arthritis Father        RA  . Hyperlipidemia Father   . Cancer Maternal Aunt        ovarian and uterine   . Arthritis Maternal Grandmother   . Heart attack Maternal Grandfather   . Stroke Paternal Grandmother   . Arthritis Paternal Grandmother   . Breast cancer Paternal Grandmother   . Stroke Paternal Uncle     ROS: Review of Systems  Constitutional: Positive for weight loss.  Respiratory: Negative for shortness of breath.   Cardiovascular: Negative for chest pain.  Gastrointestinal: Negative for nausea and vomiting.  Musculoskeletal:       Negative muscle weakness    Neurological: Positive for headaches.    PHYSICAL EXAM: Blood pressure 136/87, pulse 87, temperature 98.4 F (36.9 C), temperature source Oral, height 5' 9"  (1.753 m), weight 281 lb (127.5 kg), last menstrual period 03/15/2018, SpO2 98 %. Body mass index is 41.5 kg/m. Physical Exam  Constitutional: She is oriented to person, place, and time. She appears well-developed and well-nourished.  Cardiovascular: Normal rate.  Pulmonary/Chest: Effort normal.  Musculoskeletal: Normal range of motion.  Neurological: She is oriented to person, place, and time.  Skin: Skin is warm and dry.  Psychiatric: She has a normal mood and affect. Her behavior is normal.  Vitals reviewed.   RECENT LABS AND TESTS: BMET    Component Value Date/Time   NA 139 03/15/2018 0311   NA 138 02/11/2018 1148   K 3.8 03/15/2018 0311   CL 106 03/15/2018 0311   CO2 25 03/15/2018 0311   GLUCOSE 152 (H) 03/15/2018 0311   BUN 11 03/15/2018 0311   BUN 9 02/11/2018 1148   CREATININE 0.68 03/15/2018 0311   CREATININE 0.83 10/13/2017 0940   CALCIUM 8.8 (L) 03/15/2018 0311   GFRNONAA >60 03/15/2018 0311   GFRNONAA 87 10/13/2017 0940   GFRAA >60 03/15/2018 0311   GFRAA 101 10/13/2017 0940   Lab Results  Component Value Date   HGBA1C 5.5 02/11/2018   HGBA1C 5.8 01/23/2014   Lab Results  Component Value Date   INSULIN 16.9 02/11/2018   CBC    Component Value Date/Time   WBC 17.4 (H) 03/15/2018 0311   RBC 4.72 03/15/2018 0311   HGB 12.8 03/15/2018 0311   HGB 13.7 02/11/2018 1148   HCT 39.6 03/15/2018 0311   HCT 40.2 02/11/2018 1148   PLT 354 03/15/2018 0311   MCV 84.0 03/15/2018 0311   MCV 81 02/11/2018 1148   MCH 27.2 03/15/2018 0311   MCHC 32.4 03/15/2018 0311   RDW 14.5 03/15/2018 0311   RDW 14.5 02/11/2018 1148   LYMPHSABS 1.0 03/14/2018 0857   LYMPHSABS 2.6 02/11/2018 1148   MONOABS 0.3 03/14/2018 0857   EOSABS 0.0 03/14/2018 0857   EOSABS 0.1 02/11/2018 1148   BASOSABS 0.1 03/14/2018 0857    BASOSABS 0.0 02/11/2018 1148   Iron/TIBC/Ferritin/ %Sat No results found for: IRON, TIBC, FERRITIN, IRONPCTSAT Lipid Panel     Component Value Date/Time   CHOL 199 02/11/2018 1148   TRIG 121 02/11/2018 1148   HDL 71 02/11/2018 1148   CHOLHDL 2.7 10/13/2017 0940   VLDL 20 09/09/2016 0941   LDLCALC 104 (H) 02/11/2018 1148   LDLCALC 127 (H) 10/13/2017 0940   Hepatic Function Panel     Component Value Date/Time   PROT 6.9 02/11/2018 1148   ALBUMIN 4.1 02/11/2018 1148   AST 15 02/11/2018 1148   ALT 13 02/11/2018 1148  ALKPHOS 92 02/11/2018 1148   BILITOT 0.3 02/11/2018 1148      Component Value Date/Time   TSH 3.640 02/11/2018 1148   TSH 4.80 (H) 10/13/2017 0940   TSH 4.49 09/09/2016 0941  Results for MEGAN, PRESTI (MRN [id number]) as of 04/07/2018 15:41  Ref. Range 02/11/2018 11:48  Vitamin D, 25-Hydroxy Latest Ref Range: 30.0 - 100.0 ng/mL 13.5 (L)    ASSESSMENT AND PLAN: Vitamin D deficiency - Plan: Vitamin D, Ergocalciferol, (DRISDOL) 50000 units CAPS capsule  Essential hypertension  At risk for osteoporosis  Class 3 severe obesity with serious comorbidity and body mass index (BMI) of 40.0 to 44.9 in adult, unspecified obesity type (HCC)  PLAN:  Vitamin D Deficiency Christa was informed that low vitamin D levels contributes to fatigue and are associated with obesity, breast, and colon cancer. Lavanya agrees to continue taking prescription Vit D @50 ,000 IU every week #4 and we will refill for 1 month. She will follow up for routine testing of vitamin D, at least 2-3 times per year. She was informed of the risk of over-replacement of vitamin D and agrees to not increase her dose unless she discusses this with [place] first. Rhea agrees to follow up with our clinic in 3 weeks.  At risk for osteopenia and osteoporosis Christen is at risk for osteopenia and osteoporsis due to her vitamin D deficiency. She was encouraged to take her vitamin D and follow her higher calcium diet and  increase strengthening exercise to help strengthen her bones and decrease her risk of osteopenia and osteoporosis.  Hypertension We discussed sodium restriction, working on healthy weight loss, and a regular exercise program as the means to achieve improved blood pressure control. Marlissa agreed with this plan and agreed to follow up as directed. We will continue to monitor her blood pressure as well as her progress with the above lifestyle modifications. She will continue her medications as prescribed, follow up with her primary care physician as scheduled, and she will watch for signs of hypotension as she continues her lifestyle modifications. Debar agrees to follow up with our clinic in 3 weeks.  Obesity Dajon is currently in the action stage of change. As such, her goal is to continue with weight loss efforts She has agreed to follow the Category 3 plan Lisbet has been instructed to work up to a goal of 150 minutes of combined cardio and strengthening exercise per week for weight loss and overall health benefits. We discussed the following Behavioral Modification Strategies today: increasing lean protein intake and work on meal planning and easy cooking plans   Komal has agreed to follow up with our clinic in 3 weeks. She was informed of the importance of frequent follow up visits to maximize her success with intensive lifestyle modifications for her multiple health conditions.   OBESITY BEHAVIORAL INTERVENTION VISIT  Today's visit was # 5 out of 22.  Starting weight: 301 lbs Starting date: 02/11/18 Today's weight : 281 lbs  Today's date: 04/07/2018 Total lbs lost to date: 20 (Patients must lose 7 lbs in the first 6 months to continue with counseling)   ASK: We discussed the diagnosis of obesity with Dimple Casey today and Ilyse agreed to give [place] permission to discuss obesity behavioral modification therapy today.  ASSESS: Lorenza has the diagnosis of obesity and her BMI today is  41.48 Makaley is in the action stage of change   ADVISE: Nicollette was educated on the multiple health risks of obesity as well  as the benefit of weight loss to improve her health. She was advised of the need for long term treatment and the importance of lifestyle modifications.  AGREE: Multiple dietary modification options and treatment options were discussed and  Kalanie agreed to the above obesity treatment plan.   Wilhemena Durie, am acting as transcriptionist for Lacy Duverney, PA-C I, Lacy Duverney [facility], have reviewed this note and agree with its content

## 2018-04-08 ENCOUNTER — Encounter: Payer: Self-pay | Admitting: Family Medicine

## 2018-04-08 ENCOUNTER — Ambulatory Visit (INDEPENDENT_AMBULATORY_CARE_PROVIDER_SITE_OTHER): Payer: 59 | Admitting: Family Medicine

## 2018-04-08 VITALS — BP 122/80 | HR 96 | Temp 98.6°F | Resp 14 | Ht 69.0 in | Wt 286.1 lb

## 2018-04-08 DIAGNOSIS — F3340 Major depressive disorder, recurrent, in remission, unspecified: Secondary | ICD-10-CM | POA: Diagnosis not present

## 2018-04-08 DIAGNOSIS — I10 Essential (primary) hypertension: Secondary | ICD-10-CM | POA: Diagnosis not present

## 2018-04-08 DIAGNOSIS — R000 Tachycardia, unspecified: Secondary | ICD-10-CM

## 2018-04-08 DIAGNOSIS — I517 Cardiomegaly: Secondary | ICD-10-CM | POA: Diagnosis not present

## 2018-04-08 DIAGNOSIS — R232 Flushing: Secondary | ICD-10-CM

## 2018-04-08 DIAGNOSIS — Z3041 Encounter for surveillance of contraceptive pills: Secondary | ICD-10-CM

## 2018-04-08 DIAGNOSIS — D72829 Elevated white blood cell count, unspecified: Secondary | ICD-10-CM | POA: Diagnosis not present

## 2018-04-08 DIAGNOSIS — R9431 Abnormal electrocardiogram [ECG] [EKG]: Secondary | ICD-10-CM | POA: Diagnosis not present

## 2018-04-08 DIAGNOSIS — E559 Vitamin D deficiency, unspecified: Secondary | ICD-10-CM | POA: Diagnosis not present

## 2018-04-08 MED ORDER — NORETHINDRONE 0.35 MG PO TABS: 1.0000 | ORAL_TABLET | Freq: Every day | ORAL | 11 refills | Status: DC

## 2018-04-08 NOTE — Assessment & Plan Note (Signed)
Working on this with bariatric clinic; patient avers that she is not on any sort of metabolism booster or diet pill or any other product through that clinic that could increase heart rate or BP

## 2018-04-08 NOTE — Patient Instructions (Addendum)
If you need something in the meantime as we work this up, you can increase your amlodipine to 10 mg daily Let's get labs Let's get the ultrasound of your kidneys and arteries to them and have you see a nephrologist Please call [phone] to schedule your imaging test Please wait 2-3 days after the order has been placed to call and get your test scheduled If you have not heard anything from my staff in a week about any orders/referrals/studies from today, please contact us here to follow-up [phone] DASH Eating Plan DASH stands for "Dietary Approaches to Stop Hypertension." The DASH eating plan is a healthy eating plan that has been shown to reduce high blood pressure (hypertension). It may also reduce your risk for type 2 diabetes, heart disease, and stroke. The DASH eating plan may also help with weight loss. What are tips for following this plan? General guidelines  Avoid eating more than 2,300 mg (milligrams) of salt (sodium) a day. If you have hypertension, you may need to reduce your sodium intake to 1,500 mg a day.  Limit alcohol intake to no more than 1 drink a day for nonpregnant women and 2 drinks a day for men. One drink equals 12 oz of beer, 5 oz of wine, or 1 oz of hard liquor.  Work with your health care provider to maintain a healthy body weight or to lose weight. Ask what an ideal weight is for you.  Get at least 30 minutes of exercise that causes your heart to beat faster (aerobic exercise) most days of the week. Activities may include walking, swimming, or biking.  Work with your health care provider or diet and nutrition specialist (dietitian) to adjust your eating plan to your individual calorie needs. Reading food labels  Check food labels for the amount of sodium per serving. Choose foods with less than 5 percent of the Daily Value of sodium. Generally, foods with less than 300 mg of sodium per serving fit into this eating plan.  To find whole grains, look for  the word "whole" as the first word in the ingredient list. Shopping  Buy products labeled as "low-sodium" or "no salt added."  Buy fresh foods. Avoid canned foods and premade or frozen meals. Cooking  Avoid adding salt when cooking. Use salt-free seasonings or herbs instead of table salt or sea salt. Check with your health care provider or pharmacist before using salt substitutes.  Do not fry foods. Cook foods using healthy methods such as baking, boiling, grilling, and broiling instead.  Cook with heart-healthy oils, such as olive, canola, soybean, or sunflower oil. Meal planning   Eat a balanced diet that includes: ? 5 or more servings of fruits and vegetables each day. At each meal, try to fill half of your plate with fruits and vegetables. ? Up to 6-8 servings of whole grains each day. ? Less than 6 oz of lean meat, poultry, or fish each day. A 3-oz serving of meat is about the same size as a deck of cards. One egg equals 1 oz. ? 2 servings of low-fat dairy each day. ? A serving of nuts, seeds, or beans 5 times each week. ? Heart-healthy fats. Healthy fats called Omega-3 fatty acids are found in foods such as flaxseeds and coldwater fish, like sardines, salmon, and mackerel.  Limit how much you eat of the following: ? Canned or prepackaged foods. ? Food that is high in trans fat, such as fried foods. ? Food that is  high in saturated fat, such as fatty meat. ? Sweets, desserts, sugary drinks, and other foods with added sugar. ? Full-fat dairy products.  Do not salt foods before eating.  Try to eat at least 2 vegetarian meals each week.  Eat more home-cooked food and less restaurant, buffet, and fast food.  When eating at a restaurant, ask that your food be prepared with less salt or no salt, if possible. What foods are recommended? The items listed may not be a complete list. Talk with your dietitian about what dietary choices are best for you. Grains Whole-grain or  whole-wheat bread. Whole-grain or whole-wheat pasta. Brown rice. Modena Morrow. Bulgur. Whole-grain and low-sodium cereals. Pita bread. Low-fat, low-sodium crackers. Whole-wheat flour tortillas. Vegetables Fresh or frozen vegetables (raw, steamed, roasted, or grilled). Low-sodium or reduced-sodium tomato and vegetable juice. Low-sodium or reduced-sodium tomato sauce and tomato paste. Low-sodium or reduced-sodium canned vegetables. Fruits All fresh, dried, or frozen fruit. Canned fruit in natural juice (without added sugar). Meat and other protein foods Skinless chicken or [place]. Ground chicken or [place]. Pork with fat trimmed off. Fish and seafood. Egg whites. Dried beans, peas, or lentils. Unsalted nuts, nut butters, and seeds. Unsalted canned beans. Lean cuts of beef with fat trimmed off. Low-sodium, lean deli meat. Dairy Low-fat (1%) or fat-free (skim) milk. Fat-free, low-fat, or reduced-fat cheeses. Nonfat, low-sodium ricotta or cottage cheese. Low-fat or nonfat yogurt. Low-fat, low-sodium cheese. Fats and oils Soft margarine without trans fats. Vegetable oil. Low-fat, reduced-fat, or light mayonnaise and salad dressings (reduced-sodium). Canola, safflower, olive, soybean, and sunflower oils. Avocado. Seasoning and other foods Herbs. Spices. Seasoning mixes without salt. Unsalted popcorn and pretzels. Fat-free sweets. What foods are not recommended? The items listed may not be a complete list. Talk with your dietitian about what dietary choices are best for you. Grains Baked goods made with fat, such as croissants, muffins, or some breads. Dry pasta or rice meal packs. Vegetables Creamed or fried vegetables. Vegetables in a cheese sauce. Regular canned vegetables (not low-sodium or reduced-sodium). Regular canned tomato sauce and paste (not low-sodium or reduced-sodium). Regular tomato and vegetable juice (not low-sodium or reduced-sodium). Angie Fava. Olives. Fruits Canned fruit in a light  or heavy syrup. Fried fruit. Fruit in cream or butter sauce. Meat and other protein foods Fatty cuts of meat. Ribs. Fried meat. Berniece Salines. Sausage. Bologna and other processed lunch meats. Salami. Fatback. Hotdogs. Bratwurst. Salted nuts and seeds. Canned beans with added salt. Canned or smoked fish. Whole eggs or egg yolks. Chicken or [place] with skin. Dairy Whole or 2% milk, cream, and half-and-half. Whole or full-fat cream cheese. Whole-fat or sweetened yogurt. Full-fat cheese. Nondairy creamers. Whipped toppings. Processed cheese and cheese spreads. Fats and oils Butter. Stick margarine. Lard. Shortening. Ghee. Bacon fat. Tropical oils, such as coconut, palm kernel, or palm oil. Seasoning and other foods Salted popcorn and pretzels. Onion salt, garlic salt, seasoned salt, table salt, and sea salt. Worcestershire sauce. Tartar sauce. Barbecue sauce. Teriyaki sauce. Soy sauce, including reduced-sodium. Steak sauce. Canned and packaged gravies. Fish sauce. Oyster sauce. Cocktail sauce. Horseradish that you find on the shelf. Ketchup. Mustard. Meat flavorings and tenderizers. Bouillon cubes. Hot sauce and Tabasco sauce. Premade or packaged marinades. Premade or packaged taco seasonings. Relishes. Regular salad dressings. Where to find more information:  National Heart, Lung, and [place]: https://wilson-eaton.com/  American Heart Association: www.heart.org Summary  The DASH eating plan is a healthy eating plan that has been shown to reduce high blood pressure (hypertension). It  may also reduce your risk for type 2 diabetes, heart disease, and stroke.  With the DASH eating plan, you should limit salt (sodium) intake to 2,300 mg a day. If you have hypertension, you may need to reduce your sodium intake to 1,500 mg a day.  When on the DASH eating plan, aim to eat more fresh fruits and vegetables, whole grains, lean proteins, low-fat dairy, and heart-healthy fats.  Work with your health care provider or  diet and nutrition specialist (dietitian) to adjust your eating plan to your individual calorie needs. This information is not intended to replace advice given to you by your health care provider. Make sure you discuss any questions you have with your health care provider. Document Released: 11/27/2011 Document Revised: 12/01/2016 Document Reviewed: 12/01/2016 Elsevier Interactive Patient Education  [employer].

## 2018-04-08 NOTE — Assessment & Plan Note (Signed)
Detailed message left for patient; do not take combo OCP; use condoms only or call me back for other OCP

## 2018-04-08 NOTE — Assessment & Plan Note (Signed)
Reviewed EKG from 02/11/18; under magnification, lead II almost looked like a delta wave; sending to cardiologist at [facility] and asking them to review; she will see cardiologist on May 1st; has already had echocardiogram done, reviewed together today

## 2018-04-08 NOTE — Assessment & Plan Note (Addendum)
Not entirely making sense; no diet pills or stimulants; will start work-up for secondary causes of hypertension; refer to nephrologist; checking labs today along with urine; after patient left, realized she has combo OCP in med list, though it is expired; I called, recommended NOT taking combination oral contraceptives if BP not controlled; will be glad to switch to safer version or she can use condoms; detailed message left, explained can increase risk of heart attack or stroke; call me back if prog only pill desired

## 2018-04-08 NOTE — Addendum Note (Signed)
Addended by: LADA, Satira Anis on: 04/08/2018 04:09 PM   Modules accepted: Orders

## 2018-04-08 NOTE — Assessment & Plan Note (Signed)
Concentric LVH; most likely from her morbid obesity, as her HTN is very recent

## 2018-04-08 NOTE — Assessment & Plan Note (Signed)
Managed by psych

## 2018-04-08 NOTE — Assessment & Plan Note (Signed)
On 50k weekly per other clinic

## 2018-04-08 NOTE — Progress Notes (Addendum)
BP 122/80   Pulse 96   Temp 98.6 F (37 C) (Oral)   Resp 14   Ht 5' 9"  (1.753 m)   Wt 286 lb 1.6 oz (129.8 kg)   LMP 03/15/2018 (Approximate)   SpO2 94%   BMI 42.25 kg/m    Subjective:    Patient ID: Natalie Petersen, female    DOB: Aug 10, 1976, 42 y.o.   MRN: [id number]  HPI: Natalie Petersen is a 42 y.o. female  Chief Complaint  Patient presents with  . Follow-up    Amlodipine pt has been taking for 2 days, carvedilol has been taking for one week, also is on HCTZ. Pt states BP still running high at home and has been having headache and lightheadedness.   . Hypertension    HPI Patient is here for high blood pressure She is eating lean protein and vegetables and fruits; walking too about 3-4 times a week, about 15-20 minutes Lingering headache and headache She did not have blood pressure issues until the weight loss clinic; she would get palpitations and BP started to go; no medicines and no detox and no metabolism booster She has lost 20 pounds since starting on  Mother has atrial fibrillation, but controlled BP; maybe aunts or uncles No fam hx of pheo, MEN-2, no stents to the kidneys No decongestants No black licorice or black jelly beans Reduced caffeine intake No hair growth where it shouldn't be No abnormal sweating tsh and free t4 were fine EKG from 02/11/18 reviewed, sending to Dr. Sabra Heck at [facility]; has appt with her May 1st Had echocardiogram; reviewed results together today Low B12, 269 in Feb 2019; has B12 at home but not taking Vitamin D was only 13.5, now on weekly vit D 50k Hx of prediabetes; 5.8 4 years ago, now 5.5  Depression screen [facility] 2/9 04/08/2018 02/11/2018 10/13/2017 09/09/2016 07/16/2015  Decreased Interest 0 3 0 0 0  Down, Depressed, Hopeless 0 2 0 0 0  PHQ - 2 Score 0 5 0 0 0  Altered sleeping - 1 - - -  Tired, decreased energy - 3 - - -  Change in appetite - 2 - - -  Feeling bad or failure about yourself  - 1 - - -  Trouble concentrating - 0 - - -    Moving slowly or fidgety/restless - 0 - - -  Suicidal thoughts - 0 - - -  PHQ-9 Score - 12 - - -  Difficult doing work/chores - Not difficult at all - - -    Relevant past medical, surgical, family and social history reviewed Past Medical History:  Diagnosis Date  . Allergy    seasonal  . Anxiety   . Depression   . Menopausal symptoms   . Metrorrhagia   . Vitamin D deficiency    Past Surgical History:  Procedure Laterality Date  . BRAIN SURGERY     Family History  Problem Relation Age of Onset  . Heart murmur Mother   . Atrial fibrillation Mother   . Arthritis Father        RA  . Hyperlipidemia Father   . Cancer Maternal Aunt        ovarian and uterine   . Arthritis Maternal Grandmother   . Heart attack Maternal Grandfather   . Stroke Paternal Grandmother   . Arthritis Paternal Grandmother   . Breast cancer Paternal Grandmother   . Stroke Paternal Uncle    Social History   Tobacco Use  .  Smoking status: Never Smoker  . Smokeless tobacco: Never Used  Substance Use Topics  . Alcohol use: Yes    Alcohol/week: 1.2 oz    Types: 2 Glasses of wine per week    Frequency: Never  . Drug use: No    Interim medical history since last visit reviewed. Allergies and medications reviewed  Review of Systems Per HPI unless specifically indicated above     Objective:    BP 122/80   Pulse 96   Temp 98.6 F (37 C) (Oral)   Resp 14   Ht 5' 9"  (1.753 m)   Wt 286 lb 1.6 oz (129.8 kg)   LMP 03/15/2018 (Approximate)   SpO2 94%   BMI 42.25 kg/m   Wt Readings from Last 3 Encounters:  04/08/18 286 lb 1.6 oz (129.8 kg)  04/07/18 281 lb (127.5 kg)  03/30/18 288 lb (130.6 kg)    Physical Exam  Constitutional: She appears well-developed and well-nourished. No distress.  HENT:  Head: Normocephalic and atraumatic.  Eyes: EOM are normal. No scleral icterus.  Neck: No thyromegaly present.  Cardiovascular: Normal rate, regular rhythm and normal heart sounds.  No murmur  heard. Pulmonary/Chest: Effort normal and breath sounds normal. No respiratory distress. She has no wheezes.  Abdominal: Soft. Bowel sounds are normal. She exhibits no distension and no abdominal bruit. No hernia.  Musculoskeletal: Normal range of motion. She exhibits no edema.  Neurological: She is alert. She exhibits normal muscle tone.  Skin: Skin is warm and dry. She is not diaphoretic. No pallor.  Face does appear somewhat flushed, no diaphoresis  Psychiatric: She has a normal mood and affect. Her behavior is normal. Judgment and thought content normal. Her mood appears not anxious. She does not exhibit a depressed mood.    Results for orders placed or performed during the hospital encounter of 71/69/67  Basic metabolic panel  Result Value Ref Range   Sodium 135 135 - 145 mmol/L   Potassium 3.8 3.5 - 5.1 mmol/L   Chloride 103 101 - 111 mmol/L   CO2 21 (L) 22 - 32 mmol/L   Glucose, Bld 170 (H) 65 - 99 mg/dL   BUN 10 6 - 20 mg/dL   Creatinine, Ser 0.75 0.44 - 1.00 mg/dL   Calcium 8.7 (L) 8.9 - 10.3 mg/dL   GFR calc non Af Amer >60 >60 mL/min   GFR calc Af Amer >60 >60 mL/min   Anion gap 11 5 - 15  CBC with Differential  Result Value Ref Range   WBC 15.3 (H) 3.6 - 11.0 K/uL   RBC 4.85 3.80 - 5.20 MIL/uL   Hemoglobin 13.4 12.0 - 16.0 g/dL   HCT 40.6 35.0 - 47.0 %   MCV 83.8 80.0 - 100.0 fL   MCH 27.7 26.0 - 34.0 pg   MCHC 33.1 32.0 - 36.0 g/dL   RDW 14.2 11.5 - 14.5 %   Platelets 394 150 - 440 K/uL   Neutrophils Relative % 91 %   Neutro Abs 13.9 (H) 1.4 - 6.5 K/uL   Lymphocytes Relative 7 %   Lymphs Abs 1.0 1.0 - 3.6 K/uL   Monocytes Relative 2 %   Monocytes Absolute 0.3 0.2 - 0.9 K/uL   Eosinophils Relative 0 %   Eosinophils Absolute 0.0 0 - 0.7 K/uL   Basophils Relative 0 %   Basophils Absolute 0.1 0 - 0.1 K/uL  HIV antibody (Routine Testing)  Result Value Ref Range   HIV Screen 4th Generation wRfx  Non Reactive Non Reactive  Basic metabolic panel  Result Value Ref  Range   Sodium 139 135 - 145 mmol/L   Potassium 3.8 3.5 - 5.1 mmol/L   Chloride 106 101 - 111 mmol/L   CO2 25 22 - 32 mmol/L   Glucose, Bld 152 (H) 65 - 99 mg/dL   BUN 11 6 - 20 mg/dL   Creatinine, Ser 0.68 0.44 - 1.00 mg/dL   Calcium 8.8 (L) 8.9 - 10.3 mg/dL   GFR calc non Af Amer >60 >60 mL/min   GFR calc Af Amer >60 >60 mL/min   Anion gap 8 5 - 15  CBC  Result Value Ref Range   WBC 17.4 (H) 3.6 - 11.0 K/uL   RBC 4.72 3.80 - 5.20 MIL/uL   Hemoglobin 12.8 12.0 - 16.0 g/dL   HCT 39.6 35.0 - 47.0 %   MCV 84.0 80.0 - 100.0 fL   MCH 27.2 26.0 - 34.0 pg   MCHC 32.4 32.0 - 36.0 g/dL   RDW 14.5 11.5 - 14.5 %   Platelets 354 150 - 440 K/uL  Glucose, capillary  Result Value Ref Range   Glucose-Capillary 93 65 - 99 mg/dL   Comment 1 Notify RN       Assessment & Plan:   Problem List Items Addressed This Visit      Cardiovascular and Mediastinum   LVH (left ventricular hypertrophy) (Chronic)    Concentric LVH; most likely from her morbid obesity, as her HTN is very recent      Essential hypertension, benign - Primary (Chronic)    Not entirely making sense; no diet pills or stimulants; will start work-up for secondary causes of hypertension; refer to nephrologist; checking labs today along with urine; after patient left, realized she has combo OCP in med list, though it is expired; I called, recommended NOT taking combination oral contraceptives if BP not controlled; will be glad to switch to safer version or she can use condoms; detailed message left, explained can increase risk of heart attack or stroke; call me back if prog only pill desired      Relevant Orders   CBC with Differential/Platelet   Urine Microalbumin w/creat. ratio   Aldosterone + renin activity w/ ratio   Ambulatory referral to Nephrology   US Renal Artery Stenosis   Magnesium   Catecholamine+VMA, 24-Hr Urine   Metanephrines, Urine, 24 hour   Basic Metabolic Panel (BMET)     Other   Vitamin D deficiency     On 50k weekly per other clinic      Oral contraceptive pill surveillance    Detailed message left for patient; do not take combo OCP; use condoms only or call me back for other OCP      Obesity, Class III, BMI 40-49.9 (morbid obesity) ([place]) (Chronic)    Working on this with bariatric clinic; patient avers that she is not on any sort of metabolism booster or diet pill or any other product through that clinic that could increase heart rate or BP      Depression, major, recurrent, in remission ([place])    Managed by psych      Abnormal EKG    Reviewed EKG from 02/11/18; under magnification, lead II almost looked like a delta wave; sending to cardiologist at [facility] and asking them to review; she will see cardiologist on May 1st; has already had echocardiogram done, reviewed together today       Other Visit Diagnoses  Facial flushing       Relevant Orders   Catecholamine+VMA, 24-Hr Urine   Metanephrines, Urine, 24 hour   Basic Metabolic Panel (BMET)   Tachycardia       Relevant Orders   Catecholamine+VMA, 24-Hr Urine   Metanephrines, Urine, 24 hour   Basic Metabolic Panel (BMET)   Leukocytosis, unspecified type       most likely related to recent steroids, but will recheck to make sure resolved       Follow up plan: Return in about 2 weeks (around 04/22/2018) for follow-up visit with Dr. Sanda Klein.  An after-visit summary was printed and given to the patient at [facility].  Please see the patient instructions which may contain other information and recommendations beyond what is mentioned above in the assessment and plan.  No orders of the defined types were placed in this encounter.   Orders Placed This Encounter  Procedures  . US Renal Artery Stenosis  . CBC with Differential/Platelet  . Urine Microalbumin w/creat. ratio  . Aldosterone + renin activity w/ ratio  . Magnesium  . Catecholamine+VMA, 24-Hr Urine  . Metanephrines, Urine, 24 hour  . Basic Metabolic Panel (BMET)  .  Ambulatory referral to Nephrology    ------------------------------------ Addendum: I spoke with the patient about her combo OCP; she will stop that today and start Micronor

## 2018-04-15 ENCOUNTER — Telehealth: Payer: Self-pay | Admitting: Family Medicine

## 2018-04-15 NOTE — Telephone Encounter (Signed)
Copied from [place] [id number]. Topic: Quick Communication - Rx Refill/Question >> Apr 15, 2018  3:44 PM Cleaster Corin, [place] wrote: Medication: hydrochlorothiazide (HYDRODIURIL) 25 MG tablet [[id number]]  Has the patient contacted their pharmacy? yes (Agent: If no, request that the patient contact the pharmacy for the refill.) Preferred Pharmacy (with phone number or street name):[place] [address] [place] [address] [place] [address] Phone: [phone] Fax: [phone]   Agent: Please be advised that RX refills may take up to 3 business days. We ask that you follow-up with your pharmacy.

## 2018-04-16 ENCOUNTER — Encounter (INDEPENDENT_AMBULATORY_CARE_PROVIDER_SITE_OTHER): Payer: Self-pay | Admitting: Physician Assistant

## 2018-04-16 MED ORDER — HYDROCHLOROTHIAZIDE 25 MG PO TABS
25.0000 mg | ORAL_TABLET | Freq: Every day | ORAL | 0 refills | Status: DC
Start: 2018-04-16 — End: 2018-04-27

## 2018-04-20 ENCOUNTER — Ambulatory Visit
Admission: RE | Admit: 2018-04-20 | Discharge: 2018-04-20 | Disposition: A | Discharge status: Home / Self Care | Payer: 59 | Source: Ambulatory Visit | Attending: Family Medicine | Admitting: Family Medicine

## 2018-04-20 DIAGNOSIS — R232 Flushing: Secondary | ICD-10-CM | POA: Diagnosis not present

## 2018-04-20 DIAGNOSIS — I10 Essential (primary) hypertension: Secondary | ICD-10-CM | POA: Diagnosis not present

## 2018-04-20 DIAGNOSIS — R000 Tachycardia, unspecified: Secondary | ICD-10-CM | POA: Diagnosis not present

## 2018-04-21 DIAGNOSIS — R002 Palpitations: Secondary | ICD-10-CM | POA: Diagnosis not present

## 2018-04-21 DIAGNOSIS — Z888 Allergy status to other drugs, medicaments and biological substances status: Secondary | ICD-10-CM | POA: Diagnosis not present

## 2018-04-21 DIAGNOSIS — I10 Essential (primary) hypertension: Secondary | ICD-10-CM | POA: Diagnosis not present

## 2018-04-21 LAB — MICROALBUMIN / CREATININE URINE RATIO
CREATININE, UR: 230.2 mg/dL
MICROALB/CREAT RATIO: 4.2 mg/g{creat} (ref 0.0–30.0)
MICROALBUM., U, RANDOM: 9.6 ug/mL

## 2018-04-21 LAB — CBC WITH DIFFERENTIAL/PLATELET
BASOS: 0 %
Basophils Absolute: 0 10*3/uL (ref 0.0–0.2)
EOS (ABSOLUTE): 0.1 10*3/uL (ref 0.0–0.4)
EOS: 1 %
Hematocrit: 43.5 % (ref 34.0–46.6)
Hemoglobin: 14.9 g/dL (ref 11.1–15.9)
IMMATURE GRANS (ABS): 0 10*3/uL (ref 0.0–0.1)
IMMATURE GRANULOCYTES: 0 %
LYMPHS: 23 %
Lymphocytes Absolute: 1.9 10*3/uL (ref 0.7–3.1)
MCH: 28.2 pg (ref 26.6–33.0)
MCHC: 34.3 g/dL (ref 31.5–35.7)
MCV: 82 fL (ref 79–97)
MONOCYTES: 6 %
Monocytes Absolute: 0.5 10*3/uL (ref 0.1–0.9)
NEUTROS ABS: 5.8 10*3/uL (ref 1.4–7.0)
NEUTROS PCT: 70 %
PLATELETS: 366 10*3/uL (ref 150–379)
RBC: 5.28 x10E6/uL (ref 3.77–5.28)
RDW: 14.2 % (ref 12.3–15.4)
WBC: 8.4 10*3/uL (ref 3.4–10.8)

## 2018-04-22 ENCOUNTER — Encounter: Payer: Self-pay | Admitting: Family Medicine

## 2018-04-22 LAB — MAGNESIUM: Magnesium: 1.7 mg/dL (ref 1.6–2.3)

## 2018-04-22 LAB — BASIC METABOLIC PANEL
BUN/Creatinine Ratio: 14 (ref 9–23)
BUN: 12 mg/dL (ref 6–24)
CALCIUM: 9.8 mg/dL (ref 8.7–10.2)
CHLORIDE: 99 mmol/L (ref 96–106)
CO2: 26 mmol/L (ref 20–29)
Creatinine, Ser: 0.85 mg/dL (ref 0.57–1.00)
GFR calc Af Amer: 97 mL/min/{1.73_m2} (ref >59)
GFR calc non Af Amer: 84 mL/min/{1.73_m2} (ref >59)
Glucose: 110 mg/dL — ABNORMAL HIGH (ref 65–99)
Potassium: 4.6 mmol/L (ref 3.5–5.2)
Sodium: 141 mmol/L (ref 134–144)

## 2018-04-22 LAB — ALDOSTERONE + RENIN ACTIVITY W/ RATIO
ALDOS/RENIN RATIO: 12.6 (ref 0.0–30.0)
ALDOSTERONE: 16.9 ng/dL (ref 0.0–30.0)
RENIN: 1.344 ng/mL/h (ref 0.167–5.380)

## 2018-04-26 ENCOUNTER — Other Ambulatory Visit: Payer: Self-pay | Admitting: Psychiatry

## 2018-04-26 ENCOUNTER — Encounter: Payer: Self-pay | Admitting: Psychiatry

## 2018-04-26 ENCOUNTER — Other Ambulatory Visit: Payer: Self-pay

## 2018-04-26 ENCOUNTER — Ambulatory Visit (INDEPENDENT_AMBULATORY_CARE_PROVIDER_SITE_OTHER): Payer: 59 | Admitting: Psychiatry

## 2018-04-26 VITALS — BP 148/96 | HR 106 | Temp 98.9°F | Ht 69.0 in | Wt 284.8 lb

## 2018-04-26 DIAGNOSIS — F411 Generalized anxiety disorder: Secondary | ICD-10-CM

## 2018-04-26 DIAGNOSIS — F330 Major depressive disorder, recurrent, mild: Secondary | ICD-10-CM

## 2018-04-26 MED ORDER — BUPROPION HCL ER (XL) 300 MG PO TB24: 300.0000 mg | ORAL_TABLET | Freq: Every day | ORAL | 1 refills | Status: DC

## 2018-04-26 MED ORDER — BUPROPION HCL ER (XL) 150 MG PO TB24: 150.0000 mg | ORAL_TABLET | Freq: Every day | ORAL | 1 refills | Status: DC
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
## 2018-04-26 NOTE — Patient Instructions (Signed)
Bupropion extended-release tablets (Depression/Mood Disorders) What is this medicine? BUPROPION (byoo PROE pee on) is used to treat depression. This medicine may be used for other purposes; ask your health care provider or pharmacist if you have questions. COMMON BRAND NAME(S): Aplenzin, Budeprion XL, Forfivo XL, Wellbutrin XL What should I tell my health care provider before I take this medicine? They need to know if you have any of these conditions: -an eating disorder, such as anorexia or bulimia -bipolar disorder or psychosis -diabetes or high blood sugar, treated with medication -glaucoma -head injury or brain tumor -heart disease, previous heart attack, or irregular heart beat -high blood pressure -kidney or liver disease -seizures (convulsions) -suicidal thoughts or a previous suicide attempt -Tourette's syndrome -weight loss -an unusual or allergic reaction to bupropion, other medicines, foods, dyes, or preservatives -breast-feeding -pregnant or trying to become pregnant How should I use this medicine? Take this medicine by mouth with a glass of water. Follow the directions on the prescription label. You can take it with or without food. If it upsets your stomach, take it with food. Do not crush, chew, or cut these tablets. This medicine is taken once daily at the same time each day. Do not take your medicine more often than directed. Do not stop taking this medicine suddenly except upon the advice of your doctor. Stopping this medicine too quickly may cause serious side effects or your condition may worsen. A special MedGuide will be given to you by the pharmacist with each prescription and refill. Be sure to read this information carefully each time. Talk to your pediatrician regarding the use of this medicine in children. Special care may be needed. Overdosage: If you think you have taken too much of this medicine contact a poison control center or emergency room at once. NOTE:  This medicine is only for you. Do not share this medicine with others. What if I miss a dose? If you miss a dose, skip the missed dose and take your next tablet at the regular time. Do not take double or extra doses. What may interact with this medicine? Do not take this medicine with any of the following medications: -linezolid -MAOIs like Azilect, Carbex, Eldepryl, Marplan, Nardil, and Parnate -methylene blue (injected into a vein) -other medicines that contain bupropion like Zyban This medicine may also interact with the following medications: -alcohol -certain medicines for anxiety or sleep -certain medicines for blood pressure like metoprolol, propranolol -certain medicines for depression or psychotic disturbances -certain medicines for HIV or AIDS like efavirenz, lopinavir, nelfinavir, ritonavir -certain medicines for irregular heart beat like propafenone, flecainide -certain medicines for Parkinson's disease like amantadine, levodopa -certain medicines for seizures like carbamazepine, phenytoin, phenobarbital -cimetidine -clopidogrel -cyclophosphamide -digoxin -furazolidone -isoniazid -nicotine -orphenadrine -procarbazine -steroid medicines like prednisone or cortisone -stimulant medicines for attention disorders, weight loss, or to stay awake -tamoxifen -theophylline -thiotepa -ticlopidine -tramadol -warfarin This list may not describe all possible interactions. Give your health care provider a list of all the medicines, herbs, non-prescription drugs, or dietary supplements you use. Also tell them if you smoke, drink alcohol, or use illegal drugs. Some items may interact with your medicine. What should I watch for while using this medicine? Tell your doctor if your symptoms do not get better or if they get worse. Visit your doctor or health care professional for regular checks on your progress. Because it may take several weeks to see the full effects of this medicine, it  is important to continue your treatment as  prescribed by your doctor. Patients and their families should watch out for new or worsening thoughts of suicide or depression. Also watch out for sudden changes in feelings such as feeling anxious, agitated, panicky, irritable, hostile, aggressive, impulsive, severely restless, overly excited and hyperactive, or not being able to sleep. If this happens, especially at the beginning of treatment or after a change in dose, call your health care professional. Avoid alcoholic drinks while taking this medicine. Drinking large amounts of alcoholic beverages, using sleeping or anxiety medicines, or quickly stopping the use of these agents while taking this medicine may increase your risk for a seizure. Do not drive or use heavy machinery until you know how this medicine affects you. This medicine can impair your ability to perform these tasks. Do not take this medicine close to bedtime. It may prevent you from sleeping. Your mouth may get dry. Chewing sugarless gum or sucking hard candy, and drinking plenty of water may help. Contact your doctor if the problem does not go away or is severe. The tablet shell for some brands of this medicine does not dissolve. This is normal. The tablet shell may appear whole in the stool. This is not a cause for concern. What side effects may I notice from receiving this medicine? Side effects that you should report to your doctor or health care professional as soon as possible: -allergic reactions like skin rash, itching or hives, swelling of the face, lips, or tongue -breathing problems -changes in vision -confusion -elevated mood, decreased need for sleep, racing thoughts, impulsive behavior -fast or irregular heartbeat -hallucinations, loss of contact with reality -increased blood pressure -redness, blistering, peeling or loosening of the skin, including inside the mouth -seizures -suicidal thoughts or other mood  changes -unusually weak or tired -vomiting Side effects that usually do not require medical attention (report to your doctor or health care professional if they continue or are bothersome): -constipation -headache -loss of appetite -nausea -tremors -weight loss This list may not describe all possible side effects. Call your doctor for medical advice about side effects. You may report side effects to FDA at 1-800-FDA-1088. Where should I keep my medicine? Keep out of the reach of children. Store at room temperature between 15 and 30 degrees C (59 and 86 degrees F). Throw away any unused medicine after the expiration date. NOTE: This sheet is a summary. It may not cover all possible information. If you have questions about this medicine, talk to your doctor, pharmacist, or health care provider.  2018 Elsevier/Gold Standard (2016-05-30 13:55:13)

## 2018-04-27 ENCOUNTER — Encounter: Payer: Self-pay | Admitting: Family Medicine

## 2018-04-27 ENCOUNTER — Ambulatory Visit (INDEPENDENT_AMBULATORY_CARE_PROVIDER_SITE_OTHER): Payer: 59 | Admitting: Family Medicine

## 2018-04-27 DIAGNOSIS — I517 Cardiomegaly: Secondary | ICD-10-CM

## 2018-04-27 DIAGNOSIS — I10 Essential (primary) hypertension: Secondary | ICD-10-CM

## 2018-04-27 MED ORDER — AMLODIPINE BESYLATE 5 MG PO TABS: 5.0000 mg | ORAL_TABLET | Freq: Every day | ORAL | 3 refills | Status: DC

## 2018-04-27 MED ORDER — HYDROCHLOROTHIAZIDE 25 MG PO TABS: 25.0000 mg | ORAL_TABLET | Freq: Every day | ORAL | 3 refills | Status: DC

## 2018-04-27 MED ORDER — CARVEDILOL 6.25 MG PO TABS: 6.2500 mg | ORAL_TABLET | Freq: Two times a day (BID) | ORAL | 3 refills | Status: DC

## 2018-04-27 NOTE — Assessment & Plan Note (Signed)
Patient is working on weight loss

## 2018-04-27 NOTE — Assessment & Plan Note (Signed)
With fluctuating BP; cardiologist advised increasing the carvedilol; will increase to 6.25 mg BID; patient reports no problems with BID drug; she'll continue other meds; now that we're under better control, no renal problems on [place], okay to cancel appt with nephrologist; she'll monitor her BP and contact me if not to goal

## 2018-04-27 NOTE — Patient Instructions (Signed)
Check out the information at familydoctor.org entitled "Nutrition for Weight Loss: What You Need to Know about Fad Diets" Try to lose between 1-2 pounds per week by taking in fewer calories and burning off more calories You can succeed by limiting portions, limiting foods dense in calories and fat, becoming more active, and drinking 8 glasses of water a day (64 ounces) Don't skip meals, especially breakfast, as skipping meals may alter your metabolism Do not use over-the-counter weight loss pills or gimmicks that claim rapid weight loss A healthy BMI (or body mass index) is between 18.5 and 24.9 You can calculate your ideal BMI at the [place] website [employer]fr Try to follow the DASH guidelines (DASH stands for Dietary Approaches to Stop Hypertension). Try to limit the sodium in your diet to no more than 1,528m of sodium per day. Certainly try to not exceed 2,000 mg per day at the very most. Do not add salt when cooking or at the table.  Check the sodium amount on labels when shopping, and choose items lower in sodium when given a choice. Avoid or limit foods that already contain a lot of sodium. Eat a diet rich in fruits and vegetables and whole grains, and try to lose weight if overweight or obese Monitor your own BP and pulse and contact me if pulse less than 55 or top number of your BP is less than 110 and you feel weak or tired

## 2018-04-27 NOTE — Progress Notes (Signed)
BP 120/84   Pulse 81   Temp 98.5 F (36.9 C) (Oral)   Resp 14   Ht 5' 9"  (1.753 m)   Wt 282 lb 4.8 oz (128.1 kg)   LMP 04/11/2018   SpO2 96%   BMI 41.69 kg/m    Subjective:    Patient ID: Natalie Petersen, female    DOB: 1976-04-24, 42 y.o.   MRN: [id number]  HPI: Natalie Petersen is a 42 y.o. female  Chief Complaint  Patient presents with  . Follow-up    HPI Patient is here for f/u Saw cardiologist; no further testing planned Recommended increase carvedilol BP is still up and down; highest in the last week 167/97; lowest in the last week 124/78 Heart rate 81 today; no slow pulse She is on the 300 mg wellbutrin strength; psychiatrist adding another 150 mg so she'll be taking 450 mg total, but will pick that up She is not thinking the zoloft does much No excessive sweating; does get headaches when the blood pressure spikes; she told cardiologist and that's when she recommended increasing the beta-blocker; just there, but can still work Changed OPC Renal US reviewed Just one day of leg edema after lots of walking Sticking with her diet plan, even though her insurance won't pay for her to go any more Walking more, 15-20 minutes at a time She would like to swim again but worried about floating, HCTZ and urination Already had echo as well  Depression screen [facility] 2/9 04/08/2018 02/11/2018 10/13/2017 09/09/2016 07/16/2015  Decreased Interest 0 3 0 0 0  Down, Depressed, Hopeless 0 2 0 0 0  PHQ - 2 Score 0 5 0 0 0  Altered sleeping - 1 - - -  Tired, decreased energy - 3 - - -  Change in appetite - 2 - - -  Feeling bad or failure about yourself  - 1 - - -  Trouble concentrating - 0 - - -  Moving slowly or fidgety/restless - 0 - - -  Suicidal thoughts - 0 - - -  PHQ-9 Score - 12 - - -  Difficult doing work/chores - Not difficult at all - - -    Relevant past medical, surgical, family and social history reviewed Past Medical History:  Diagnosis Date  . Allergy    seasonal  .  Anxiety   . Depression   . Menopausal symptoms   . Metrorrhagia   . Vitamin D deficiency    Past Surgical History:  Procedure Laterality Date  . BRAIN SURGERY     Family History  Problem Relation Age of Onset  . Heart murmur Mother   . Atrial fibrillation Mother   . Arthritis Father        RA  . Hyperlipidemia Father   . Cancer Maternal Aunt        ovarian and uterine   . Arthritis Maternal Grandmother   . Heart attack Maternal Grandfather   . Stroke Paternal Grandmother   . Arthritis Paternal Grandmother   . Breast cancer Paternal Grandmother   . Stroke Paternal Uncle    Social History   Tobacco Use  . Smoking status: Never Smoker  . Smokeless tobacco: Never Used  Substance Use Topics  . Alcohol use: Yes    Alcohol/week: 1.2 oz    Types: 2 Glasses of wine per week    Frequency: Never  . Drug use: No    Interim medical history since last visit reviewed. Allergies and medications  reviewed  Review of Systems Per HPI unless specifically indicated above     Objective:    BP 120/84   Pulse 81   Temp 98.5 F (36.9 C) (Oral)   Resp 14   Ht 5' 9"  (1.753 m)   Wt 282 lb 4.8 oz (128.1 kg)   LMP 04/11/2018   SpO2 96%   BMI 41.69 kg/m   Wt Readings from Last 3 Encounters:  04/27/18 282 lb 4.8 oz (128.1 kg)  04/08/18 286 lb 1.6 oz (129.8 kg)  04/07/18 281 lb (127.5 kg)    Physical Exam  Constitutional: She appears well-developed and well-nourished.  Morbidly obese, but down 4 pounds since 04/08/18 note  HENT:  Mouth/Throat: Mucous membranes are normal.  Eyes: EOM are normal. No scleral icterus.  Cardiovascular: Normal rate and regular rhythm.  Pulmonary/Chest: Effort normal and breath sounds normal.  Psychiatric: She has a normal mood and affect. Her behavior is normal.    Results for orders placed or performed in visit on 04/08/18  CBC with Differential/Platelet  Result Value Ref Range   WBC 8.4 3.4 - 10.8 x10E3/uL   RBC 5.28 3.77 - 5.28 x10E6/uL    Hemoglobin 14.9 11.1 - 15.9 g/dL   Hematocrit 43.5 34.0 - 46.6 %   MCV 82 79 - 97 fL   MCH 28.2 26.6 - 33.0 pg   MCHC 34.3 31.5 - 35.7 g/dL   RDW 14.2 12.3 - 15.4 %   Platelets 366 150 - 379 x10E3/uL   Neutrophils 70 Not Estab. %   Lymphs 23 Not Estab. %   Monocytes 6 Not Estab. %   Eos 1 Not Estab. %   Basos 0 Not Estab. %   Neutrophils Absolute 5.8 1.4 - 7.0 x10E3/uL   Lymphocytes Absolute 1.9 0.7 - 3.1 x10E3/uL   Monocytes Absolute 0.5 0.1 - 0.9 x10E3/uL   EOS (ABSOLUTE) 0.1 0.0 - 0.4 x10E3/uL   Basophils Absolute 0.0 0.0 - 0.2 x10E3/uL   Immature Granulocytes 0 Not Estab. %   Immature Grans (Abs) 0.0 0.0 - 0.1 x10E3/uL  Urine Microalbumin w/creat. ratio  Result Value Ref Range   Creatinine, Urine 230.2 Not Estab. mg/dL   Microalbumin, Urine 9.6 Not Estab. ug/mL   Microalb/Creat Ratio 4.2 0.0 - 30.0 mg/g creat  Basic Metabolic Panel (BMET)  Result Value Ref Range   Glucose 110 (H) 65 - 99 mg/dL   BUN 12 6 - 24 mg/dL   Creatinine, Ser 0.85 0.57 - 1.00 mg/dL   GFR calc non Af Amer 84 >59 mL/min/1.73   GFR calc Af Amer 97 >59 mL/min/1.73   BUN/Creatinine Ratio 14 9 - 23   Sodium 141 134 - 144 mmol/L   Potassium 4.6 3.5 - 5.2 mmol/L   Chloride 99 96 - 106 mmol/L   CO2 26 20 - 29 mmol/L   Calcium 9.8 8.7 - 10.2 mg/dL  Aldosterone + renin activity w/ ratio  Result Value Ref Range   ALDOSTERONE 16.9 0.0 - 30.0 ng/dL   Renin 1.344 0.167 - 5.380 ng/mL/hr   ALDOS/RENIN RATIO 12.6 0.0 - 30.0  Magnesium  Result Value Ref Range   Magnesium 1.7 1.6 - 2.3 mg/dL      Assessment & Plan:   Problem List Items Addressed This Visit      Cardiovascular and Mediastinum   LVH (left ventricular hypertrophy) (Chronic)    Patient is working on weight loss      Relevant Medications   carvedilol (COREG) 6.25 MG tablet  hydrochlorothiazide (HYDRODIURIL) 25 MG tablet   amLODipine (NORVASC) 5 MG tablet   Essential hypertension, benign (Chronic)    With fluctuating BP; cardiologist  advised increasing the carvedilol; will increase to 6.25 mg BID; patient reports no problems with BID drug; she'll continue other meds; now that we're under better control, no renal problems on [place], okay to cancel appt with nephrologist; she'll monitor her BP and contact me if not to goal      Relevant Medications   carvedilol (COREG) 6.25 MG tablet   hydrochlorothiazide (HYDRODIURIL) 25 MG tablet   amLODipine (NORVASC) 5 MG tablet     Other   Obesity, Class III, BMI 40-49.9 (morbid obesity) ([place]) (Chronic)    Patient is working on weight loss          Follow up plan: Return in about 3 months (around 07/28/2018) for follow-up visit with Dr. Sanda Klein.  An after-visit summary was printed and given to the patient at [place].  Please see the patient instructions which may contain other information and recommendations beyond what is mentioned above in the assessment and plan.  Meds ordered this encounter  Medications  . carvedilol (COREG) 6.25 MG tablet    Sig: Take 1 tablet (6.25 mg total) by mouth 2 (two) times daily with a meal.    Dispense:  180 tablet    Refill:  3  . hydrochlorothiazide (HYDRODIURIL) 25 MG tablet    Sig: Take 1 tablet (25 mg total) by mouth daily.    Dispense:  90 tablet    Refill:  3  . amLODipine (NORVASC) 5 MG tablet    Sig: Take 1 tablet (5 mg total) by mouth daily.    Dispense:  90 tablet    Refill:  3    No orders of the defined types were placed in this encounter.

## 2018-04-28 ENCOUNTER — Ambulatory Visit (INDEPENDENT_AMBULATORY_CARE_PROVIDER_SITE_OTHER): Payer: 59 | Admitting: Physician Assistant

## 2018-04-28 MED ORDER — NORETHINDRONE 0.35 MG PO TABS: 1.0000 | ORAL_TABLET | Freq: Every day | ORAL | 3 refills | Status: DC

## 2018-04-29 DIAGNOSIS — R000 Tachycardia, unspecified: Secondary | ICD-10-CM | POA: Diagnosis not present

## 2018-04-29 DIAGNOSIS — R232 Flushing: Secondary | ICD-10-CM | POA: Diagnosis not present

## 2018-04-29 DIAGNOSIS — I10 Essential (primary) hypertension: Secondary | ICD-10-CM | POA: Diagnosis not present

## 2018-05-03 LAB — CATECHOLAMINE+VMA, 24-HR URINE
VMA 24H UR ADULT: 3.3 mg/(24.h) (ref 0.0–7.5)
VMA, Urine: 3.3 mg/L

## 2018-05-13 ENCOUNTER — Ambulatory Visit: Payer: 59 | Admitting: Licensed Clinical Social Worker

## 2018-05-24 ENCOUNTER — Ambulatory Visit: Payer: 59 | Admitting: Psychiatry

## 2018-05-24 ENCOUNTER — Encounter: Payer: Self-pay | Admitting: Family Medicine

## 2018-05-24 MED ORDER — AMLODIPINE BESYLATE 10 MG PO TABS: 10.0000 mg | ORAL_TABLET | Freq: Every day | ORAL | 2 refills | Status: DC

## 2018-05-24 MED ORDER — AMLODIPINE BESYLATE 5 MG PO TABS: 5.0000 mg | ORAL_TABLET | Freq: Two times a day (BID) | ORAL | 1 refills | Status: DC

## 2018-05-24 NOTE — Addendum Note (Signed)
Addended by: Creasie Lacosse, Satira Anis on: 05/24/2018 03:27 PM   Modules accepted: Orders

## 2018-05-25 ENCOUNTER — Other Ambulatory Visit: Payer: Self-pay | Admitting: Family Medicine

## 2018-05-25 NOTE — Telephone Encounter (Signed)
Request 90 day

## 2018-07-28 ENCOUNTER — Encounter: Payer: Self-pay | Admitting: Family Medicine

## 2018-07-28 ENCOUNTER — Ambulatory Visit (INDEPENDENT_AMBULATORY_CARE_PROVIDER_SITE_OTHER): Payer: 59 | Admitting: Family Medicine

## 2018-07-28 VITALS — BP 120/74 | HR 88 | Temp 98.2°F | Resp 12 | Ht 69.0 in | Wt 282.5 lb

## 2018-07-28 DIAGNOSIS — F3340 Major depressive disorder, recurrent, in remission, unspecified: Secondary | ICD-10-CM

## 2018-07-28 DIAGNOSIS — I517 Cardiomegaly: Secondary | ICD-10-CM | POA: Diagnosis not present

## 2018-07-28 DIAGNOSIS — N926 Irregular menstruation, unspecified: Secondary | ICD-10-CM

## 2018-07-28 DIAGNOSIS — I10 Essential (primary) hypertension: Secondary | ICD-10-CM

## 2018-07-28 MED ORDER — AMLODIPINE BESYLATE 10 MG PO TABS: 10.0000 mg | ORAL_TABLET | Freq: Every day | ORAL | 3 refills | Status: DC

## 2018-07-28 NOTE — Assessment & Plan Note (Signed)
PHQ-9 score is 1 today

## 2018-07-28 NOTE — Progress Notes (Signed)
BP 120/74   Pulse 88   Temp 98.2 F (36.8 C) (Oral)   Resp 12   Ht 5' 9"  (1.753 m)   Wt 282 lb 8 oz (128.1 kg)   LMP 05/05/2018   SpO2 97%   BMI 41.72 kg/m    Subjective:    Patient ID: Natalie Petersen, female    DOB: Jul 01, 1976, 42 y.o.   MRN: [id number]  HPI: Natalie Petersen is a 42 y.o. female  Chief Complaint  Patient presents with  . Follow-up  . Medication Refill    needs rx sent to mail order    HPI Patient is here for follow-up  She has hypertension She takes amlodipine 10 mg daily, carvedilol 6.25 mg BID, and HCTZ 25 mg daily Checking BP at home but cuff may not fit right, higher at home  She is on the new birth control since May; she had a lighter period the first month, and no period after that; negative pregnancy test at home in June; waited a week and then did it again  Doing well with her mood; seeing Dr. Shea Evans, on wellbutrin plus sertraline; sleeping well  Morbid obesity; she thinks she will get back on track; drinking enough water; not tracking steps, will put that back on; not eating out more than once a week; does not want referral to nutritionist; did healthy weight center in [place], she has menu, scale  Depression screen [facility] 2/9 07/28/2018 07/28/2018 04/08/2018 02/11/2018 10/13/2017  Decreased Interest 0 0 0 3 0  Down, Depressed, Hopeless 0 0 0 2 0  PHQ - 2 Score 0 0 0 5 0  Altered sleeping 0 - - 1 -  Tired, decreased energy 1 - - 3 -  Change in appetite 0 - - 2 -  Feeling bad or failure about yourself  0 - - 1 -  Trouble concentrating 0 - - 0 -  Moving slowly or fidgety/restless 0 - - 0 -  Suicidal thoughts 0 - - 0 -  PHQ-9 Score 1 - - 12 -  Difficult doing work/chores Not difficult at all - - Not difficult at all -    Relevant past medical, surgical, family and social history reviewed Past Medical History:  Diagnosis Date  . Allergy    seasonal  . Anxiety   . Depression   . Menopausal symptoms   . Metrorrhagia   . Obesity, Class III, BMI  40-49.9 (morbid obesity) ([place]) 07/16/2015  . Vitamin D deficiency    Past Surgical History:  Procedure Laterality Date  . BRAIN SURGERY     Family History  Problem Relation Age of Onset  . Heart murmur Mother   . Atrial fibrillation Mother   . Arthritis Father        RA  . Hyperlipidemia Father   . Cancer Maternal Aunt        ovarian and uterine   . Arthritis Maternal Grandmother   . Heart attack Maternal Grandfather   . Stroke Paternal Grandmother   . Arthritis Paternal Grandmother   . Breast cancer Paternal Grandmother   . Stroke Paternal Uncle    Social History   Tobacco Use  . Smoking status: Never Smoker  . Smokeless tobacco: Never Used  Substance Use Topics  . Alcohol use: Yes    Alcohol/week: 1.2 oz    Types: 2 Glasses of wine per week    Frequency: Never  . Drug use: No    Interim medical history since  last visit reviewed. Allergies and medications reviewed  Review of Systems Per HPI unless specifically indicated above     Objective:    BP 120/74   Pulse 88   Temp 98.2 F (36.8 C) (Oral)   Resp 12   Ht 5' 9"  (1.753 m)   Wt 282 lb 8 oz (128.1 kg)   LMP 05/05/2018   SpO2 97%   BMI 41.72 kg/m   Wt Readings from Last 3 Encounters:  07/28/18 282 lb 8 oz (128.1 kg)  04/27/18 282 lb 4.8 oz (128.1 kg)  04/08/18 286 lb 1.6 oz (129.8 kg)    Physical Exam  Constitutional: She appears well-developed and well-nourished. No distress.  HENT:  Head: Normocephalic and atraumatic.  Eyes: EOM are normal. No scleral icterus.  Neck: No thyromegaly present.  Cardiovascular: Normal rate, regular rhythm and normal heart sounds.  No murmur heard. Pulmonary/Chest: Effort normal and breath sounds normal. No respiratory distress. She has no wheezes.  Abdominal: Soft. Bowel sounds are normal. She exhibits no distension.  Musculoskeletal: She exhibits no edema.  Neurological: She is alert.  Skin: Skin is warm and dry. She is not diaphoretic. No pallor.  Psychiatric:  She has a normal mood and affect. Her behavior is normal. Judgment and thought content normal.   Results for orders placed or performed in visit on 04/08/18  CBC with Differential/Platelet  Result Value Ref Range   WBC 8.4 3.4 - 10.8 x10E3/uL   RBC 5.28 3.77 - 5.28 x10E6/uL   Hemoglobin 14.9 11.1 - 15.9 g/dL   Hematocrit 43.5 34.0 - 46.6 %   MCV 82 79 - 97 fL   MCH 28.2 26.6 - 33.0 pg   MCHC 34.3 31.5 - 35.7 g/dL   RDW 14.2 12.3 - 15.4 %   Platelets 366 150 - 379 x10E3/uL   Neutrophils 70 Not Estab. %   Lymphs 23 Not Estab. %   Monocytes 6 Not Estab. %   Eos 1 Not Estab. %   Basos 0 Not Estab. %   Neutrophils Absolute 5.8 1.4 - 7.0 x10E3/uL   Lymphocytes Absolute 1.9 0.7 - 3.1 x10E3/uL   Monocytes Absolute 0.5 0.1 - 0.9 x10E3/uL   EOS (ABSOLUTE) 0.1 0.0 - 0.4 x10E3/uL   Basophils Absolute 0.0 0.0 - 0.2 x10E3/uL   Immature Granulocytes 0 Not Estab. %   Immature Grans (Abs) 0.0 0.0 - 0.1 x10E3/uL  Urine Microalbumin w/creat. ratio  Result Value Ref Range   Creatinine, Urine 230.2 Not Estab. mg/dL   Microalbumin, Urine 9.6 Not Estab. ug/mL   Microalb/Creat Ratio 4.2 0.0 - 30.0 mg/g creat  Catecholamine+VMA, 24-Hr Urine  Result Value Ref Range   VMA, Urine 3.3 Undefined mg/L   VMA, 24H Ur Adult 3.3 0.0 - 7.5 mg/24 hr   Epinephrine, Rand Ur CANCELED ug/L   Epinephrine, 24H Ur CANCELED ug/24 hr   Norepinephrine, Rand Ur CANCELED ug/L   Norepinephrine, 24H Ur CANCELED ug/24 hr   Dopamine, Rand Ur CANCELED ug/L   Dopamine , 24H Ur CANCELED ug/24 hr  Basic Metabolic Panel (BMET)  Result Value Ref Range   Glucose 110 (H) 65 - 99 mg/dL   BUN 12 6 - 24 mg/dL   Creatinine, Ser 0.85 0.57 - 1.00 mg/dL   GFR calc non Af Amer 84 >59 mL/min/1.73   GFR calc Af Amer 97 >59 mL/min/1.73   BUN/Creatinine Ratio 14 9 - 23   Sodium 141 134 - 144 mmol/L   Potassium 4.6 3.5 - 5.2 mmol/L  Chloride 99 96 - 106 mmol/L   CO2 26 20 - 29 mmol/L   Calcium 9.8 8.7 - 10.2 mg/dL  Aldosterone + renin  activity w/ ratio  Result Value Ref Range   ALDOSTERONE 16.9 0.0 - 30.0 ng/dL   Renin 1.344 0.167 - 5.380 ng/mL/hr   ALDOS/RENIN RATIO 12.6 0.0 - 30.0  Magnesium  Result Value Ref Range   Magnesium 1.7 1.6 - 2.3 mg/dL      Assessment & Plan:   Problem List Items Addressed This Visit      Cardiovascular and Mediastinum   LVH (left ventricular hypertrophy) (Chronic)    Weight loss and BP control are key      Relevant Medications   amLODipine (NORVASC) 10 MG tablet   Essential hypertension, benign (Chronic)    Continue medicines; encouraged weight loss      Relevant Medications   amLODipine (NORVASC) 10 MG tablet     Other   Obesity, Class III, BMI 40-49.9 (morbid obesity) (HCC) (Chronic)    Encouraged weight loss      Depression, major, recurrent, in remission (HCC)    PHQ-9 score is 1 today       Other Visit Diagnoses    Missed menses    -  Primary   Relevant Orders   Pregnancy, urine       Follow up plan: Return in about 6 months (around 01/28/2019) for follow-up visit with Dr. Sanda Klein.  An after-visit summary was printed and given to the patient at [place].  Please see the patient instructions which may contain other information and recommendations beyond what is mentioned above in the assessment and plan.  Meds ordered this encounter  Medications  . amLODipine (NORVASC) 10 MG tablet    Sig: Take 1 tablet (10 mg total) by mouth daily.    Dispense:  90 tablet    Refill:  3    This is the correct dose, cancel any refills you have for 5 mg    Orders Placed This Encounter  Procedures  . Pregnancy, urine

## 2018-07-28 NOTE — Patient Instructions (Addendum)
Check out the information at familydoctor.org entitled "Nutrition for Weight Loss: What You Need to Know about Fad Diets" Try to lose between 1-2 pounds per week by taking in fewer calories and burning off more calories You can succeed by limiting portions, limiting foods dense in calories and fat, becoming more active, and drinking 8 glasses of water a day (64 ounces) Don't skip meals, especially breakfast, as skipping meals may alter your metabolism Do not use over-the-counter weight loss pills or gimmicks that claim rapid weight loss A healthy BMI (or body mass index) is between 18.5 and 24.9 You can calculate your ideal BMI at the [place] website ClubMonetize.fr   Obesity, Adult Obesity is the condition of having too much total body fat. Being overweight or obese means that your weight is greater than what is considered healthy for your body size. Obesity is determined by a measurement called BMI. BMI is an estimate of body fat and is calculated from height and weight. For adults, a BMI of 30 or higher is considered obese. Obesity can eventually lead to other health concerns and major illnesses, including:  Stroke.  Coronary artery disease (CAD).  Type 2 diabetes.  Some types of cancer, including cancers of the colon, breast, uterus, and gallbladder.  Osteoarthritis.  High blood pressure (hypertension).  High cholesterol.  Sleep apnea.  Gallbladder stones.  Infertility problems.  What are the causes? The main cause of obesity is taking in (consuming) more calories than your body uses for energy. Other factors that contribute to this condition may include:  Being born with genes that make you more likely to become obese.  Having a medical condition that causes obesity. These conditions include: ? Hypothyroidism. ? Polycystic ovarian syndrome (PCOS). ? Binge-eating disorder. ? Cushing syndrome.  Taking certain medicines,  such as steroids, antidepressants, and seizure medicines.  Not being physically active (sedentary lifestyle).  Living where there are limited places to exercise safely or buy healthy foods.  Not getting enough sleep.  What increases the risk? The following factors may increase your risk of this condition:  Having a family history of obesity.  Being a woman of African-American descent.  Being a man of Hispanic descent.  What are the signs or symptoms? Having excessive body fat is the main symptom of this condition. How is this diagnosed? This condition may be diagnosed based on:  Your symptoms.  Your medical history.  A physical exam. Your health care provider may measure: ? Your BMI. If you are an adult with a BMI between 25 and less than 30, you are considered overweight. If you are an adult with a BMI of 30 or higher, you are considered obese. ? The distances around your hips and your waist (circumferences). These may be compared to each other to help diagnose your condition. ? Your skinfold thickness. Your health care provider may gently pinch a fold of your skin and measure it.  How is this treated? Treatment for this condition often includes changing your lifestyle. Treatment may include some or all of the following:  Dietary changes. Work with your health care provider and a dietitian to set a weight-loss goal that is healthy and reasonable for you. Dietary changes may include eating: ? Smaller portions. A portion size is the amount of a particular food that is healthy for you to eat at one time. This varies from person to person. ? Low-calorie or low-fat options. ? More whole grains, fruits, and vegetables.  Regular physical activity. This may  include aerobic activity (cardio) and strength training.  Medicine to help you lose weight. Your health care provider may prescribe medicine if you are unable to lose 1 pound a week after 6 weeks of eating more healthily and  doing more physical activity.  Surgery. Surgical options may include gastric banding and gastric bypass. Surgery may be done if: ? Other treatments have not helped to improve your condition. ? You have a BMI of 40 or higher. ? You have life-threatening health problems related to obesity.  Follow these instructions at home:  Eating and drinking   Follow recommendations from your health care provider about what you eat and drink. Your health care provider may advise you to: ? Limit fast foods, sweets, and processed snack foods. ? Choose low-fat options, such as low-fat milk instead of whole milk. ? Eat 5 or more servings of fruits or vegetables every day. ? Eat at home more often. This gives you more control over what you eat. ? Choose healthy foods when you eat out. ? Learn what a healthy portion size is. ? Keep low-fat snacks on hand. ? Avoid sugary drinks, such as soda, fruit juice, iced tea sweetened with sugar, and flavored milk. ? Eat a healthy breakfast.  Drink enough water to keep your urine clear or pale yellow.  Do not go without eating for long periods of time (do not fast) or follow a fad diet. Fasting and fad diets can be unhealthy and even dangerous. Physical Activity  Exercise regularly, as told by your health care provider. Ask your health care provider what types of exercise are safe for you and how often you should exercise.  Warm up and stretch before being active.  Cool down and stretch after being active.  Rest between periods of activity. Lifestyle  Limit the time that you spend in front of your TV, computer, or video game system.  Find ways to reward yourself that do not involve food.  Limit alcohol intake to no more than 1 drink a day for nonpregnant women and 2 drinks a day for men. One drink equals 12 oz of beer, 5 oz of wine, or 1 oz of hard liquor. General instructions  Keep a weight loss journal to keep track of the food you eat and how much you  exercise you get.  Take over-the-counter and prescription medicines only as told by your health care provider.  Take vitamins and supplements only as told by your health care provider.  Consider joining a support group. Your health care provider may be able to recommend a support group.  Keep all follow-up visits as told by your health care provider. This is important. Contact a health care provider if:  You are unable to meet your weight loss goal after 6 weeks of dietary and lifestyle changes. This information is not intended to replace advice given to you by your health care provider. Make sure you discuss any questions you have with your health care provider. Document Released: 01/15/2005 Document Revised: 05/12/2016 Document Reviewed: 09/26/2015 Elsevier Interactive Patient Education  2018 [place].  Preventing Unhealthy [employer], Adult Staying at a healthy weight is important. When fat builds up in your body, you may become overweight or obese. These conditions put you at greater risk for developing certain health problems, such as heart disease, diabetes, sleeping problems, joint problems, and some cancers. Unhealthy weight gain is often the result of making unhealthy choices in what you eat. It is also a result of  not getting enough exercise. You can make changes to your lifestyle to prevent obesity and stay as healthy as possible. What nutrition changes can be made? To maintain a healthy weight and prevent obesity:  Eat only as much as your body needs. To do this: ? Pay attention to signs that you are hungry or full. Stop eating as soon as you feel full. ? If you feel hungry, try drinking water first. Drink enough water so your urine is clear or pale yellow. ? Eat smaller portions. ? Look at serving sizes on food labels. Most foods contain more than one serving per container. ? Eat the recommended amount of calories for your gender and activity level. While most active  people should eat around 2,000 calories per day, if you are trying to lose weight or are not very active, you main need to eat less calories. Talk to your health care provider or dietitian about how many calories you should eat each day.  Choose healthy foods, such as: ? Fruits and vegetables. Try to fill at least half of your plate at each meal with fruits and vegetables. ? Whole grains, such as whole wheat bread, brown rice, and quinoa. ? Lean meats, such as chicken or fish. ? Other healthy proteins, such as beans, eggs, or tofu. ? Healthy fats, such as nuts, seeds, fatty fish, and olive oil. ? Low-fat or fat-free dairy.  Check food labels and avoid food and drinks that: ? Are high in calories. ? Have added sugar. ? Are high in sodium. ? Have saturated fats or trans fats.  Limit how much you eat of the following foods: ? Prepackaged meals. ? Fast food. ? Fried foods. ? Processed meat, such as bacon, sausage, and deli meats. ? Fatty cuts of red meat and poultry with skin.  Cook foods in healthier ways, such as by baking, broiling, or grilling.  When grocery shopping, try to shop around the outside of the store. This helps you buy mostly fresh foods and avoid canned and prepackaged foods.  What lifestyle changes can be made?  Exercise at least 30 minutes 5 or more days each week. Exercising includes brisk walking, yard work, biking, running, swimming, and team sports like basketball and soccer. Ask your health care provider which exercises are safe for you.  Do not use any products that contain nicotine or tobacco, such as cigarettes and e-cigarettes. If you need help quitting, ask your health care provider.  Limit alcohol intake to no more than 1 drink a day for nonpregnant women and 2 drinks a day for men. One drink equals 12 oz of beer, 5 oz of wine, or 1 oz of hard liquor.  Try to get 7-9 hours of sleep each night. What other changes can be made?  Keep a food and activity  journal to keep track of: ? What you ate and how many calories you had. Remember to count sauces, dressings, and side dishes. ? Whether you were active, and what exercises you did. ? Your calorie, weight, and activity goals.  Check your weight regularly. Track any changes. If you notice you have gained weight, make changes to your diet or activity routine.  Avoid taking weight-loss medicines or supplements. Talk to your health care provider before starting any new medicine or supplement.  Talk to your health care provider before trying any new diet or exercise plan. Why are these changes important? Eating healthy, staying active, and having healthy habits not only help prevent obesity, they  also:  Help you to manage stress and emotions.  Help you to connect with friends and family.  Improve your self-esteem.  Improve your sleep.  Prevent long-term health problems.  What can happen if changes are not made? Being obese or overweight can cause you to develop joint or bone problems, which can make it hard for you to stay active or do activities you enjoy. Being obese or overweight also puts stress on your heart and lungs and can lead to health problems like diabetes, heart disease, and some cancers. Where to find more information: Talk with your health care provider or a dietitian about healthy eating and healthy lifestyle choices. You may also find other information through these resources:  U.S. Department of Agriculture MyPlate: FormerBoss.no  American Heart Association: www.heart.org  Centers for Disease Control and Prevention: http://www.wolf.info/  Summary  Staying at a healthy weight is important. It helps prevent certain diseases and health problems, such as heart disease, diabetes, joint problems, sleep disorders, and some cancers.  Being obese or overweight can cause you to develop joint or bone problems, which can make it hard for you to stay active or do activities you  enjoy.  You can prevent unhealthy weight gain by eating a healthy diet, exercising regularly, not smoking, limiting alcohol, and getting enough sleep.  Talk with your health care provider or a dietitian for guidance about healthy eating and healthy lifestyle choices. This information is not intended to replace advice given to you by your health care provider. Make sure you discuss any questions you have with your health care provider. Document Released: 12/09/2016 Document Revised: 01/14/2017 Document Reviewed: 01/14/2017 Elsevier Interactive Patient Education  2018 [employer].  Left bicep: 14 inches, 36 centimeters Right bicep: 14 inches, 36 centimeters  I'll encourage you and your partner to consider vasectomy as an option for you both to avoid putting drugs in your body which may have serious side effects

## 2018-07-28 NOTE — Assessment & Plan Note (Signed)
Encouraged weight loss

## 2018-07-28 NOTE — Assessment & Plan Note (Signed)
Weight loss and BP control are key

## 2018-07-28 NOTE — Assessment & Plan Note (Signed)
Continue medicines; encouraged weight loss

## 2018-08-23 ENCOUNTER — Other Ambulatory Visit: Payer: Self-pay | Admitting: Family Medicine

## 2018-08-30 ENCOUNTER — Other Ambulatory Visit: Payer: Self-pay | Admitting: Family Medicine

## 2018-08-30 DIAGNOSIS — R928 Other abnormal and inconclusive findings on diagnostic imaging of breast: Secondary | ICD-10-CM | POA: Diagnosis not present

## 2018-08-30 DIAGNOSIS — N6320 Unspecified lump in the left breast, unspecified quadrant: Secondary | ICD-10-CM

## 2018-08-30 DIAGNOSIS — N6321 Unspecified lump in the left breast, upper outer quadrant: Secondary | ICD-10-CM | POA: Diagnosis not present

## 2018-08-30 DIAGNOSIS — N6323 Unspecified lump in the left breast, lower outer quadrant: Secondary | ICD-10-CM | POA: Diagnosis not present

## 2018-09-14 ENCOUNTER — Encounter: Payer: Self-pay | Admitting: Family Medicine

## 2018-09-28 ENCOUNTER — Ambulatory Visit
Admission: RE | Admit: 2018-09-28 | Discharge: 2018-09-28 | Disposition: A | Discharge status: Home / Self Care | Payer: 59 | Source: Ambulatory Visit | Attending: Family Medicine | Admitting: Family Medicine

## 2018-09-28 DIAGNOSIS — N6320 Unspecified lump in the left breast, unspecified quadrant: Secondary | ICD-10-CM | POA: Insufficient documentation

## 2018-09-28 DIAGNOSIS — N6321 Unspecified lump in the left breast, upper outer quadrant: Secondary | ICD-10-CM | POA: Diagnosis not present

## 2018-09-28 DIAGNOSIS — N6323 Unspecified lump in the left breast, lower outer quadrant: Secondary | ICD-10-CM | POA: Diagnosis not present

## 2018-09-28 DIAGNOSIS — R928 Other abnormal and inconclusive findings on diagnostic imaging of breast: Secondary | ICD-10-CM | POA: Diagnosis not present

## 2018-10-08 DIAGNOSIS — R203 Hyperesthesia: Secondary | ICD-10-CM | POA: Diagnosis not present

## 2018-10-08 DIAGNOSIS — L659 Nonscarring hair loss, unspecified: Secondary | ICD-10-CM | POA: Diagnosis not present

## 2018-10-08 DIAGNOSIS — L649 Androgenic alopecia, unspecified: Secondary | ICD-10-CM | POA: Diagnosis not present

## 2018-10-12 ENCOUNTER — Encounter: Payer: Self-pay | Admitting: Family Medicine

## 2018-10-18 ENCOUNTER — Encounter: Payer: 59 | Admitting: Family Medicine

## 2018-10-19 ENCOUNTER — Other Ambulatory Visit: Payer: Self-pay | Admitting: Psychiatry

## 2018-10-19 DIAGNOSIS — F330 Major depressive disorder, recurrent, mild: Secondary | ICD-10-CM

## 2018-10-26 ENCOUNTER — Telehealth: Payer: Self-pay

## 2018-10-26 NOTE — Telephone Encounter (Signed)
pt was called and that according to our [place] office she had called and asked for a medication refill. pt confirmed that she did.  pt was told that she would need to set up an appt to be seen because she had not been seen since may.  pt seem to be upset because she was told that she needed an appt to be seen. Pt states it makes no sense to have to keep coming in for appts.  Pt was made an appt for next Monday.   Pt asked if she can received a weeks worth of medication until her appt.   Pt was advised that a message would be sent to the doctor.  Pt uses [facility] on [address].

## 2018-10-26 NOTE — Telephone Encounter (Signed)
ragen at [facility] office called left a message that the patient called [place] and wanted to get a refill but patient has not been seen since may. ragen did state that she told pt that she would need an appt.

## 2018-10-27 ENCOUNTER — Other Ambulatory Visit: Payer: Self-pay | Admitting: Psychiatry

## 2018-10-27 ENCOUNTER — Telehealth: Payer: Self-pay | Admitting: Psychiatry

## 2018-10-27 ENCOUNTER — Encounter: Payer: Self-pay | Admitting: Family Medicine

## 2018-10-27 DIAGNOSIS — F330 Major depressive disorder, recurrent, mild: Secondary | ICD-10-CM

## 2018-10-27 MED ORDER — BUPROPION HCL ER (XL) 300 MG PO TB24: 300.0000 mg | ORAL_TABLET | Freq: Every day | ORAL | 0 refills | Status: DC

## 2018-10-27 MED ORDER — SERTRALINE HCL 50 MG PO TABS: ORAL_TABLET | ORAL | 0 refills | Status: DC

## 2018-10-27 MED ORDER — BUPROPION HCL ER (XL) 150 MG PO TB24: 150.0000 mg | ORAL_TABLET | Freq: Every day | ORAL | 0 refills | Status: DC

## 2018-10-27 NOTE — Telephone Encounter (Signed)
I have sent 30 days to her pharmacy

## 2018-10-27 NOTE — Telephone Encounter (Signed)
I have sent 30 days supply of her medications,that will help her till she sees me for an appointment.

## 2018-10-28 ENCOUNTER — Ambulatory Visit (INDEPENDENT_AMBULATORY_CARE_PROVIDER_SITE_OTHER): Payer: Self-pay | Admitting: Family Medicine

## 2018-10-28 ENCOUNTER — Encounter: Payer: Self-pay | Admitting: Family Medicine

## 2018-10-28 VITALS — BP 138/82 | HR 99 | Temp 98.7°F | Ht 69.0 in | Wt 288.3 lb

## 2018-10-28 DIAGNOSIS — L659 Nonscarring hair loss, unspecified: Secondary | ICD-10-CM

## 2018-10-28 DIAGNOSIS — R739 Hyperglycemia, unspecified: Secondary | ICD-10-CM

## 2018-10-28 DIAGNOSIS — I10 Essential (primary) hypertension: Secondary | ICD-10-CM

## 2018-10-28 DIAGNOSIS — R718 Other abnormality of red blood cells: Secondary | ICD-10-CM

## 2018-10-28 DIAGNOSIS — R5383 Other fatigue: Secondary | ICD-10-CM

## 2018-10-28 DIAGNOSIS — R635 Abnormal weight gain: Secondary | ICD-10-CM | POA: Diagnosis not present

## 2018-10-28 DIAGNOSIS — E559 Vitamin D deficiency, unspecified: Secondary | ICD-10-CM

## 2018-10-28 HISTORY — DX: Other abnormality of red blood cells: R71.8

## 2018-10-28 MED ORDER — BUPROPION HCL ER (XL) 150 MG PO TB24: 150.0000 mg | ORAL_TABLET | Freq: Every day | ORAL | 0 refills | Status: DC

## 2018-10-28 MED ORDER — CARVEDILOL 12.5 MG PO TABS: 12.5000 mg | ORAL_TABLET | Freq: Two times a day (BID) | ORAL | 3 refills | Status: DC

## 2018-10-28 MED ORDER — AMLODIPINE BESYLATE 10 MG PO TABS: 5.0000 mg | ORAL_TABLET | Freq: Every day | ORAL | Status: DC

## 2018-10-28 MED ORDER — BUPROPION HCL ER (XL) 300 MG PO TB24: 300.0000 mg | ORAL_TABLET | Freq: Every day | ORAL | 0 refills | Status: DC

## 2018-10-28 NOTE — Progress Notes (Signed)
BP 138/82   Pulse 99   Temp 98.7 F (37.1 C)   Ht 5' 9"  (1.753 m)   Wt 288 lb 4.8 oz (130.8 kg)   LMP 10/07/2018   SpO2 98%   BMI 42.57 kg/m    Subjective:    Patient ID: Natalie Petersen, female    DOB: 18-Oct-1976, 42 y.o.   MRN: [id number]  HPI: Natalie Petersen is a 42 y.o. female  Chief Complaint  Patient presents with  . Follow-up    Stopped taking Zoloft and birth control and has had a constant feeling of feeling like she is jetlagged and out of sorts.     HPI  She feels like "extreme jet lag" or when she hasn't slept for days Up in the head, feels kind of cloudy; not like she is going to pass out Feels like that with feeling overly tired; slept 9 hours last night, restful, had mouth guard in, no apnea or snoring Stopped the sertraline b/c of concern about hair loss; derm said that and birth control could cause hair loss; only 50 mg Wellbutrin sent in by psychiatrist as well as sertraline yesterday; taking that consistently Gaining weight; 6 pounds up from last visit; she was 290 on one day, then 281, so up and down, all over the map Good eater; doing the healthy weight and maintenance with veggies and protein, low sugar fruits; not drinking much caffeine; one cup of coffee in the am When she gets agitated or stressed at work, has hot flashes, down the neck turns red and has a rash for a while; feels flushed, with getting stressed or upset; starts fanning herself Stools are normal Hair loss is difficult to tell, maybe slowed down a little; all over the scalp; no discrete patches of alopecia Higher sugars in the past when on prednisone; little dry mouth; no excessive urination  Depression screen [facility] 2/9 10/28/2018 07/28/2018 07/28/2018 04/08/2018 02/11/2018  Decreased Interest 0 0 0 0 3  Down, Depressed, Hopeless 0 0 0 0 2  PHQ - 2 Score 0 0 0 0 5  Altered sleeping 3 0 - - 1  Tired, decreased energy 3 1 - - 3  Change in appetite 0 0 - - 2  Feeling bad or failure about yourself  0  0 - - 1  Trouble concentrating 0 0 - - 0  Moving slowly or fidgety/restless 0 0 - - 0  Suicidal thoughts 0 0 - - 0  PHQ-9 Score 6 1 - - 12  Difficult doing work/chores Not difficult at all Not difficult at all - - Not difficult at all   Fall Risk  10/28/2018 07/28/2018 04/08/2018 10/13/2017 09/09/2016  Falls in the past year? 0 No No No No    Relevant past medical, surgical, family and social history reviewed Past Medical History:  Diagnosis Date  . Allergy    seasonal  . Anxiety   . Depression   . Menopausal symptoms   . Metrorrhagia   . Obesity, Class III, BMI 40-49.9 (morbid obesity) ([place]) 07/16/2015  . Vitamin D deficiency    Past Surgical History:  Procedure Laterality Date  . BRAIN SURGERY     Family History  Problem Relation Age of Onset  . Heart murmur Mother   . Atrial fibrillation Mother   . Arthritis Father        RA  . Hyperlipidemia Father   . Cancer Maternal Aunt        ovarian and  uterine   . Arthritis Maternal Grandmother   . Heart attack Maternal Grandfather   . Stroke Paternal Grandmother   . Arthritis Paternal Grandmother   . Breast cancer Paternal Grandmother   . Stroke Paternal Uncle    Social History   Tobacco Use  . Smoking status: Never Smoker  . Smokeless tobacco: Never Used  Substance Use Topics  . Alcohol use: Yes    Alcohol/week: 2.0 standard drinks    Types: 2 Glasses of wine per week    Frequency: Never  . Drug use: No     Office Visit from 10/28/2018 in [facility]  AUDIT-C Score  3      Interim medical history since last visit reviewed. Allergies and medications reviewed  Review of Systems Per HPI unless specifically indicated above     Objective:    BP 138/82   Pulse 99   Temp 98.7 F (37.1 C)   Ht 5' 9"  (1.753 m)   Wt 288 lb 4.8 oz (130.8 kg)   LMP 10/07/2018   SpO2 98%   BMI 42.57 kg/m   Wt Readings from Last 3 Encounters:  10/28/18 288 lb 4.8 oz (130.8 kg)  07/28/18 282 lb 8 oz (128.1 kg)   04/27/18 282 lb 4.8 oz (128.1 kg)    Physical Exam  Constitutional: She appears well-developed and well-nourished.  Morbidly obese  HENT:  Mouth/Throat: Mucous membranes are normal.  Eyes: EOM are normal. No scleral icterus.  Cardiovascular: Normal rate and regular rhythm.  Pulmonary/Chest: Effort normal and breath sounds normal.  Neurological: She is alert.  Skin:  No discrete alopecia  Psychiatric: She has a normal mood and affect. Her behavior is normal.    Results for orders placed or performed in visit on 04/08/18  CBC with Differential/Platelet  Result Value Ref Range   WBC 8.4 3.4 - 10.8 x10E3/uL   RBC 5.28 3.77 - 5.28 x10E6/uL   Hemoglobin 14.9 11.1 - 15.9 g/dL   Hematocrit 43.5 34.0 - 46.6 %   MCV 82 79 - 97 fL   MCH 28.2 26.6 - 33.0 pg   MCHC 34.3 31.5 - 35.7 g/dL   RDW 14.2 12.3 - 15.4 %   Platelets 366 150 - 379 x10E3/uL   Neutrophils 70 Not Estab. %   Lymphs 23 Not Estab. %   Monocytes 6 Not Estab. %   Eos 1 Not Estab. %   Basos 0 Not Estab. %   Neutrophils Absolute 5.8 1.4 - 7.0 x10E3/uL   Lymphocytes Absolute 1.9 0.7 - 3.1 x10E3/uL   Monocytes Absolute 0.5 0.1 - 0.9 x10E3/uL   EOS (ABSOLUTE) 0.1 0.0 - 0.4 x10E3/uL   Basophils Absolute 0.0 0.0 - 0.2 x10E3/uL   Immature Granulocytes 0 Not Estab. %   Immature Grans (Abs) 0.0 0.0 - 0.1 x10E3/uL  Urine Microalbumin w/creat. ratio  Result Value Ref Range   Creatinine, Urine 230.2 Not Estab. mg/dL   Microalbumin, Urine 9.6 Not Estab. ug/mL   Microalb/Creat Ratio 4.2 0.0 - 30.0 mg/g creat  Catecholamine+VMA, 24-Hr Urine  Result Value Ref Range   VMA, Urine 3.3 Undefined mg/L   VMA, 24H Ur Adult 3.3 0.0 - 7.5 mg/24 hr   Epinephrine, Rand Ur CANCELED ug/L   Epinephrine, 24H Ur CANCELED ug/24 hr   Norepinephrine, Rand Ur CANCELED ug/L   Norepinephrine, 24H Ur CANCELED ug/24 hr   Dopamine, Rand Ur CANCELED ug/L   Dopamine , 24H Ur CANCELED ug/24 hr  Basic Metabolic Panel (BMET)  Result Value Ref Range    Glucose 110 (H) 65 - 99 mg/dL   BUN 12 6 - 24 mg/dL   Creatinine, Ser 0.85 0.57 - 1.00 mg/dL   GFR calc non Af Amer 84 >59 mL/min/1.73   GFR calc Af Amer 97 >59 mL/min/1.73   BUN/Creatinine Ratio 14 9 - 23   Sodium 141 134 - 144 mmol/L   Potassium 4.6 3.5 - 5.2 mmol/L   Chloride 99 96 - 106 mmol/L   CO2 26 20 - 29 mmol/L   Calcium 9.8 8.7 - 10.2 mg/dL  Aldosterone + renin activity w/ ratio  Result Value Ref Range   ALDOSTERONE 16.9 0.0 - 30.0 ng/dL   Renin 1.344 0.167 - 5.380 ng/mL/hr   ALDOS/RENIN RATIO 12.6 0.0 - 30.0  Magnesium  Result Value Ref Range   Magnesium 1.7 1.6 - 2.3 mg/dL      Assessment & Plan:   Problem List Items Addressed This Visit      Cardiovascular and Mediastinum   Essential hypertension, benign - Primary (Chronic)    Increase the coreg; decrease the amlo from 10 to 5; continue HCTZ; DASH guidelines, weight loss; monitor pulse and BP with CMA here in 1 week      Relevant Medications   carvedilol (COREG) 12.5 MG tablet   amLODipine (NORVASC) 10 MG tablet     Other   Vitamin D deficiency    Check level today and supplement if needed      Relevant Orders   VITAMIN D 25 Hydroxy (Vit-D Deficiency, Fractures)    Other Visit Diagnoses    Weight gain, abnormal       check thyroid tests   Relevant Orders   TSH   Hair loss       seeing derm, perhaps related to meds   Hyperglycemia       check labs today   Relevant Orders   COMPLETE METABOLIC PANEL WITH GFR   Hemoglobin A1c   Other fatigue       check labs today   Relevant Orders   CBC with Differential/Platelet   COMPLETE METABOLIC PANEL WITH GFR   Vitamin B12       Follow up plan: Return in about 1 week (around 11/04/2018) for blood pressure and pulse recheck with CMA.  An after-visit summary was printed and given to the patient at [place].  Please see the patient instructions which may contain other information and recommendations beyond what is mentioned above in the assessment and  plan.  Meds ordered this encounter  Medications  . carvedilol (COREG) 12.5 MG tablet    Sig: Take 1 tablet (12.5 mg total) by mouth 2 (two) times daily with a meal.    Dispense:  60 tablet    Refill:  3    Changing dose  . amLODipine (NORVASC) 10 MG tablet    Sig: Take 0.5 tablets (5 mg total) by mouth daily.    This is the correct dose, cancel any refills you have for 5 mg    Orders Placed This Encounter  Procedures  . CBC with Differential/Platelet  . COMPLETE METABOLIC PANEL WITH GFR  . Hemoglobin A1c  . TSH  . VITAMIN D 25 Hydroxy (Vit-D Deficiency, Fractures)  . Vitamin B12

## 2018-10-28 NOTE — Patient Instructions (Signed)
Increase the carvedilol from 6.25 mg twice a day to 12.5 mg twice a day Decrease the amlodipine from 10 mg daily to 5 mg daily Return in one week for recheck Let's get labs Keep me posted

## 2018-10-28 NOTE — Assessment & Plan Note (Signed)
Increase the coreg; decrease the amlo from 10 to 5; continue HCTZ; DASH guidelines, weight loss; monitor pulse and BP with CMA here in 1 week

## 2018-10-28 NOTE — Assessment & Plan Note (Signed)
Check level today and supplement if needed

## 2018-10-28 NOTE — Telephone Encounter (Signed)
Sent 90 days buproprion since insurance requires it

## 2018-10-28 NOTE — Telephone Encounter (Signed)
  received a fax requesting a 90 day supply of bupropionxl 347m .  " insurance requires 90 day supply"   buPROPion (WELLBUTRIN XL) 300 MG 24 hr tablet  Medication  Date: 10/27/2018 [facility] Ordering/Authorizing: EUrsula Alert MD  Order Providers   Prescribing Provider Encounter Provider  EUrsula Alert MD EUrsula Alert MD  Outpatient Medication Detail    Disp Refills Start End   buPROPion (WELLBUTRIN XL) 300 MG 24 hr tablet 30 tablet 0 10/27/2018    Sig - Route: Take 1 tablet (300 mg total) by mouth daily. To be taken with 150 mg - Oral   Sent to pharmacy as: buPROPion (WELLBUTRIN XL) 300 MG 24 hr tablet   Notes to Pharmacy: **Patient needs appointment   E-Prescribing Status: Receipt confirmed by pharmacy (10/27/2018 10:04 AM EST)

## 2018-10-29 ENCOUNTER — Telehealth: Payer: Self-pay

## 2018-10-29 DIAGNOSIS — R718 Other abnormality of red blood cells: Secondary | ICD-10-CM

## 2018-10-29 LAB — CBC WITH DIFFERENTIAL/PLATELET
BASOS ABS: 52 {cells}/uL (ref 0–200)
Basophils Relative: 0.8 %
EOS ABS: 91 {cells}/uL (ref 15–500)
Eosinophils Relative: 1.4 %
HEMATOCRIT: 46.1 % — AB (ref 35.0–45.0)
Hemoglobin: 15.8 g/dL — ABNORMAL HIGH (ref 11.7–15.5)
LYMPHS ABS: 1801 {cells}/uL (ref 850–3900)
MCH: 28.9 pg (ref 27.0–33.0)
MCHC: 34.3 g/dL (ref 32.0–36.0)
MCV: 84.3 fL (ref 80.0–100.0)
MPV: 9.4 fL (ref 7.5–12.5)
Monocytes Relative: 8 %
Neutro Abs: 4037 cells/uL (ref 1500–7800)
Neutrophils Relative %: 62.1 %
Platelets: 351 10*3/uL (ref 140–400)
RBC: 5.47 10*6/uL — ABNORMAL HIGH (ref 3.80–5.10)
RDW: 13.2 % (ref 11.0–15.0)
Total Lymphocyte: 27.7 %
WBC mixed population: 520 cells/uL (ref 200–950)
WBC: 6.5 10*3/uL (ref 3.8–10.8)

## 2018-10-29 LAB — COMPLETE METABOLIC PANEL WITH GFR
AG Ratio: 1.6 (calc) (ref 1.0–2.5)
ALBUMIN MSPROF: 4.4 g/dL (ref 3.6–5.1)
ALKALINE PHOSPHATASE (APISO): 80 U/L (ref 33–115)
ALT: 26 U/L (ref 6–29)
AST: 17 U/L (ref 10–30)
BUN: 16 mg/dL (ref 7–25)
CO2: 28 mmol/L (ref 20–32)
CREATININE: 0.8 mg/dL (ref 0.50–1.10)
Calcium: 9.7 mg/dL (ref 8.6–10.2)
Chloride: 100 mmol/L (ref 98–110)
GFR, EST AFRICAN AMERICAN: 105 mL/min/{1.73_m2} (ref >=60)
GFR, Est Non African American: 90 mL/min/{1.73_m2} (ref >=60)
GLOBULIN: 2.7 g/dL (ref 1.9–3.7)
Glucose, Bld: 107 mg/dL — ABNORMAL HIGH (ref 65–99)
Potassium: 4 mmol/L (ref 3.5–5.3)
SODIUM: 139 mmol/L (ref 135–146)
TOTAL PROTEIN: 7.1 g/dL (ref 6.1–8.1)
Total Bilirubin: 0.6 mg/dL (ref 0.2–1.2)

## 2018-10-29 LAB — HEMOGLOBIN A1C
HEMOGLOBIN A1C: 5.5 %{Hb} (ref <5.7)
Mean Plasma Glucose: 111 (calc)
eAG (mmol/L): 6.2 (calc)

## 2018-10-29 LAB — VITAMIN D 25 HYDROXY (VIT D DEFICIENCY, FRACTURES): Vit D, 25-Hydroxy: 17 ng/mL — ABNORMAL LOW (ref 30–100)

## 2018-10-29 LAB — VITAMIN B12: VITAMIN B 12: 342 pg/mL (ref 200–1100)

## 2018-10-29 LAB — TSH: TSH: 3.81 m[IU]/L

## 2018-10-29 NOTE — Telephone Encounter (Signed)
-----   Message from Arnetha Courser, MD sent at 10/28/2018  4:48 PM EST ----- Refer to pulmonologist Dr. Ashby Dawes for sleep study evaluation

## 2018-11-01 ENCOUNTER — Other Ambulatory Visit: Payer: Self-pay

## 2018-11-01 ENCOUNTER — Ambulatory Visit (INDEPENDENT_AMBULATORY_CARE_PROVIDER_SITE_OTHER): Payer: 59 | Admitting: Psychiatry

## 2018-11-01 ENCOUNTER — Encounter: Payer: Self-pay | Admitting: Psychiatry

## 2018-11-01 VITALS — BP 132/84 | HR 87 | Temp 99.3°F | Wt 291.0 lb

## 2018-11-01 DIAGNOSIS — F330 Major depressive disorder, recurrent, mild: Secondary | ICD-10-CM | POA: Diagnosis not present

## 2018-11-01 DIAGNOSIS — F411 Generalized anxiety disorder: Secondary | ICD-10-CM | POA: Diagnosis not present

## 2018-11-01 MED ORDER — BUPROPION HCL ER (XL) 300 MG PO TB24: 300.0000 mg | ORAL_TABLET | Freq: Every day | ORAL | 0 refills | Status: DC

## 2018-11-01 NOTE — Telephone Encounter (Signed)
pt was seen today [facility] is requesting a 90 day supply of bupropion sr tabs

## 2018-11-01 NOTE — Progress Notes (Signed)
[facility] MD OP Progress Note  11/01/2018 12:56 PM RAYLEY GAO  MRN:  [id number]  Chief Complaint: ' I am here for follow up.'  Chief Complaint    Follow-up; Medication Refill     HPI: Natalie Petersen is a 42 year old Caucasian female, married, history of depression, anxiety, hypertension, angioedema, lives in [place], presented to the clinic today for a follow-up visit.    Patient today reports she had to stop using the Zoloft since she started having side effects.  She reports she started having significant hair loss.  She went to the dermatologist and it was recommended she stopped the oral contraceptive pill or the Zoloft.  She reports she decided to do both.  She reports she has been off of the Zoloft since the past 3-4 weeks.  She has not noticed much difference in her hair loss yet.  Patient reports she continues to take the Wellbutrin 450 mg p.o. daily.  Patient reports she does not feel depressed or extremely anxious at this time.  She however has noticed herself to be agitated or irritable often.  She reports sleep is good however she feels tired when she wakes up in the morning.  She is scheduled for sleep study consult.  Discussed with patient that Wellbutrin can contribute to agitation or irritability.  Discussed reducing the dosage to 300 mg and she agrees with plan.  Discussed with patient to monitor herself for worsening agitation or depressive symptoms.  Discussed to talk to her husband as well as friends to observe her closely.  Patient denies any suicidality.  Patient denies any perceptual disturbances.  Patient denies any other concerns today. Visit Diagnosis:    ICD-10-CM   1. MDD (major depressive disorder), recurrent episode, mild (HCC) F33.0    IMPROVING  2. GAD (generalized anxiety disorder) F41.1     Past Psychiatric History: I have reviewed past psychiatric history from my progress note on 04/05/2018  Past Medical History:  Past Medical History:  Diagnosis Date  .  Allergy    seasonal  . Anxiety   . Depression   . Menopausal symptoms   . Metrorrhagia   . Obesity, Class III, BMI 40-49.9 (morbid obesity) ([place]) 07/16/2015  . Vitamin D deficiency     Past Surgical History:  Procedure Laterality Date  . BRAIN SURGERY      Family Psychiatric History: I have reviewed family psychiatric history from my progress note on 04/05/2018  Family History:  Family History  Problem Relation Age of Onset  . Heart murmur Mother   . Atrial fibrillation Mother   . Arthritis Father        RA  . Hyperlipidemia Father   . Cancer Maternal Aunt        ovarian and uterine   . Arthritis Maternal Grandmother   . Heart attack Maternal Grandfather   . Stroke Paternal Grandmother   . Arthritis Paternal Grandmother   . Breast cancer Paternal Grandmother   . Stroke Paternal Uncle     Social History: I have reviewed social history from my progress note on 04/05/2018 Social History   Socioeconomic History  . Marital status: Married    Spouse name: Maximo Donaire  . Number of children: 1  . Years of education: Not on file  . Highest education level: Master's degree (e.g., MA, MS, MEng, MEd, MSW, MBA)  Occupational History    Comment: full time  Social Needs  . Financial resource strain: Not hard at all  . Food insecurity:  Worry: Never true    Inability: Never true  . Transportation needs:    Medical: No    Non-medical: No  Tobacco Use  . Smoking status: Never Smoker  . Smokeless tobacco: Never Used  Substance and Sexual Activity  . Alcohol use: Yes    Alcohol/week: 2.0 standard drinks    Types: 2 Glasses of wine per week    Frequency: Never  . Drug use: No  . Sexual activity: Yes    Partners: Male    Birth control/protection: Pill  Lifestyle  . Physical activity:    Days per week: 3 days    Minutes per session: 20 min  . Stress: To some extent  Relationships  . Social connections:    Talks on phone: More than three times a week    Gets  together: Once a week    Attends religious service: Never    Active member of club or organization: No    Attends meetings of clubs or organizations: Never    Relationship status: Married  Other Topics Concern  . Not on file  Social History Narrative  . Not on file    Allergies:  Allergies  Allergen Reactions  . Ace Inhibitors Swelling  . Lisinopril Swelling  . Amoxicillin Hives and Swelling  . Benadryl [Diphenhydramine Hcl (Sleep)] Hives and Swelling    Metabolic Disorder Labs: Lab Results  Component Value Date   HGBA1C 5.5 10/28/2018   MPG 111 10/28/2018   No results found for: PROLACTIN Lab Results  Component Value Date   CHOL 199 02/11/2018   TRIG 121 02/11/2018   HDL 71 02/11/2018   CHOLHDL 2.7 10/13/2017   VLDL 20 09/09/2016   LDLCALC 104 (H) 02/11/2018   LDLCALC 127 (H) 10/13/2017   Lab Results  Component Value Date   TSH 3.81 10/28/2018   TSH 3.640 02/11/2018    Therapeutic Level Labs: No results found for: LITHIUM No results found for: VALPROATE No components found for:  CBMZ  Current Medications: Current Outpatient Medications  Medication Sig Dispense Refill  . amLODipine (NORVASC) 10 MG tablet Take 0.5 tablets (5 mg total) by mouth daily.    Marland Kitchen buPROPion (WELLBUTRIN XL) 300 MG 24 hr tablet Take 1 tablet (300 mg total) by mouth daily. Pt has supplies 90 tablet 0  . carvedilol (COREG) 12.5 MG tablet Take 1 tablet (12.5 mg total) by mouth 2 (two) times daily with a meal. 60 tablet 3  . EPINEPHrine 0.3 mg/0.3 mL IJ SOAJ injection Inject 0.3 mg into the muscle once.     . hydrochlorothiazide (HYDRODIURIL) 25 MG tablet Take 1 tablet (25 mg total) by mouth daily. 90 tablet 3  . Minoxidil (ROGAINE WOMENS) 5 % FOAM Apply topically daily.     No current facility-administered medications for this visit.      Musculoskeletal: Strength & Muscle Tone: within normal limits Gait & Station: normal Patient leans: N/A  Psychiatric Specialty Exam: Review of  Systems  Constitutional: Positive for malaise/fatigue.  Psychiatric/Behavioral: Negative for depression. The patient is not nervous/anxious and does not have insomnia.   All other systems reviewed and are negative.   Blood pressure 132/84, pulse 87, temperature 99.3 F (37.4 C), temperature source Oral, weight 291 lb (132 kg), last menstrual period 10/07/2018.Body mass index is 42.97 kg/m.  General Appearance: Casual  Eye Contact:  Fair  Speech:  Clear and Coherent  Volume:  Normal  Mood:  Irritable  Affect:  Congruent  Thought Process:  Goal Directed  and Descriptions of Associations: Intact  Orientation:  Full (Time, Place, and Person)  Thought Content: Logical   Suicidal Thoughts:  No  Homicidal Thoughts:  No  Memory:  Immediate;   Fair Recent;   Fair Remote;   Fair  Judgement:  Fair  Insight:  Fair  Psychomotor Activity:  Normal  Concentration:  Concentration: Fair and Attention Span: Fair  Recall:  [employer] of Knowledge: Fair  Language: Fair  Akathisia:  No  Handed:  Right  AIMS (if indicated): denies rigidity,stiffness  Assets:  Communication Skills Desire for Improvement Social Support  ADL's:  Intact  Cognition: WNL  Sleep:  Fair   Screenings: AUDIT     [facility] Visit from 10/28/2018 in [facility] Office Visit from 07/28/2018 in [facility]  Alcohol Use Disorder Identification Test Final Score (AUDIT)  3  4    PHQ2-9     Office Visit from 10/28/2018 in [facility] Office Visit from 07/28/2018 in [facility] Office Visit from 04/08/2018 in [facility] Office Visit from 02/11/2018 in [facility] Office Visit from 10/13/2017 in [facility]  PHQ-2 Total Score  0  0  0  5  0  PHQ-9 Total Score  6  1  -  12  -       Assessment and Plan: Kierstin is a 42 year old Caucasian female who has a history of depression, anxiety, hypertension,  angioedema, presented to the clinic today for a follow-up visit.  Patient is biologically predisposed given her family history of mental health problems.  Patient reports her depressive symptoms as improved however continues to struggle with some irritability and agitation.  Patient discontinued Zoloft due to possible side effects of hair loss.  Discussed the following medication changes with patient.  She currently denies any suicidality.  Plan MDD Reduce Wellbutrin XL to 300 mg p.o. Daily PHQ 9 - 6  For GAD GAD 7 equals 7 She is currently not on any medications.  She discontinued the Zoloft due to side effects.  We will closely monitor her.  Follow-up in clinic in 6 weeks or sooner if needed  More than 50 % of the time was spent for psychoeducation and supportive psychotherapy and care coordination.  This note was generated in part or whole with voice recognition software. Voice recognition is usually quite accurate but there are transcription errors that can and very often do occur. I apologize for any typographical errors that were not detected and corrected.       Ursula Alert, MD 11/01/2018, 12:56 PM

## 2018-11-01 NOTE — Telephone Encounter (Signed)
I have already sent a 90 days to her [facility] today.

## 2018-11-02 NOTE — Telephone Encounter (Signed)
Are you sure that it went threw it shows on my is as "No Print"

## 2018-11-02 NOTE — Telephone Encounter (Signed)
On 10/28/2018 - 90 days supply of Wellbutrin 300 mg was sent to her pharmacy. I do not want to duplicate that again. She is now only on 300 mg and not on 450 mg.

## 2018-11-02 NOTE — Telephone Encounter (Signed)
received another request.  also are you sure it went threw.because it is showing on my end as No Print  ?

## 2018-11-03 ENCOUNTER — Ambulatory Visit (INDEPENDENT_AMBULATORY_CARE_PROVIDER_SITE_OTHER): Payer: 59 | Admitting: Internal Medicine

## 2018-11-03 ENCOUNTER — Encounter: Payer: Self-pay | Admitting: Internal Medicine

## 2018-11-03 VITALS — BP 128/84 | HR 96 | Resp 16 | Ht 69.0 in | Wt 293.0 lb

## 2018-11-03 DIAGNOSIS — G4719 Other hypersomnia: Secondary | ICD-10-CM | POA: Diagnosis not present

## 2018-11-03 DIAGNOSIS — I10 Essential (primary) hypertension: Secondary | ICD-10-CM | POA: Diagnosis not present

## 2018-11-03 NOTE — Patient Instructions (Addendum)
Will send for sleep study.    Sleep Apnea    Sleep apnea is disorder that affects a person's sleep. A person with sleep apnea has abnormal pauses in their breathing when they sleep. It is hard for them to get a good sleep. This makes a person tired during the day. It also can lead to other physical problems. There are three types of sleep apnea. One type is when breathing stops for a short time because your airway is blocked (obstructive sleep apnea). Another type is when the brain sometimes fails to give the normal signal to breathe to the muscles that control your breathing (central sleep apnea). The third type is a combination of the other two types.  HOME CARE   Take all medicine as told by your doctor.  Avoid alcohol, calming medicines (sedatives), and depressant drugs.  Try to lose weight if you are overweight. Talk to your doctor about a healthy weight goal.  Your doctor may have you use a device that helps to open your airway. It can help you get the air that you need. It is called a positive airway pressure (PAP) device.   MAKE SURE YOU:   Understand these instructions.  Will watch your condition.  Will get help right away if you are not doing well or get worse.  It may take approximately 1 month for you to get used to wearing her CPAP every night.  Be sure to work with your machine to get used to it, be patient, it may take time!  If you have trouble tolerating CPAP DO NOT RETURN YOUR MACHINE; Contact our office to see if we can help you tolerate the CPAP better first!

## 2018-11-03 NOTE — Progress Notes (Signed)
[name]      Assessment and Plan:  Excessive daytime sleepiness. - Symptoms and signs of obstructive sleep apnea. - We will send for sleep study, start CPAP as indicated.  Bruxism. - Patient currently wears a mouthguard for teeth grinding. - Continue mouthguard, presence of mouthguard may make a mandibular advancement device not possible.  Essential hypertension. - OSA can contribute to hypertension, therefore treatment of OSA is important part of management. - Patient is on Coreg 12.5 twice daily, which may be contributing to daytime sleepiness.  Obesity. - BMI 43. - Weight loss may be beneficial.  Orders Placed This Encounter  Procedures  . Home sleep test   Return in about 3 months (around 02/03/2019).   Date: 11/03/2018  MRN# [id number] Natalie Petersen 12-20-1976    Natalie Petersen is a 42 y.o. old female seen in consultation for chief complaint of:    Chief Complaint  Patient presents with  . Consult    Referred by Dr. Sanda Klein for eval of OSA;  . Snoring  . excessive daytime fatigue    HPI:  Patient presents today with symptoms of excessive daytime sleepiness.  She usually goes to bed between 10 and 10:30 PM.  She falls asleep within 30 minutes.  She gets out of bed at 6:30 AM.  She wakes up feeling tired and unrefreshed even after 8 to 9 hours of sleep. She has morning headaches. She has gained 20 to 30 pounds in the last 2 years. She has bruxism and wears a mouthguard.  Lower front 4 teeth. Her CBC shows elevated RBC and Hb.  Denies sleep paralysis, no sleep walking, no cataplexy.   Denies jaw pain, no TMJ.   PMHX:   Past Medical History:  Diagnosis Date  . Allergy    seasonal  . Anxiety   . Depression   . Menopausal symptoms   . Metrorrhagia   . Obesity, Class III, BMI 40-49.9 (morbid obesity) ([place]) 07/16/2015  . Vitamin D deficiency    Surgical Hx:  Past Surgical History:  Procedure Laterality Date  . BRAIN SURGERY       Family Hx:  Family History  Problem Relation Age of Onset  . Heart murmur Mother   . Atrial fibrillation Mother   . Arthritis Father        RA  . Hyperlipidemia Father   . Cancer Maternal Aunt        ovarian and uterine   . Arthritis Maternal Grandmother   . Heart attack Maternal Grandfather   . Stroke Paternal Grandmother   . Arthritis Paternal Grandmother   . Breast cancer Paternal Grandmother   . Stroke Paternal Uncle    Social Hx:   Social History   Tobacco Use  . Smoking status: Never Smoker  . Smokeless tobacco: Never Used  Substance Use Topics  . Alcohol use: Yes    Alcohol/week: 2.0 standard drinks    Types: 2 Glasses of wine per week    Frequency: Never  . Drug use: No   Medication:    Current Outpatient Medications:  .  amLODipine (NORVASC) 10 MG tablet, Take 0.5 tablets (5 mg total) by mouth daily., Disp: , Rfl:  .  buPROPion (WELLBUTRIN XL) 300 MG 24 hr tablet, Take 1 tablet (300 mg total) by mouth daily. Pt has supplies, Disp: 90 tablet, Rfl: 0 .  carvedilol (COREG) 12.5 MG tablet, Take 1 tablet (12.5 mg total) by mouth 2 (two) times daily with  a meal., Disp: 60 tablet, Rfl: 3 .  EPINEPHrine 0.3 mg/0.3 mL IJ SOAJ injection, Inject 0.3 mg into the muscle once. , Disp: , Rfl:  .  hydrochlorothiazide (HYDRODIURIL) 25 MG tablet, Take 1 tablet (25 mg total) by mouth daily., Disp: 90 tablet, Rfl: 3 .  Minoxidil (ROGAINE WOMENS) 5 % FOAM, Apply topically daily., Disp: , Rfl:    Allergies:  Ace inhibitors; Lisinopril; Amoxicillin; and Benadryl [diphenhydramine hcl (sleep)]  Review of Systems: Gen:  Denies  fever, sweats, chills HEENT: Denies blurred vision, double vision. bleeds, sore throat Cvc:  No dizziness, chest pain. Resp:   Denies cough or sputum production, shortness of breath Gi: Denies swallowing difficulty, stomach pain. Gu:  Denies bladder incontinence, burning urine Ext:   No Joint pain, stiffness. Skin: No skin rash,  hives  Endoc:  No  polyuria, polydipsia. Psych: No depression, insomnia. Other:  All other systems were reviewed with the patient and were negative other that what is mentioned in the HPI.   Physical Examination:   VS: BP 128/84 (BP Location: Left Arm, Cuff Size: Large)   Pulse 96   Resp 16   Ht 5' 9"  (1.753 m)   Wt 293 lb (132.9 kg)   LMP 10/07/2018   SpO2 99%   BMI 43.27 kg/m   General Appearance: No distress  Neuro:without focal findings,  speech normal,  HEENT: PERRLA, EOM intact.  Mallampati 2. Pulmonary: normal breath sounds, No wheezing.  CardiovascularNormal S1,S2.  No m/r/g.   Abdomen: Benign, Soft, non-tender. Renal:  No costovertebral tenderness  GU:  No performed at this time. Endoc: No evident thyromegaly, no signs of acromegaly. Skin:   warm, no rashes, no ecchymosis  Extremities: normal, no cyanosis, clubbing.  Other findings:    LABORATORY PANEL:   CBC Recent Labs  Lab 10/28/18 0839  WBC 6.5  HGB 15.8*  HCT 46.1*  PLT 351   ------------------------------------------------------------------------------------------------------------------  Chemistries  Recent Labs  Lab 10/28/18 0839  NA 139  K 4.0  CL 100  CO2 28  GLUCOSE 107*  BUN 16  CREATININE 0.80  CALCIUM 9.7  AST 17  ALT 26  BILITOT 0.6   ------------------------------------------------------------------------------------------------------------------  Cardiac Enzymes No results for input(s): TROPONINI in the last 168 hours. ------------------------------------------------------------  RADIOLOGY:  No results found.     Thank  you for the consultation and for allowing [facility] to assist in the care of your patient. Our recommendations are noted above.  Please contact us if we can be of further service.   Marda Stalker, M.D., F.C.C.P.  Board Certified in Internal Medicine, Pulmonary Medicine, [place], and Sleep Medicine.  [facility] Office Number: [phone]   11/03/2018

## 2018-11-04 ENCOUNTER — Other Ambulatory Visit: Payer: Self-pay | Admitting: Family Medicine

## 2018-11-04 ENCOUNTER — Ambulatory Visit: Payer: Self-pay

## 2018-11-04 VITALS — BP 116/80 | HR 68

## 2018-11-04 DIAGNOSIS — I10 Essential (primary) hypertension: Secondary | ICD-10-CM

## 2018-11-04 MED ORDER — VITAMIN D (ERGOCALCIFEROL) 1.25 MG (50000 UNIT) PO CAPS: 50000.0000 [IU] | ORAL_CAPSULE | ORAL | 1 refills | Status: DC

## 2018-11-04 NOTE — Progress Notes (Signed)
Start Rx vit D weekly x 8 weeks, then OTC 256 700 [id number] daily Start vit B12 250 to 500 mcg daily

## 2018-11-04 NOTE — Progress Notes (Signed)
Patient here for blood pressure check.  She is currently on Coreg 12.15m bid, Amlodipine 533mand HCTZ 2542m Patient denies any side effects and blood pressure today is 116/80 and pulse 68.  Patient was let go but told if any changes need to be made we will call.

## 2018-11-11 DIAGNOSIS — G4733 Obstructive sleep apnea (adult) (pediatric): Secondary | ICD-10-CM | POA: Diagnosis not present

## 2018-11-16 DIAGNOSIS — G4733 Obstructive sleep apnea (adult) (pediatric): Secondary | ICD-10-CM | POA: Diagnosis not present

## 2018-11-17 ENCOUNTER — Telehealth: Payer: Self-pay | Admitting: *Deleted

## 2018-11-17 DIAGNOSIS — G4733 Obstructive sleep apnea (adult) (pediatric): Secondary | ICD-10-CM

## 2018-11-17 NOTE — Telephone Encounter (Signed)
Pt contacted re: result HST Orders placed. Nothing further needed.

## 2018-11-23 ENCOUNTER — Other Ambulatory Visit: Payer: Self-pay | Admitting: *Deleted

## 2018-11-23 DIAGNOSIS — G4719 Other hypersomnia: Secondary | ICD-10-CM

## 2018-12-02 DIAGNOSIS — G4733 Obstructive sleep apnea (adult) (pediatric): Secondary | ICD-10-CM | POA: Diagnosis not present

## 2018-12-06 DIAGNOSIS — G4733 Obstructive sleep apnea (adult) (pediatric): Secondary | ICD-10-CM | POA: Diagnosis not present

## 2018-12-13 ENCOUNTER — Other Ambulatory Visit: Payer: Self-pay

## 2018-12-13 ENCOUNTER — Encounter: Payer: Self-pay | Admitting: Psychiatry

## 2018-12-13 ENCOUNTER — Ambulatory Visit (INDEPENDENT_AMBULATORY_CARE_PROVIDER_SITE_OTHER): Payer: 59 | Admitting: Psychiatry

## 2018-12-13 VITALS — BP 144/93 | HR 81 | Temp 98.9°F | Wt 298.0 lb

## 2018-12-13 DIAGNOSIS — F411 Generalized anxiety disorder: Secondary | ICD-10-CM

## 2018-12-13 DIAGNOSIS — F3341 Major depressive disorder, recurrent, in partial remission: Secondary | ICD-10-CM

## 2018-12-13 MED ORDER — BUPROPION HCL ER (XL) 300 MG PO TB24: 300.0000 mg | ORAL_TABLET | Freq: Every day | ORAL | 0 refills | Status: DC

## 2018-12-13 NOTE — Progress Notes (Signed)
Natalie Lake MD OP Progress Note  12/13/2018 5:17 PM Natalie Petersen  MRN:  [id number]  Chief Complaint: ' I am here for follow up.' Chief Complaint    Follow-up; Medication Refill     HPI: Natalie Petersen is a 42 yr old CF, married , has a hx of depression, anxiety, angioedema, lives in [place], presented to the clinic today for a follow-up visit.  Patient today reports she continues to take the reduced dosage of Wellbutrin 300 mg.  She reports her mood symptoms may have improved compared to her last visit.  She reports sleep is improved now and that could have contributed to improving her mood.  Patient reports her primary medical doctor had sent her for a sleep study and she was diagnosed with sleep apnea.  She reports she started using her CPAP a week ago.  That has helped her sleep to get better.  She reports since her sleep is better her tiredness during the day and her irritability has improved.  Patient denies any suicidality.  Patient denies any homicidality or perceptual disturbances.  Patient looks forward to Christmas holidays and plans to spend it with her family. Visit Diagnosis:    ICD-10-CM   1. MDD (major depressive disorder), recurrent, in partial remission (HCC) F33.41 buPROPion (WELLBUTRIN XL) 300 MG 24 hr tablet  2. GAD (generalized anxiety disorder) F41.1    improved    Past Psychiatric History: I have reviewed past psychiatric history from my progress note on 04/05/2018  Past Medical History:  Past Medical History:  Diagnosis Date  . Allergy    seasonal  . Anxiety   . Depression   . Menopausal symptoms   . Metrorrhagia   . Obesity, Class III, BMI 40-49.9 (morbid obesity) ([place]) 07/16/2015  . Vitamin D deficiency     Past Surgical History:  Procedure Laterality Date  . BRAIN SURGERY      Family Psychiatric History: I have reviewed family psychiatric history from my progress note on 04/05/2018.  Family History:  Family History  Problem Relation Age of Onset  . Heart  murmur Mother   . Atrial fibrillation Mother   . Arthritis Father        RA  . Hyperlipidemia Father   . Cancer Maternal Aunt        ovarian and uterine   . Arthritis Maternal Grandmother   . Heart attack Maternal Grandfather   . Stroke Paternal Grandmother   . Arthritis Paternal Grandmother   . Breast cancer Paternal Grandmother   . Stroke Paternal Uncle     Social History: Have reviewed social history from my progress note on 04/05/2018 Social History   Socioeconomic History  . Marital status: Married    Spouse name: Maximo Donaire  . Number of children: 1  . Years of education: Not on file  . Highest education level: Master's degree (e.g., MA, MS, MEng, MEd, MSW, MBA)  Occupational History    Comment: full time  Social Needs  . Financial resource strain: Not hard at all  . Food insecurity:    Worry: Never true    Inability: Never true  . Transportation needs:    Medical: No    Non-medical: No  Tobacco Use  . Smoking status: Never Smoker  . Smokeless tobacco: Never Used  Substance and Sexual Activity  . Alcohol use: Yes    Alcohol/week: 2.0 standard drinks    Types: 2 Glasses of wine per week    Frequency: Never  . Drug use:  No  . Sexual activity: Yes    Partners: Male    Birth control/protection: Pill  Lifestyle  . Physical activity:    Days per week: 3 days    Minutes per session: 20 min  . Stress: To some extent  Relationships  . Social connections:    Talks on phone: More than three times a week    Gets together: Once a week    Attends religious service: Never    Active member of club or organization: No    Attends meetings of clubs or organizations: Never    Relationship status: Married  Other Topics Concern  . Not on file  Social History Narrative  . Not on file    Allergies:  Allergies  Allergen Reactions  . Ace Inhibitors Swelling  . Lisinopril Swelling  . Amoxicillin Hives and Swelling  . Benadryl [Diphenhydramine Hcl (Sleep)] Hives and  Swelling    Metabolic Disorder Labs: Lab Results  Component Value Date   HGBA1C 5.5 10/28/2018   MPG 111 10/28/2018   No results found for: PROLACTIN Lab Results  Component Value Date   CHOL 199 02/11/2018   TRIG 121 02/11/2018   HDL 71 02/11/2018   CHOLHDL 2.7 10/13/2017   VLDL 20 09/09/2016   LDLCALC 104 (H) 02/11/2018   LDLCALC 127 (H) 10/13/2017   Lab Results  Component Value Date   TSH 3.81 10/28/2018   TSH 3.640 02/11/2018    Therapeutic Level Labs: No results found for: LITHIUM No results found for: VALPROATE No components found for:  CBMZ  Current Medications: Current Outpatient Medications  Medication Sig Dispense Refill  . amLODipine (NORVASC) 10 MG tablet Take 0.5 tablets (5 mg total) by mouth daily.    Marland Kitchen buPROPion (WELLBUTRIN XL) 300 MG 24 hr tablet Take 1 tablet (300 mg total) by mouth daily. 90 tablet 0  . carvedilol (COREG) 12.5 MG tablet Take 1 tablet (12.5 mg total) by mouth 2 (two) times daily with a meal. 60 tablet 3  . EPINEPHrine 0.3 mg/0.3 mL IJ SOAJ injection Inject 0.3 mg into the muscle once.     . hydrochlorothiazide (HYDRODIURIL) 25 MG tablet Take 1 tablet (25 mg total) by mouth daily. 90 tablet 3  . Minoxidil (ROGAINE WOMENS) 5 % FOAM Apply topically daily.    . Vitamin D, Ergocalciferol, (DRISDOL) 1.25 MG (50000 UT) CAPS capsule Take 1 capsule (50,000 Units total) by mouth every 7 (seven) days. 4 capsule 1  . buPROPion (WELLBUTRIN XL) 300 MG 24 hr tablet Take 1 tablet (300 mg total) by mouth daily. 15 tablet 0   No current facility-administered medications for this visit.      Musculoskeletal: Strength & Muscle Tone: within normal limits Gait & Station: normal Patient leans: N/A  Psychiatric Specialty Exam: Review of Systems  Psychiatric/Behavioral: Negative for depression. The patient is not nervous/anxious.   All other systems reviewed and are negative.   Blood pressure (!) 144/93, pulse 81, temperature 98.9 F (37.2 C),  temperature source Oral, weight 298 lb (135.2 kg).Body mass index is 44.01 kg/m.  General Appearance: Casual  Eye Contact:  Fair  Speech:  Clear and Coherent  Volume:  Normal  Mood:  Euthymic  Affect:  Congruent  Thought Process:  Goal Directed and Descriptions of Associations: Intact  Orientation:  Full (Time, Place, and Person)  Thought Content: Logical   Suicidal Thoughts:  No  Homicidal Thoughts:  No  Memory:  Immediate;   Fair Recent;   Fair Remote;  Fair  Judgement:  Fair  Insight:  Fair  Psychomotor Activity:  Normal  Concentration:  Concentration: Fair and Attention Span: Fair  Recall:  [employer] of Knowledge: Fair  Language: Fair  Akathisia:  No  Handed:  Right  AIMS (if indicated): denies tremors, rigidity,stiffness  Assets:  Communication Skills Desire for Improvement Social Support  ADL's:  Intact  Cognition: WNL  Sleep:  improving   Screenings: AUDIT     [facility] Visit from 10/28/2018 in [facility] Office Visit from 07/28/2018 in [facility]  Alcohol Use Disorder Identification Test Final Score (AUDIT)  3  4    PHQ2-9     Office Visit from 10/28/2018 in [facility] Office Visit from 07/28/2018 in [facility] Office Visit from 04/08/2018 in [facility] Office Visit from 02/11/2018 in [facility] Office Visit from 10/13/2017 in [facility]  PHQ-2 Total Score  0  0  0  5  0  PHQ-9 Total Score  6  1  -  12  -       Assessment and Plan: Naija is a 42 year old Caucasian female who has a history of depression, anxiety, hypertension, angioedema, presented to the clinic today for a follow-up visit.  Patient is biologically predisposed given her family history of mental health problems.  Patient reports improvement in her mood symptoms.  Will continue plan as noted below.  Plan MDD Continue reduced dosage of Wellbutrin XL 300 mg p.o.  daily PHQ 9 equals 3  For GAD GAD 7 equals 1   She will continue to use CPAP as prescribed.  Follow-up in clinic in 3 months or sooner if needed.  More than 50 % of the time was spent for psychoeducation and supportive psychotherapy and care coordination.  This note was generated in part or whole with voice recognition software. Voice recognition is usually quite accurate but there are transcription errors that can and very often do occur. I apologize for any typographical errors that were not detected and corrected.      Ursula Alert, MD 12/13/2018, 5:17 PM

## 2018-12-27 ENCOUNTER — Encounter: Payer: Self-pay | Admitting: Family Medicine

## 2018-12-27 ENCOUNTER — Other Ambulatory Visit: Payer: Self-pay | Admitting: Family Medicine

## 2018-12-27 NOTE — Telephone Encounter (Signed)
Please resolve with pharmacy Refill of HCTZ from me should not be needed yet

## 2018-12-28 NOTE — Telephone Encounter (Signed)
Left detailed VM, CRM created.

## 2019-01-01 ENCOUNTER — Other Ambulatory Visit: Payer: Self-pay | Admitting: Family Medicine

## 2019-01-01 DIAGNOSIS — F3340 Major depressive disorder, recurrent, in remission, unspecified: Secondary | ICD-10-CM

## 2019-01-02 DIAGNOSIS — G4733 Obstructive sleep apnea (adult) (pediatric): Secondary | ICD-10-CM | POA: Diagnosis not present

## 2019-01-28 ENCOUNTER — Ambulatory Visit: Payer: Self-pay | Admitting: Family Medicine

## 2019-02-02 DIAGNOSIS — G4733 Obstructive sleep apnea (adult) (pediatric): Secondary | ICD-10-CM | POA: Diagnosis not present

## 2019-02-07 NOTE — Progress Notes (Deleted)
[facility] Consultation      Assessment and Plan:  Obstructive sleep apnea. - Symptoms and signs of obstructive sleep apnea. - We will send for sleep study, start CPAP as indicated.  Bruxism. - Patient currently wears a mouthguard for teeth grinding. - Continue mouthguard, presence of mouthguard may make a mandibular advancement device not possible.  Essential hypertension. - OSA can contribute to hypertension, therefore treatment of OSA is important part of management. - Patient is on Coreg 12.5 twice daily, which may be contributing to daytime sleepiness.  Obesity. - BMI 43. - Weight loss may be beneficial.  No orders of the defined types were placed in this encounter.  No follow-ups on file.   Date: 02/07/2019  MRN# [id number] Natalie Petersen 11-24-1976    Natalie Petersen is a 43 y.o. old female seen in consultation for chief complaint of:    No chief complaint on file.   HPI:  Patient presents today with symptoms of excessive daytime sleepiness.  She usually goes to bed between 10 and 10:30 PM.  She falls asleep within 30 minutes.  She gets out of bed at 6:30 AM.  She wakes up feeling tired and unrefreshed even after 8 to 9 hours of sleep. She has morning headaches. She has gained 20 to 30 pounds in the last 2 years. She has bruxism and wears a mouthguard.  Lower front 4 teeth. Her CBC shows elevated RBC and Hb.  Denies sleep paralysis, no sleep walking, no cataplexy.   Denies jaw pain, no TMJ.   **HST 11/11/2018>> mild OSA with AHI of 10.  Recommended auto CPAP with pressure range 5-15.  Medication:    Current Outpatient Medications:  .  amLODipine (NORVASC) 10 MG tablet, Take 0.5 tablets (5 mg total) by mouth daily., Disp: , Rfl:  .  buPROPion (WELLBUTRIN XL) 300 MG 24 hr tablet, Take 1 tablet (300 mg total) by mouth daily., Disp: 90 tablet, Rfl: 0 .  buPROPion (WELLBUTRIN XL) 300 MG 24 hr tablet, Take 1 tablet (300 mg total) by mouth daily.,  Disp: 15 tablet, Rfl: 0 .  carvedilol (COREG) 12.5 MG tablet, Take 1 tablet (12.5 mg total) by mouth 2 (two) times daily with a meal., Disp: 60 tablet, Rfl: 3 .  EPINEPHrine 0.3 mg/0.3 mL IJ SOAJ injection, Inject 0.3 mg into the muscle once. , Disp: , Rfl:  .  hydrochlorothiazide (HYDRODIURIL) 25 MG tablet, Take 1 tablet (25 mg total) by mouth daily., Disp: 90 tablet, Rfl: 3 .  Minoxidil (ROGAINE WOMENS) 5 % FOAM, Apply topically daily., Disp: , Rfl:  .  Vitamin D, Ergocalciferol, (DRISDOL) 1.25 MG (50000 UT) CAPS capsule, Take 1 capsule (50,000 Units total) by mouth every 7 (seven) days., Disp: 4 capsule, Rfl: 1   Allergies:  Ace inhibitors; Lisinopril; Amoxicillin; and Benadryl [diphenhydramine hcl (sleep)]      LABORATORY PANEL:   CBC No results for input(s): WBC, HGB, HCT, PLT in the last 168 hours. ------------------------------------------------------------------------------------------------------------------  Chemistries  No results for input(s): NA, K, CL, CO2, GLUCOSE, BUN, CREATININE, CALCIUM, MG, AST, ALT, ALKPHOS, BILITOT in the last 168 hours.  Invalid input(s): GFRCGP ------------------------------------------------------------------------------------------------------------------  Cardiac Enzymes No results for input(s): TROPONINI in the last 168 hours. ------------------------------------------------------------  RADIOLOGY:  No results found.     Thank  you for the consultation and for allowing [facility] to assist in the care of your patient. Our recommendations are noted above.  Please contact us if we  can be of further service.   Marda Stalker, M.D., F.C.C.P.  Board Certified in Internal Medicine, Pulmonary Medicine, [place], and Sleep Medicine.  [facility] Office Number: [phone]   02/07/2019

## 2019-02-09 ENCOUNTER — Telehealth: Payer: 59 | Admitting: Family

## 2019-02-09 ENCOUNTER — Ambulatory Visit: Payer: 59 | Admitting: Internal Medicine

## 2019-02-09 DIAGNOSIS — B9789 Other viral agents as the cause of diseases classified elsewhere: Secondary | ICD-10-CM | POA: Diagnosis not present

## 2019-02-09 DIAGNOSIS — J069 Acute upper respiratory infection, unspecified: Secondary | ICD-10-CM | POA: Diagnosis not present

## 2019-02-09 MED ORDER — BENZONATATE 100 MG PO CAPS: 100.0000 mg | ORAL_CAPSULE | Freq: Three times a day (TID) | ORAL | 0 refills | Status: DC | PRN

## 2019-02-09 MED ORDER — FLUTICASONE PROPIONATE 50 MCG/ACT NA SUSP: 2.0000 | Freq: Every day | NASAL | 6 refills | Status: DC

## 2019-02-09 NOTE — Progress Notes (Signed)
We are sorry you are not feeling well.  Here is how we plan to help!  Based on what you have shared with me, it looks like you may have a viral upper respiratory infection or a "common cold".  Colds are caused by a large number of viruses; however, rhinovirus is the most common cause.   Symptoms of the common cold vary from person to person, with common symptoms including sore throat, cough, and malaise.  A low-grade fever of 100.4 may present, but is often uncommon.  Symptoms vary however, and are closely related to a person's age or underlying illnesses.  The most common symptoms associated with the common cold are nasal discharge or congestion, cough, sneezing, headache and pressure in the ears and face.  Cold symptoms usually persist for about 3 to 10 days, but can last up to 2 weeks.  It is important to know that colds do not cause serious illness or complications in most cases.    The common cold is transmitted from person to person, with the most common method of transmission being a person's hands.  The virus is able to live on the skin and can infect other persons for up to 2 hours after direct contact.  Also, colds are transmitted when someone coughs or sneezes; thus, it is important to cover the mouth to reduce this risk.  To keep the spread of the common cold at [place], good hand hygiene is very important.  This is an infection that is most likely caused by a virus. There are no specific treatments for the common cold other than to help you with the symptoms until the infection runs its course.    For nasal congestion, you may use an oral decongestants such as Mucinex D or if you have glaucoma or high blood pressure use plain Mucinex.  Saline nasal spray or nasal drops can help and can safely be used as often as needed for congestion.  For your congestion, I have prescribed Fluticasone nasal spray one spray in each nostril twice a day.   Approximately 5 minutes spent reviewing and documenting  in his chart.   If you do not have a history of heart disease, hypertension, diabetes or thyroid disease, prostate/bladder issues or glaucoma, you may also use Sudafed to treat nasal congestion.  It is highly recommended that you consult with a pharmacist or your primary care physician to ensure this medication is safe for you to take.     If you have a cough, you may use cough suppressants such as Delsym and Robitussin.  If you have glaucoma or high blood pressure, you can also use Coricidin HBP.   For cough I have prescribed for you A prescription cough medication called Tessalon Perles 100 mg. You may take 1-2 capsules every 8 hours as needed for cough.  You can continue your Allegra.   If you have a sore or scratchy throat, use a saltwater gargle-  to  teaspoon of salt dissolved in a 4-ounce to 8-ounce glass of warm water.  Gargle the solution for approximately 15-30 seconds and then spit.  It is important not to swallow the solution.  You can also use throat lozenges/cough drops and Chloraseptic spray to help with throat pain or discomfort.  Warm or cold liquids can also be helpful in relieving throat pain.  For headache, pain or general discomfort, you can use Ibuprofen or Tylenol as directed.   Some authorities believe that zinc sprays or the use of  Echinacea may shorten the course of your symptoms.   HOME CARE . Only take medications as instructed by your medical team. . Be sure to drink plenty of fluids. Water is fine as well as fruit juices, sodas and electrolyte beverages. You may want to stay away from caffeine or alcohol. If you are nauseated, try taking small sips of liquids. How do you know if you are getting enough fluid? Your urine should be a pale yellow or almost colorless. . Get rest. . Taking a steamy shower or using a humidifier may help nasal congestion and ease sore throat pain. You can place a towel over your head and breathe in the steam from hot water coming from a  faucet. . Using a saline nasal spray works much the same way. . Cough drops, hard candies and sore throat lozenges may ease your cough. . Avoid close contacts especially the very young and the elderly . Cover your mouth if you cough or sneeze . Always remember to wash your hands.   GET HELP RIGHT AWAY IF: . You develop worsening fever. . If your symptoms do not improve within 10 days . You develop yellow or green discharge from your nose over 3 days. . You have coughing fits . You develop a severe head ache or visual changes. . You develop shortness of breath, difficulty breathing or start having chest pain . Your symptoms persist after you have completed your treatment plan  MAKE SURE YOU   Understand these instructions.  Will watch your condition.  Will get help right away if you are not doing well or get worse.  Your e-visit answers were reviewed by a board certified advanced clinical practitioner to complete your personal care plan. Depending upon the condition, your plan could have included both over the counter or prescription medications. Please review your pharmacy choice. If there is a problem, you may call our nursing hot line at and have the prescription routed to another pharmacy. Your safety is important to [place]. If you have drug allergies check your prescription carefully.   You can use MyChart to ask questions about today's visit, request a non-urgent call back, or ask for a work or school excuse for 24 hours related to this e-Visit. If it has been greater than 24 hours you will need to follow up with your provider, or enter a new e-Visit to address those concerns. You will get an e-mail in the next two days asking about your experience.  I hope that your e-visit has been valuable and will speed your recovery. Thank you for using e-visits.

## 2019-02-14 ENCOUNTER — Other Ambulatory Visit: Payer: Self-pay | Admitting: Family Medicine

## 2019-02-14 NOTE — Progress Notes (Signed)
[facility] Consultation      Assessment and Plan:  Obstructive sleep apnea. - Symptoms and signs of obstructive sleep apnea. - Doing well with CPAP, encouraged to continue use of CPAP every night.  Bruxism. - Patient currently wears a mouthguard for teeth grinding. - Continue mouthguard, presence of mouthguard may make a mandibular advancement device not possible.  Essential hypertension. - OSA can contribute to hypertension, therefore treatment of OSA is important part of management. - Patient is on Coreg 12.5 twice daily, which may be contributing to daytime sleepiness.  Obesity. - BMI 43. - Weight loss may be beneficial.   Return in about 1 year (around 02/16/2020).   Date: 02/14/2019  MRN# [id number] Natalie Petersen February 18, 1976    Natalie Petersen is a 43 y.o. old female seen in consultation for chief complaint of:    Chief Complaint  Patient presents with  . Follow-up    States her cpap has been working great.     HPI:  She has been using her cpap and notes that she is more awake during the day and no longer snoring.  She missed a few days last week because of a sick child.   She has bruxism and wears a mouthguard.  Lower front 4 teeth. She continues to use her mouthguard.  Her CBC shows elevated RBC and Hb.  Denies sleep paralysis, no sleep walking, no cataplexy.   Denies jaw pain, no TMJ.   **CPAP download 01/15/2019-02/13/2019>> uses greater than 4 hours is 20/30 days.  Average usage on days used 5 hours 4 minutes.  Pressure ranges 5-15.  Median pressure 7, 95th percentile pressure 10, maximum pressure 11.  Leaks are minimal.  Residual AHI 0.1.  Overall this shows inadequate compliance with excellent control of obstructive sleep apnea when used. **HST 11/11/2018>> mild OSA with AHI of 10.  Recommended auto CPAP with pressure range 5-15.  Medication:    Current Outpatient Medications:  .  amLODipine (NORVASC) 10 MG tablet, Take 0.5 tablets (5 mg  total) by mouth daily., Disp: , Rfl:  .  benzonatate (TESSALON PERLES) 100 MG capsule, Take 1 capsule (100 mg total) by mouth 3 (three) times daily as needed., Disp: 20 capsule, Rfl: 0 .  buPROPion (WELLBUTRIN XL) 300 MG 24 hr tablet, Take 1 tablet (300 mg total) by mouth daily., Disp: 90 tablet, Rfl: 0 .  buPROPion (WELLBUTRIN XL) 300 MG 24 hr tablet, Take 1 tablet (300 mg total) by mouth daily., Disp: 15 tablet, Rfl: 0 .  carvedilol (COREG) 12.5 MG tablet, Take 1 tablet (12.5 mg total) by mouth 2 (two) times daily with a meal., Disp: 60 tablet, Rfl: 3 .  EPINEPHrine 0.3 mg/0.3 mL IJ SOAJ injection, Inject 0.3 mg into the muscle once. , Disp: , Rfl:  .  fluticasone (FLONASE) 50 MCG/ACT nasal spray, Place 2 sprays into both nostrils daily., Disp: 16 g, Rfl: 6 .  hydrochlorothiazide (HYDRODIURIL) 25 MG tablet, Take 1 tablet (25 mg total) by mouth daily., Disp: 90 tablet, Rfl: 3 .  Minoxidil (ROGAINE WOMENS) 5 % FOAM, Apply topically daily., Disp: , Rfl:  .  Vitamin D, Ergocalciferol, (DRISDOL) 1.25 MG (50000 UT) CAPS capsule, Take 1 capsule (50,000 Units total) by mouth every 7 (seven) days., Disp: 4 capsule, Rfl: 1   Allergies:  Ace inhibitors; Lisinopril; Amoxicillin; and Benadryl [diphenhydramine hcl (sleep)]  Review of Systems:  Constitutional: Feels well. Cardiovascular: Denies chest pain, exertional chest pain.  Pulmonary: Denies hemoptysis, pleuritic chest  pain.   The remainder of systems were reviewed and were found to be negative other than what is documented in the HPI.    Physical Examination:   VS: BP 138/88   Pulse 75   Ht 5' 9.5" (1.765 m)   Wt (!) 300 lb 9.6 oz (136.4 kg)   SpO2 100%   BMI 43.75 kg/m   General Appearance: No distress  Neuro:without focal findings, mental status, speech normal, alert and oriented HEENT: PERRLA, EOM intact Pulmonary: No wheezing, No rales  CardiovascularNormal S1,S2.  No m/r/g.  Abdomen: Benign, Soft, non-tender, No masses Renal:  No  costovertebral tenderness  GU:  No performed at this time. Endoc: No evident thyromegaly, no signs of acromegaly or Cushing features Skin:   warm, no rashes, no ecchymosis  Extremities: normal, no cyanosis, clubbing.      LABORATORY PANEL:   CBC No results for input(s): WBC, HGB, HCT, PLT in the last 168 hours. ------------------------------------------------------------------------------------------------------------------  Chemistries  No results for input(s): NA, K, CL, CO2, GLUCOSE, BUN, CREATININE, CALCIUM, MG, AST, ALT, ALKPHOS, BILITOT in the last 168 hours.  Invalid input(s): GFRCGP ------------------------------------------------------------------------------------------------------------------  Cardiac Enzymes No results for input(s): TROPONINI in the last 168 hours. ------------------------------------------------------------  RADIOLOGY:  No results found.     Thank  you for the consultation and for allowing [facility] to assist in the care of your patient. Our recommendations are noted above.  Please contact us if we can be of further service.   Marda Stalker, M.D., F.C.C.P.  Board Certified in Internal Medicine, Pulmonary Medicine, [place], and Sleep Medicine.  [facility] Office Number: [phone]   02/14/2019

## 2019-02-15 ENCOUNTER — Encounter: Payer: Self-pay | Admitting: Internal Medicine

## 2019-02-15 ENCOUNTER — Ambulatory Visit (INDEPENDENT_AMBULATORY_CARE_PROVIDER_SITE_OTHER): Payer: 59 | Admitting: Internal Medicine

## 2019-02-15 VITALS — BP 138/88 | HR 75 | Ht 69.5 in | Wt 300.6 lb

## 2019-02-15 DIAGNOSIS — G4733 Obstructive sleep apnea (adult) (pediatric): Secondary | ICD-10-CM | POA: Diagnosis not present

## 2019-02-15 NOTE — Patient Instructions (Signed)
Continue using cpap every night for the whole night.

## 2019-02-17 ENCOUNTER — Other Ambulatory Visit: Payer: Self-pay | Admitting: Family Medicine

## 2019-02-17 NOTE — Telephone Encounter (Signed)
Lab Results  Component Value Date   CREATININE 0.80 10/28/2018   Lab Results  Component Value Date   K 4.0 10/28/2018

## 2019-03-01 ENCOUNTER — Encounter: Payer: Self-pay | Admitting: Family Medicine

## 2019-03-02 ENCOUNTER — Other Ambulatory Visit: Payer: Self-pay | Admitting: Family Medicine

## 2019-03-02 MED ORDER — CARVEDILOL 25 MG PO TABS: 25.0000 mg | ORAL_TABLET | Freq: Two times a day (BID) | ORAL | 0 refills | Status: DC

## 2019-03-02 NOTE — Addendum Note (Signed)
Addended by: Lillee Mooneyhan, Satira Anis on: 03/02/2019 07:54 AM   Modules accepted: Orders

## 2019-03-03 DIAGNOSIS — G4733 Obstructive sleep apnea (adult) (pediatric): Secondary | ICD-10-CM | POA: Diagnosis not present

## 2019-03-14 ENCOUNTER — Ambulatory Visit: Payer: 59 | Admitting: Psychiatry

## 2019-03-15 ENCOUNTER — Other Ambulatory Visit: Payer: Self-pay | Admitting: Psychiatry

## 2019-03-15 DIAGNOSIS — F3341 Major depressive disorder, recurrent, in partial remission: Secondary | ICD-10-CM

## 2019-03-23 ENCOUNTER — Other Ambulatory Visit: Payer: Self-pay | Admitting: Family Medicine

## 2019-04-03 DIAGNOSIS — G4733 Obstructive sleep apnea (adult) (pediatric): Secondary | ICD-10-CM | POA: Diagnosis not present

## 2019-04-12 ENCOUNTER — Encounter: Payer: Self-pay | Admitting: Psychiatry

## 2019-04-12 ENCOUNTER — Ambulatory Visit (INDEPENDENT_AMBULATORY_CARE_PROVIDER_SITE_OTHER): Payer: Self-pay | Admitting: Psychiatry

## 2019-04-12 ENCOUNTER — Other Ambulatory Visit: Payer: Self-pay

## 2019-04-12 DIAGNOSIS — F3341 Major depressive disorder, recurrent, in partial remission: Secondary | ICD-10-CM

## 2019-04-12 DIAGNOSIS — F411 Generalized anxiety disorder: Secondary | ICD-10-CM

## 2019-04-12 NOTE — Progress Notes (Signed)
Virtual Visit via Video Note  I connected with Natalie Petersen on 04/12/19 at 10:15 AM EDT by a video enabled telemedicine application and verified that I am speaking with the correct person using two identifiers.   I discussed the limitations of evaluation and management by telemedicine and the availability of in person appointments. The patient expressed understanding and agreed to proceed.  I discussed the assessment and treatment plan with the patient. The patient was provided an opportunity to ask questions and all were answered. The patient agreed with the plan and demonstrated an understanding of the instructions.   The patient was advised to call back or seek an in-person evaluation if the symptoms worsen or if the condition fails to improve as anticipated.   [place] MD OP Progress Note  04/12/2019 2:36 PM Natalie Petersen  MRN:  [id number]  Chief Complaint:  Chief Complaint    Follow-up     HPI: Natalie Petersen is a 43 year old Caucasian female, married, has a history of depression, anxiety, angioedema, lives in [place] was evaluated by telemedicine today.  Patient today reports that she has been having some increased anxiety due to the current COVID-19 outbreak.  She reports she works from home and her work schedule has not changed.  She however reports her 72-year-old son currently is being homeschooled.  She reports he needs a lot of help with online classes all day.  Her husband has to work outside of home.  Hence he cannot support her son.  She reports she is having trouble handling her job related responsibilities as well as her son's.  This has been making her anxious.  Patient reports sleep is okay.  Patient does report some pain in her ear and also pressure in her head which she attributes to her CPAP mask.  She reports she has reached out to the sleep medicine provider for the same.  Patient denies any suicidality, homicidality or perceptual disturbances.  Patient advised to start  psychotherapy sessions, she however reports she does have therapy options with [facility] and will reach out to them.  Patient denies any other concerns today    Visit Diagnosis:    ICD-10-CM   1. MDD (major depressive disorder), recurrent, in partial remission ([place]) F33.41   2. GAD (generalized anxiety disorder) F41.1     Past Psychiatric History: Reviewed past psychiatric history from my progress note on 04/05/2018.   Past Medical History:  Past Medical History:  Diagnosis Date  . Allergy    seasonal  . Anxiety   . Depression   . Menopausal symptoms   . Metrorrhagia   . Obesity, Class III, BMI 40-49.9 (morbid obesity) ([place]) 07/16/2015  . Vitamin D deficiency     Past Surgical History:  Procedure Laterality Date  . BRAIN SURGERY      Family Psychiatric History: Reviewed family psychiatric history from my progress note on 04/05/2018.  Family History:  Family History  Problem Relation Age of Onset  . Heart murmur Mother   . Atrial fibrillation Mother   . Arthritis Father        RA  . Hyperlipidemia Father   . Cancer Maternal Aunt        ovarian and uterine   . Arthritis Maternal Grandmother   . Heart attack Maternal Grandfather   . Stroke Paternal Grandmother   . Arthritis Paternal Grandmother   . Breast cancer Paternal Grandmother   . Stroke Paternal Uncle     Social History: Reviewed social history from my  progress note on 04/05/2018. Social History   Socioeconomic History  . Marital status: Married    Spouse name: Maximo Donaire  . Number of children: 1  . Years of education: Not on file  . Highest education level: Master's degree (e.g., MA, MS, MEng, MEd, MSW, MBA)  Occupational History    Comment: full time  Social Needs  . Financial resource strain: Not hard at all  . Food insecurity:    Worry: Never true    Inability: Never true  . Transportation needs:    Medical: No    Non-medical: No  Tobacco Use  . Smoking status: Never Smoker  . Smokeless  tobacco: Never Used  Substance and Sexual Activity  . Alcohol use: Yes    Alcohol/week: 2.0 standard drinks    Types: 2 Glasses of wine per week    Frequency: Never  . Drug use: No  . Sexual activity: Yes    Partners: Male    Birth control/protection: Pill  Lifestyle  . Physical activity:    Days per week: 3 days    Minutes per session: 20 min  . Stress: To some extent  Relationships  . Social connections:    Talks on phone: More than three times a week    Gets together: Once a week    Attends religious service: Never    Active member of club or organization: No    Attends meetings of clubs or organizations: Never    Relationship status: Married  Other Topics Concern  . Not on file  Social History Narrative  . Not on file    Allergies:  Allergies  Allergen Reactions  . Ace Inhibitors Swelling  . Lisinopril Swelling  . Amoxicillin Hives and Swelling  . Benadryl [Diphenhydramine Hcl (Sleep)] Hives and Swelling    Metabolic Disorder Labs: Lab Results  Component Value Date   HGBA1C 5.5 10/28/2018   MPG 111 10/28/2018   No results found for: PROLACTIN Lab Results  Component Value Date   CHOL 199 02/11/2018   TRIG 121 02/11/2018   HDL 71 02/11/2018   CHOLHDL 2.7 10/13/2017   VLDL 20 09/09/2016   LDLCALC 104 (H) 02/11/2018   LDLCALC 127 (H) 10/13/2017   Lab Results  Component Value Date   TSH 3.81 10/28/2018   TSH 3.640 02/11/2018    Therapeutic Level Labs: No results found for: LITHIUM No results found for: VALPROATE No components found for:  CBMZ  Current Medications: Current Outpatient Medications  Medication Sig Dispense Refill  . amLODipine (NORVASC) 10 MG tablet Take 0.5 tablets (5 mg total) by mouth daily.    Marland Kitchen buPROPion (WELLBUTRIN XL) 300 MG 24 hr tablet TAKE 1 TABLET BY MOUTH  DAILY 90 tablet 0  . carvedilol (COREG) 25 MG tablet TAKE 1 TABLET(25 MG) BY MOUTH TWICE DAILY WITH A MEAL 180 tablet 0  . EPINEPHrine 0.3 mg/0.3 mL IJ SOAJ injection  Inject 0.3 mg into the muscle once.     . hydrochlorothiazide (HYDRODIURIL) 25 MG tablet TAKE 1 TABLET BY MOUTH  DAILY 90 tablet 0  . Minoxidil (ROGAINE WOMENS) 5 % FOAM Apply topically daily.     No current facility-administered medications for this visit.      Musculoskeletal: Strength & Muscle Tone: UTA Gait & Station: UTA Patient leans: N/A  Psychiatric Specialty Exam: Review of Systems  Psychiatric/Behavioral: The patient is nervous/anxious.   All other systems reviewed and are negative.   There were no vitals taken for this visit.There is  no height or weight on file to calculate BMI.  General Appearance: Casual  Eye Contact:  Fair  Speech:  Normal Rate  Volume:  Normal  Mood:  Anxious  Affect:  Congruent  Thought Process:  Goal Directed and Descriptions of Associations: Intact  Orientation:  Full (Time, Place, and Person)  Thought Content: Logical   Suicidal Thoughts:  No  Homicidal Thoughts:  No  Memory:  Immediate;   Fair Recent;   Fair Remote;   Fair  Judgement:  Fair  Insight:  Fair  Psychomotor Activity:  Normal  Concentration:  Concentration: Fair and Attention Span: Fair  Recall:  [employer] of Knowledge: Fair  Language: Fair  Akathisia:  No  Handed:  Right  AIMS (if indicated): UTA  Assets:  Communication Skills Desire for Improvement Housing Social Support  ADL's:  Intact  Cognition: WNL  Sleep:  Fair   Screenings: AUDIT     Office Visit from 10/28/2018 in [facility] Office Visit from 07/28/2018 in [facility]  Alcohol Use Disorder Identification Test Final Score (AUDIT)  3  4    PHQ2-9     Office Visit from 10/28/2018 in [facility] Office Visit from 07/28/2018 in [facility] Office Visit from 04/08/2018 in [facility] Office Visit from 02/11/2018 in [facility] Office Visit from 10/13/2017 in [facility]  PHQ-2  Total Score  0  0  0  5  0  PHQ-9 Total Score  6  1  -  12  -       Assessment and Plan: Natalie Petersen is a 43 year old Caucasian female, employed, married, has a history of MDD, anxiety, hypertension, OSA on CPAP, angioedema, was evaluated by telemedicine today.  Patient is biologically predisposed given her family history of mental health problems.  Patient reports psychosocial stressors of the current COVID-19 outbreak.  Patient will benefit from psychotherapy sessions due to her current situational stressors.  Plan as noted below.  Plan MDD- stable Wellbutrin XL 300 mg p.o. daily-reduced dosage.  For GAD- some worsening Patient does have some situational stressors-advised to start psychotherapy sessions.  She will reach out to [facility].  Follow-up in clinic in 1 to 2 months or sooner if needed.  I have spent atleast 15 minutes non face to face with patient today. More than 50 % of the time was spent for psychoeducation and supportive psychotherapy and care coordination.  This note was generated in part or whole with voice recognition software. Voice recognition is usually quite accurate but there are transcription errors that can and very often do occur. I apologize for any typographical errors that were not detected and corrected.          Ursula Alert, MD 04/12/2019, 2:36 PM

## 2019-04-12 NOTE — Progress Notes (Signed)
TC on  04-12-19 @ 8:43 spoke with patient reviewed patient allergies with no changes. Reviewed the medical and surgical hx with no changes.  Pt medications and pharmacy were reviewed and updated. No vitals taken because this is a phone consult.

## 2019-05-03 DIAGNOSIS — G4733 Obstructive sleep apnea (adult) (pediatric): Secondary | ICD-10-CM | POA: Diagnosis not present

## 2019-06-08 ENCOUNTER — Other Ambulatory Visit: Payer: Self-pay | Admitting: Family Medicine

## 2019-06-09 ENCOUNTER — Other Ambulatory Visit: Payer: Self-pay | Admitting: Psychiatry

## 2019-06-09 DIAGNOSIS — F3341 Major depressive disorder, recurrent, in partial remission: Secondary | ICD-10-CM

## 2019-06-14 ENCOUNTER — Ambulatory Visit: Payer: Self-pay | Admitting: Psychiatry

## 2019-06-28 ENCOUNTER — Ambulatory Visit (INDEPENDENT_AMBULATORY_CARE_PROVIDER_SITE_OTHER): Payer: Self-pay | Admitting: Psychiatry

## 2019-06-28 ENCOUNTER — Other Ambulatory Visit: Payer: Self-pay

## 2019-06-28 ENCOUNTER — Encounter: Payer: Self-pay | Admitting: Psychiatry

## 2019-06-28 DIAGNOSIS — F411 Generalized anxiety disorder: Secondary | ICD-10-CM | POA: Insufficient documentation

## 2019-06-28 DIAGNOSIS — F3341 Major depressive disorder, recurrent, in partial remission: Secondary | ICD-10-CM

## 2019-06-28 NOTE — Progress Notes (Signed)
Virtual Visit via Video Note  I connected with Natalie Petersen on 06/28/19 at  8:30 AM EDT by a video enabled telemedicine application and verified that I am speaking with the correct person using two identifiers.   I discussed the limitations of evaluation and management by telemedicine and the availability of in person appointments. The patient expressed understanding and agreed to proceed.   I discussed the assessment and treatment plan with the patient. The patient was provided an opportunity to ask questions and all were answered. The patient agreed with the plan and demonstrated an understanding of the instructions.   The patient was advised to call back or seek an in-person evaluation if the symptoms worsen or if the condition fails to improve as anticipated.   [place] MD OP Progress Note  06/28/2019 12:00 PM Natalie Petersen  MRN:  [id number]  Chief Complaint:  Chief Complaint    Follow-up     HPI: Natalie Petersen is a 43 year old Caucasian female, married, has a history of MDD, GAD, angioedema, lives in [place], was evaluated by telemedicine today.  Patient today reports since school has closed for her 68-year-old he feels much better.  Homework load is better now.  She reports her sister is here to visit from [place].  That also helps.  She does report some anxiety about current COVID-19 situation however she is elevated from social situations.  She reports sleep is good.  She denies any significant depressive symptoms.  Patient denies any suicidality, homicidality or perceptual disturbances.  Patient continues to wear CPAP.  She reports that helps.  Patient reports that she has been journaling,, has an agenda with her daily chores.  All this has helped her.  She has not EAP therapy yet     Visit Diagnosis:    ICD-10-CM   1. MDD (major depressive disorder), recurrent, in partial remission ([place])  F33.41   2. GAD (generalized anxiety disorder)  F41.1     Past Psychiatric History: I have  reviewed past psychiatric history from my progress note on 04/05/2018.  Past Medical History:  Past Medical History:  Diagnosis Date  . Allergy    seasonal  . Anxiety   . Depression   . Menopausal symptoms   . Metrorrhagia   . Obesity, Class III, BMI 40-49.9 (morbid obesity) ([place]) 07/16/2015  . Vitamin D deficiency     Past Surgical History:  Procedure Laterality Date  . BRAIN SURGERY      Family Psychiatric History: I have reviewed family psychiatric history from my progress note on 04/05/2018.  Family History:  Family History  Problem Relation Age of Onset  . Heart murmur Mother   . Atrial fibrillation Mother   . Arthritis Father        RA  . Hyperlipidemia Father   . Cancer Maternal Aunt        ovarian and uterine   . Arthritis Maternal Grandmother   . Heart attack Maternal Grandfather   . Stroke Paternal Grandmother   . Arthritis Paternal Grandmother   . Breast cancer Paternal Grandmother   . Stroke Paternal Uncle     Social History: I have reviewed social history from my progress note on 04/05/2018. Social History   Socioeconomic History  . Marital status: Married    Spouse name: Maximo Donaire  . Number of children: 1  . Years of education: Not on file  . Highest education level: Master's degree (e.g., MA, MS, MEng, MEd, MSW, MBA)  Occupational History  Comment: full time  Social Needs  . Financial resource strain: Not hard at all  . Food insecurity    Worry: Never true    Inability: Never true  . Transportation needs    Medical: No    Non-medical: No  Tobacco Use  . Smoking status: Never Smoker  . Smokeless tobacco: Never Used  Substance and Sexual Activity  . Alcohol use: Yes    Alcohol/week: 2.0 standard drinks    Types: 2 Glasses of wine per week    Frequency: Never  . Drug use: No  . Sexual activity: Yes    Partners: Male    Birth control/protection: Pill  Lifestyle  . Physical activity    Days per week: 3 days    Minutes per session:  20 min  . Stress: To some extent  Relationships  . Social connections    Talks on phone: More than three times a week    Gets together: Once a week    Attends religious service: Never    Active member of club or organization: No    Attends meetings of clubs or organizations: Never    Relationship status: Married  Other Topics Concern  . Not on file  Social History Narrative  . Not on file    Allergies:  Allergies  Allergen Reactions  . Ace Inhibitors Swelling  . Lisinopril Swelling  . Amoxicillin Hives and Swelling  . Benadryl [Diphenhydramine Hcl (Sleep)] Hives and Swelling    Metabolic Disorder Labs: Lab Results  Component Value Date   HGBA1C 5.5 10/28/2018   MPG 111 10/28/2018   No results found for: PROLACTIN Lab Results  Component Value Date   CHOL 199 02/11/2018   TRIG 121 02/11/2018   HDL 71 02/11/2018   CHOLHDL 2.7 10/13/2017   VLDL 20 09/09/2016   LDLCALC 104 (H) 02/11/2018   LDLCALC 127 (H) 10/13/2017   Lab Results  Component Value Date   TSH 3.81 10/28/2018   TSH 3.640 02/11/2018    Therapeutic Level Labs: No results found for: LITHIUM No results found for: VALPROATE No components found for:  CBMZ  Current Medications: Current Outpatient Medications  Medication Sig Dispense Refill  . amLODipine (NORVASC) 10 MG tablet TAKE 1 TABLET BY MOUTH  DAILY 90 tablet 0  . buPROPion (WELLBUTRIN XL) 300 MG 24 hr tablet TAKE 1 TABLET BY MOUTH  DAILY 90 tablet 0  . carvedilol (COREG) 12.5 MG tablet TK 1 T PO BID    . carvedilol (COREG) 25 MG tablet TAKE 1 TABLET(25 MG) BY MOUTH TWICE DAILY WITH A MEAL 180 tablet 0  . EPINEPHrine 0.3 mg/0.3 mL IJ SOAJ injection Inject 0.3 mg into the muscle once.     . hydrochlorothiazide (HYDRODIURIL) 25 MG tablet TAKE 1 TABLET BY MOUTH  DAILY 90 tablet 0  . Minoxidil (ROGAINE WOMENS) 5 % FOAM Apply topically daily.     No current facility-administered medications for this visit.      Musculoskeletal: Strength & Muscle  Tone: within normal limits Gait & Station: normal Patient leans: N/A  Psychiatric Specialty Exam: Review of Systems  Psychiatric/Behavioral: The patient is nervous/anxious.   All other systems reviewed and are negative.   There were no vitals taken for this visit.There is no height or weight on file to calculate BMI.  General Appearance: Casual  Eye Contact:  Fair  Speech:  Clear and Coherent  Volume:  Normal  Mood:  Anxious improving  Affect:  Appropriate  Thought Process:  Goal Directed and Descriptions of Associations: Intact  Orientation:  Full (Time, Place, and Person)  Thought Content: Logical   Suicidal Thoughts:  No  Homicidal Thoughts:  No  Memory:  Immediate;   Fair Recent;   Fair Remote;   Fair  Judgement:  Fair  Insight:  Fair  Psychomotor Activity:  Normal  Concentration:  Concentration: Fair and Attention Span: Fair  Recall:  [employer] of Knowledge: Fair  Language: Fair  Akathisia:  No  Handed:  Right  AIMS (if indicated): denies tremors, rigidity  Assets:  Communication Skills Desire for Improvement Social Support  ADL's:  Intact  Cognition: WNL  Sleep:  Fair   Screenings: AUDIT     Office Visit from 10/28/2018 in [facility] Office Visit from 07/28/2018 in [facility]  Alcohol Use Disorder Identification Test Final Score (AUDIT)  3  4    PHQ2-9     Office Visit from 10/28/2018 in [facility] Office Visit from 07/28/2018 in [facility] Office Visit from 04/08/2018 in [facility] Office Visit from 02/11/2018 in [place] Office Visit from 10/13/2017 in [facility]  PHQ-2 Total Score  0  0  0  5  0  PHQ-9 Total Score  6  1  -  12  -       Assessment and Plan: Natalie Petersen is a 43 year old Caucasian female, employed, married, has a history of MDD, anxiety, hypertension, OSA on CPAP, angioedema was evaluated by telemedicine  today.  Patient is biologically predisposed given her family history of mental health problems.  She also has psycho social service of current COVID-19 outbreak.  Patient however can progress.  Plan as noted below.  Plan MDD-stable Wellbutrin XL 300 mg p.o. daily-reduced dosage  GAD-improving She has started journaling and has support. She has been advised to start psychotherapy sessions as needed.  Follow-up in clinic in 1 to 2 months or sooner if needed.  September 3 at 8:30 AM  I have spent atleast 15 minutes non face to face with patient today. More than 50 % of the time was spent for psychoeducation and supportive psychotherapy and care coordination.  This note was generated in part or whole with voice recognition software. Voice recognition is usually quite accurate but there are transcription errors that can and very often do occur. I apologize for any typographical errors that were not detected and corrected.        Ursula Alert, MD 06/28/2019, 12:00 PM

## 2019-08-06 ENCOUNTER — Other Ambulatory Visit: Payer: Self-pay | Admitting: Family Medicine

## 2019-08-08 NOTE — Telephone Encounter (Signed)
Please call to schedule follow up in the next 15 days.

## 2019-08-08 NOTE — Telephone Encounter (Signed)
lvm to sche follow up

## 2019-08-18 ENCOUNTER — Other Ambulatory Visit: Payer: Self-pay | Admitting: Family Medicine

## 2019-08-19 NOTE — Telephone Encounter (Signed)
Needs appointment in the next 3 months

## 2019-08-23 NOTE — Telephone Encounter (Signed)
Lvm to sch appt

## 2019-09-05 ENCOUNTER — Other Ambulatory Visit: Payer: Self-pay

## 2019-09-06 ENCOUNTER — Other Ambulatory Visit: Payer: Self-pay

## 2019-09-07 ENCOUNTER — Other Ambulatory Visit: Payer: Self-pay | Admitting: Psychiatry

## 2019-09-07 DIAGNOSIS — F3341 Major depressive disorder, recurrent, in partial remission: Secondary | ICD-10-CM

## 2019-09-07 MED ORDER — AMLODIPINE BESYLATE 10 MG PO TABS: 10.0000 mg | ORAL_TABLET | Freq: Every day | ORAL | 0 refills | Status: DC

## 2019-11-14 ENCOUNTER — Telehealth: Payer: 59 | Admitting: Physician Assistant

## 2019-11-14 ENCOUNTER — Other Ambulatory Visit: Payer: Self-pay | Admitting: *Deleted

## 2019-11-14 DIAGNOSIS — Z20822 Contact with and (suspected) exposure to covid-19: Secondary | ICD-10-CM

## 2019-11-14 NOTE — Progress Notes (Signed)
E-Visit for Corona Virus Screening   Your current symptoms could be consistent with the coronavirus.  Many health care providers can now test patients at their office but not all are.  [place] has multiple testing sites. For information on our COVID testing locations and hours go to [email or web address]  Please quarantine yourself while awaiting your test results.  We are enrolling you in our [facility] for [place] . Daily you will receive a questionnaire within the [facility] website. Our COVID 19 response team willl be monitoriing your responses daily. Please continue good preventive care measures, including:  frequent hand-washing, avoid touching your face, cover coughs/sneezes, stay out of crowds and keep a 6 foot distance from others.    COVID-19 is a respiratory illness with symptoms that are similar to the flu. Symptoms are typically mild to moderate, but there have been cases of severe illness and death due to the virus. The following symptoms may appear 2-14 days after exposure: . Fever . Cough . Shortness of breath or difficulty breathing . Chills . Repeated shaking with chills . Muscle pain . Headache . Sore throat . New loss of taste or smell . Fatigue . Congestion or runny nose . Nausea or vomiting . Diarrhea  If you develop fever/cough/breathlessness, please stay home for 10 days with improving symptoms and until you have had 24 hours of no fever (without taking a fever reducer).  Go to the nearest hospital ED for assessment if fever/cough/breathlessness are severe or illness seems like a threat to life.  It is vitally important that if you feel that you have an infection such as this virus or any other virus that you stay home and away from places where you may spread it to others.  You should avoid contact with people age 8 and older.   You should wear a mask or cloth face covering over your nose and mouth if you must be around other  people or animals, including pets (even at home). Try to stay at least 6 feet away from other people. This will protect the people around you.  You can use medication such as  Over the counter cough and cold medicines  You may also take acetaminophen (Tylenol) as needed for fever.   Reduce your risk of any infection by using the same precautions used for avoiding the common cold or flu:  Marland Kitchen Wash your hands often with soap and warm water for at least 20 seconds.  If soap and water are not readily available, use an alcohol-based hand sanitizer with at least 60% alcohol.  . If coughing or sneezing, cover your mouth and nose by coughing or sneezing into the elbow areas of your shirt or coat, into a tissue or into your sleeve (not your hands). . Avoid shaking hands with others and consider head nods or verbal greetings only. . Avoid touching your eyes, nose, or mouth with unwashed hands.  . Avoid close contact with people who are sick. . Avoid places or events with large numbers of people in one location, like concerts or sporting events. . Carefully consider travel plans you have or are making. . If you are planning any travel outside or inside the [place], visit the CDC's Travelers' Health webpage for the latest health notices. . If you have some symptoms but not all symptoms, continue to monitor at home and seek medical attention if your symptoms worsen. . If you are having a medical emergency, call 911.  HOME CARE . Only  take medications as instructed by your medical team. . Drink plenty of fluids and get plenty of rest. . A steam or ultrasonic humidifier can help if you have congestion.   GET HELP RIGHT AWAY IF YOU HAVE EMERGENCY WARNING SIGNS** FOR COVID-19. If you or someone is showing any of these signs seek emergency medical care immediately. Call 911 or proceed to your closest emergency facility if: . You develop worsening high fever. . Trouble breathing . Bluish lips or face . Persistent  pain or pressure in the chest . New confusion . Inability to wake or stay awake . You cough up blood. . Your symptoms become more severe  **This list is not all possible symptoms. Contact your medical provider for any symptoms that are sever or concerning to you.   MAKE SURE YOU   Understand these instructions.  Will watch your condition.  Will get help right away if you are not doing well or get worse.  Your e-visit answers were reviewed by a board certified advanced clinical practitioner to complete your personal care plan.  Depending on the condition, your plan could have included both over the counter or prescription medications.  If there is a problem please reply once you have received a response from your provider.  Your safety is important to [place].  If you have drug allergies check your prescription carefully.    You can use MyChart to ask questions about today's visit, request a non-urgent call back, or ask for a work or school excuse for 24 hours related to this e-Visit. If it has been greater than 24 hours you will need to follow up with your provider, or enter a new e-Visit to address those concerns. You will get an e-mail in the next two days asking about your experience.  I hope that your e-visit has been valuable and will speed your recovery. Thank you for using e-visits.   5 minutes spent on this chart

## 2019-11-15 LAB — NOVEL CORONAVIRUS, NAA: SARS-CoV-2, NAA: NOT DETECTED

## 2019-11-22 ENCOUNTER — Other Ambulatory Visit: Payer: Self-pay | Admitting: Family Medicine

## 2019-11-28 ENCOUNTER — Other Ambulatory Visit: Payer: Self-pay

## 2019-11-29 ENCOUNTER — Other Ambulatory Visit: Payer: Self-pay

## 2019-11-29 NOTE — Telephone Encounter (Signed)
Pt has not been seen in over a year.  No refills until she has follow up.

## 2019-12-01 NOTE — Telephone Encounter (Signed)
APPT MADE

## 2019-12-06 ENCOUNTER — Other Ambulatory Visit: Payer: Self-pay

## 2019-12-06 ENCOUNTER — Ambulatory Visit (INDEPENDENT_AMBULATORY_CARE_PROVIDER_SITE_OTHER): Payer: Self-pay | Admitting: Family Medicine

## 2019-12-06 ENCOUNTER — Encounter: Payer: Self-pay | Admitting: Family Medicine

## 2019-12-06 DIAGNOSIS — Z1231 Encounter for screening mammogram for malignant neoplasm of breast: Secondary | ICD-10-CM

## 2019-12-06 DIAGNOSIS — F3340 Major depressive disorder, recurrent, in remission, unspecified: Secondary | ICD-10-CM

## 2019-12-06 DIAGNOSIS — Z87892 Personal history of anaphylaxis: Secondary | ICD-10-CM

## 2019-12-06 DIAGNOSIS — R928 Other abnormal and inconclusive findings on diagnostic imaging of breast: Secondary | ICD-10-CM

## 2019-12-06 DIAGNOSIS — Z13220 Encounter for screening for lipoid disorders: Secondary | ICD-10-CM

## 2019-12-06 DIAGNOSIS — E538 Deficiency of other specified B group vitamins: Secondary | ICD-10-CM

## 2019-12-06 DIAGNOSIS — I517 Cardiomegaly: Secondary | ICD-10-CM

## 2019-12-06 DIAGNOSIS — F411 Generalized anxiety disorder: Secondary | ICD-10-CM

## 2019-12-06 DIAGNOSIS — I10 Essential (primary) hypertension: Secondary | ICD-10-CM

## 2019-12-06 DIAGNOSIS — E559 Vitamin D deficiency, unspecified: Secondary | ICD-10-CM

## 2019-12-06 MED ORDER — AMLODIPINE BESYLATE 5 MG PO TABS: 5.0000 mg | ORAL_TABLET | Freq: Every day | ORAL | 1 refills | Status: DC

## 2019-12-06 MED ORDER — HYDROCHLOROTHIAZIDE 25 MG PO TABS: 25.0000 mg | ORAL_TABLET | Freq: Every day | ORAL | 0 refills | Status: DC

## 2019-12-06 MED ORDER — CARVEDILOL 25 MG PO TABS: ORAL_TABLET | ORAL | 1 refills | Status: DC

## 2019-12-06 MED ORDER — EPINEPHRINE 0.3 MG/0.3ML IJ SOAJ: 0.3000 mg | Freq: Once | INTRAMUSCULAR | 1 refills | Status: AC

## 2019-12-06 NOTE — Progress Notes (Signed)
Name: Natalie Petersen   MRN: [id number]    DOB: 1976/01/19   Date:12/06/2019       Progress Note  Subjective  Chief Complaint  Chief Complaint  Patient presents with  . Medication Refill    epi pen  . Hypertension  . Referral    mammogram    I connected with  Angy D Talamantez on 12/06/19 at  1:20 PM EST by telephone and verified that I am speaking with the correct person using two identifiers.  I discussed the limitations, risks, security and privacy concerns of performing an evaluation and management service by telephone and the availability of in person appointments. Staff also discussed with the patient that there may be a patient responsible charge related to this service. Patient Location: Home Provider Location: Office Additional Individuals present: None  HPI  HTN w/ LVH:  Checking BP's at home - most recent 143/85, HR 64. Taking Coreg - 69m BID; amlodipine 551mdaily, HCTZ 251maily.  Denies chest pain, shortness of breath, BLE edema, vision changes, headaches.  Had eye examination this year as well.  Hx abnormal mammogram of left breast: Has history of abnormal on LEFT side;   Depression & Anxiety: Has been seeing Dr. EapShea EvansShe is taking Wellbutrin and feeling good on this medication.  Anxiety has been well controlled. No SI/HI.  Staying well connected with friends and family virtually.  She is working on decreasing stress levels.   Obesity: Not exercising currently - education is provided; diet has been varied, working hard on improving her diet.  Vit D Def: Not taking supplement - due for labs B12 def: Taking supplement - due for labs   Patient Active Problem List   Diagnosis Date Noted  . MDD (major depressive disorder), recurrent, in partial remission (HCC[facility]lette7/06/2019  . GAD (generalized anxiety disorder) 06/28/2019  . Elevated hematocrit 10/28/2018  . LVH (left ventricular hypertrophy) 04/08/2018  . Abnormal EKG 04/08/2018  . Essential hypertension, benign  03/30/2018  . Family history of ovarian cancer 10/26/2017  . Cervical cancer screening 09/09/2016  . Abnormal mammogram of left breast 09/04/2015  . Obesity, Class III, BMI 40-49.9 (morbid obesity) (HCCHoffman7/25/2016  . Depression, major, recurrent, in remission (HCCChurchill7/25/2016  . Vitamin D deficiency 07/16/2015  . Oral contraceptive pill surveillance 07/16/2015  . Breast cancer screening 07/16/2015    Past Surgical History:  Procedure Laterality Date  . BRAIN SURGERY      Family History  Problem Relation Age of Onset  . Heart murmur Mother   . Atrial fibrillation Mother   . Arthritis Father        RA  . Hyperlipidemia Father   . Cancer Maternal Aunt        ovarian and uterine   . Arthritis Maternal Grandmother   . Heart attack Maternal Grandfather   . Stroke Paternal Grandmother   . Arthritis Paternal Grandmother   . Breast cancer Paternal Grandmother   . Stroke Paternal Uncle     Social History   Socioeconomic History  . Marital status: Married    Spouse name: Maximo Donaire  . Number of children: 1  . Years of education: Not on file  . Highest education level: Master's degree (e.g., MA, MS, MEng, MEd, MSW, MBA)  Occupational History    Comment: full time  Tobacco Use  . Smoking status: Never Smoker  . Smokeless tobacco: Never Used  Substance and Sexual Activity  . Alcohol use: Yes  Alcohol/week: 2.0 standard drinks    Types: 2 Glasses of wine per week  . Drug use: No  . Sexual activity: Yes    Partners: Male    Birth control/protection: Pill  Other Topics Concern  . Not on file  Social History Narrative  . Not on file   Social Determinants of Health   Financial Resource Strain:   . Difficulty of Paying Living Expenses: Not on file  Food Insecurity:   . Worried About Charity fundraiser in the Last Year: Not on file  . Ran Out of Food in the Last Year: Not on file  Transportation Needs:   . Lack of Transportation (Medical): Not on file  . Lack of  Transportation (Non-Medical): Not on file  Physical Activity:   . Days of Exercise per Week: Not on file  . Minutes of Exercise per Session: Not on file  Stress:   . Feeling of Stress : Not on file  Social Connections:   . Frequency of Communication with Friends and Family: Not on file  . Frequency of Social Gatherings with Friends and Family: Not on file  . Attends Religious Services: Not on file  . Active Member of Clubs or Organizations: Not on file  . Attends Archivist Meetings: Not on file  . Marital Status: Not on file  Intimate Partner Violence:   . Fear of Current or Ex-Partner: Not on file  . Emotionally Abused: Not on file  . Physically Abused: Not on file  . Sexually Abused: Not on file     Current Outpatient Medications:  .  amLODipine (NORVASC) 10 MG tablet, Take 1 tablet (10 mg total) by mouth daily., Disp: 30 tablet, Rfl: 0 .  buPROPion (WELLBUTRIN XL) 300 MG 24 hr tablet, TAKE 1 TABLET BY MOUTH EVERY DAY. TO BE TAKEN WITH 150 MG, Disp: 90 tablet, Rfl: 0 .  carvedilol (COREG) 25 MG tablet, TAKE 1 TABLET(25 MG) BY MOUTH TWICE DAILY WITH A MEAL, Disp: 30 tablet, Rfl: 0 .  EPINEPHrine 0.3 mg/0.3 mL IJ SOAJ injection, Inject 0.3 mg into the muscle once. , Disp: , Rfl:  .  hydrochlorothiazide (HYDRODIURIL) 25 MG tablet, TAKE 1 TABLET BY MOUTH  DAILY, Disp: 90 tablet, Rfl: 0 .  carvedilol (COREG) 12.5 MG tablet, TK 1 T PO BID, Disp: , Rfl:  .  Minoxidil (ROGAINE WOMENS) 5 % FOAM, Apply topically daily., Disp: , Rfl:   Allergies  Allergen Reactions  . Ace Inhibitors Swelling  . Lisinopril Swelling  . Amoxicillin Hives and Swelling  . Benadryl [Diphenhydramine Hcl (Sleep)] Hives and Swelling    I personally reviewed active problem list, medication list, allergies, health maintenance, notes from last encounter, lab results with the patient/caregiver today.   ROS  Constitutional: Negative for fever or weight change.  Respiratory: Negative for cough and  shortness of breath.   Cardiovascular: Negative for chest pain or palpitations.  Gastrointestinal: Negative for abdominal pain, no bowel changes.  Musculoskeletal: Negative for gait problem or joint swelling.  Skin: Negative for rash.  Neurological: Negative for dizziness or headache.  No other specific complaints in a complete review of systems (except as listed in HPI above).  Objective  Virtual encounter, vitals not obtained.  There is no height or weight on file to calculate BMI.  Physical Exam  Pulmonary/Chest: Effort normal. No respiratory distress. Speaking in complete sentences Neurological: Pt is alert and oriented to person, place, and time. Coordination, speech and gait are normal. Speech  is normal Psychiatric: Patient has a normal mood and affect. behavior is normal. Judgment and thought content normal.  No results found for this or any previous visit (from the past 72 hour(s)).  PHQ2/9: Depression screen [facility] 2/9 12/06/2019 10/28/2018 07/28/2018 07/28/2018 04/08/2018  Decreased Interest 0 0 0 0 0  Down, Depressed, Hopeless 0 0 0 0 0  PHQ - 2 Score 0 0 0 0 0  Altered sleeping 0 3 0 - -  Tired, decreased energy 0 3 1 - -  Change in appetite 0 0 0 - -  Feeling bad or failure about yourself  0 0 0 - -  Trouble concentrating 0 0 0 - -  Moving slowly or fidgety/restless 0 0 0 - -  Suicidal thoughts 0 0 0 - -  PHQ-9 Score 0 6 1 - -  Difficult doing work/chores Not difficult at all Not difficult at all Not difficult at all - -   PHQ-2/9 Result is negative.    Fall Risk: Fall Risk  12/06/2019 10/28/2018 07/28/2018 04/08/2018 10/13/2017  Falls in the past year? 0 0 No No No  Number falls in past yr: 0 - - - -  Injury with Fall? 0 - - - -  Follow up Falls evaluation completed - - - -    Assessment & Plan  1. Abnormal mammogram of left breast - MM 3D SCREEN BREAST BILATERAL; Future  2. Essential hypertension, benign - carvedilol (COREG) 25 MG tablet; TAKE 1 TABLET(25 MG) BY  MOUTH TWICE DAILY WITH A MEAL  Dispense: 90 tablet; Refill: 1 - amLODipine (NORVASC) 5 MG tablet; Take 1 tablet (5 mg total) by mouth daily.  Dispense: 90 tablet; Refill: 1 - hydrochlorothiazide (HYDRODIURIL) 25 MG tablet; Take 1 tablet (25 mg total) by mouth daily.  Dispense: 90 tablet; Refill: 0 - COMPLETE METABOLIC PANEL WITH GFR  3. LVH (left ventricular hypertrophy) - Taking carvedilol  4. Encounter for screening mammogram for malignant neoplasm of breast - MM 3D SCREEN BREAST BILATERAL; Future  5. Depression, major, recurrent, in remission ([place]) 6. GAD (generalized anxiety disorder) - Seeing Dr. Shea Evans  7. Obesity, Class III, BMI 40-49.9 (morbid obesity) ([place]) - Discussed importance of 150 minutes of physical activity weekly, eat two servings of fish weekly, eat one serving of tree nuts ( cashews, pistachios, pecans, almonds.Marland Kitchen) every other day, eat 6 servings of fruit/vegetables daily and drink plenty of water and avoid sweet beverages.  8. History of anaphylaxis - Had severe angioedema to Lisinopril - EPINEPHrine 0.3 mg/0.3 mL IJ SOAJ injection; Inject 0.3 mLs (0.3 mg total) into the muscle once for 1 dose.  Dispense: 0.3 mL; Refill: 1  9. Vitamin D deficiency - VITAMIN D 25 Hydroxy (Vit-D Deficiency, Fractures)  10. Vitamin B12 deficiency - B12 and Folate Panel  11. Lipid screening - Lipid panel   I discussed the assessment and treatment plan with the patient. The patient was provided an opportunity to ask questions and all were answered. The patient agreed with the plan and demonstrated an understanding of the instructions.   The patient was advised to call back or seek an in-person evaluation if the symptoms worsen or if the condition fails to improve as anticipated.  I provided 20 minutes of non-face-to-face time during this encounter.  Hubbard Hartshorn, FNP

## 2020-01-14 IMAGING — MG MM DIGITAL DIAGNOSTIC BILAT W/ TOMO W/ CAD
8 of 16 series · 8 of 40 positions shown · non-contrast
Comparison: Previous exam(s).

CLINICAL DATA: Follow-up for probably benign left breast mass.

EXAM:
DIGITAL DIAGNOSTIC BILATERAL MAMMOGRAM WITH CAD AND TOMO
LEFT BREAST ULTRASOUND

[R CC synth-2D (1 of 2)]
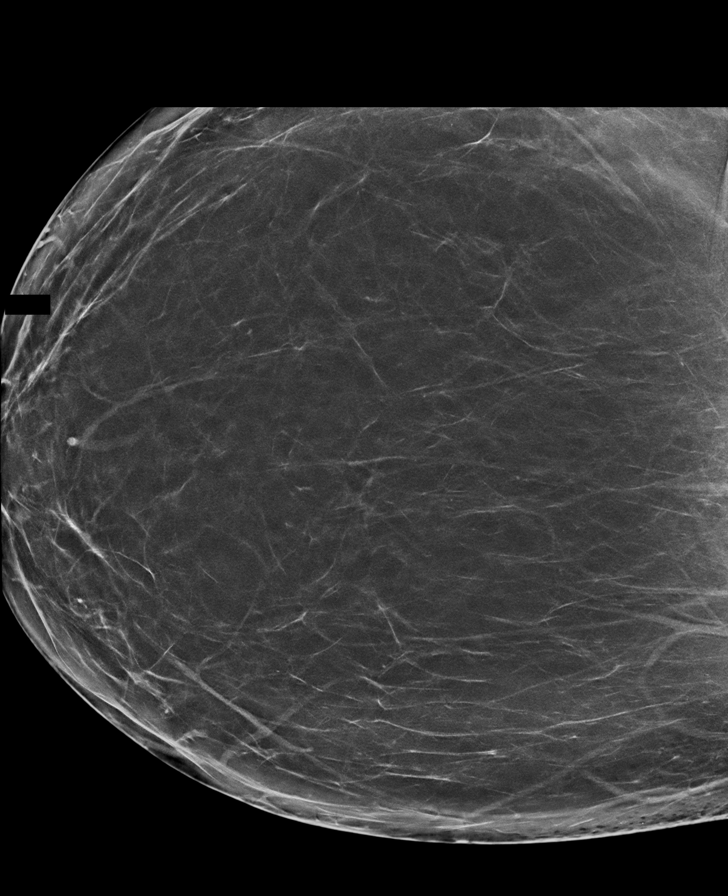

[R MLO synth-2D (1 of 2)]
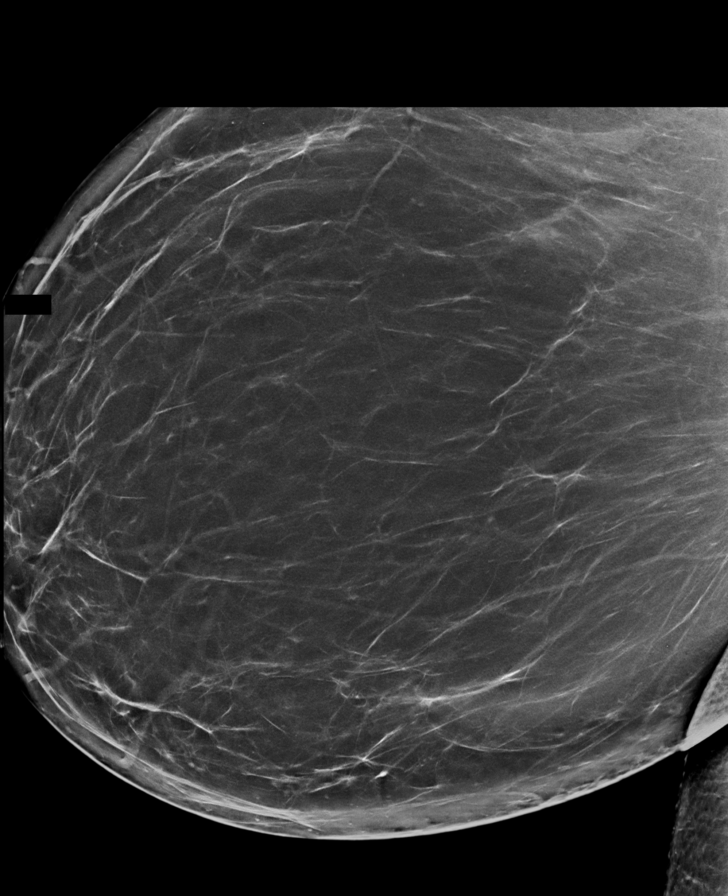

[L CC synth-2D]
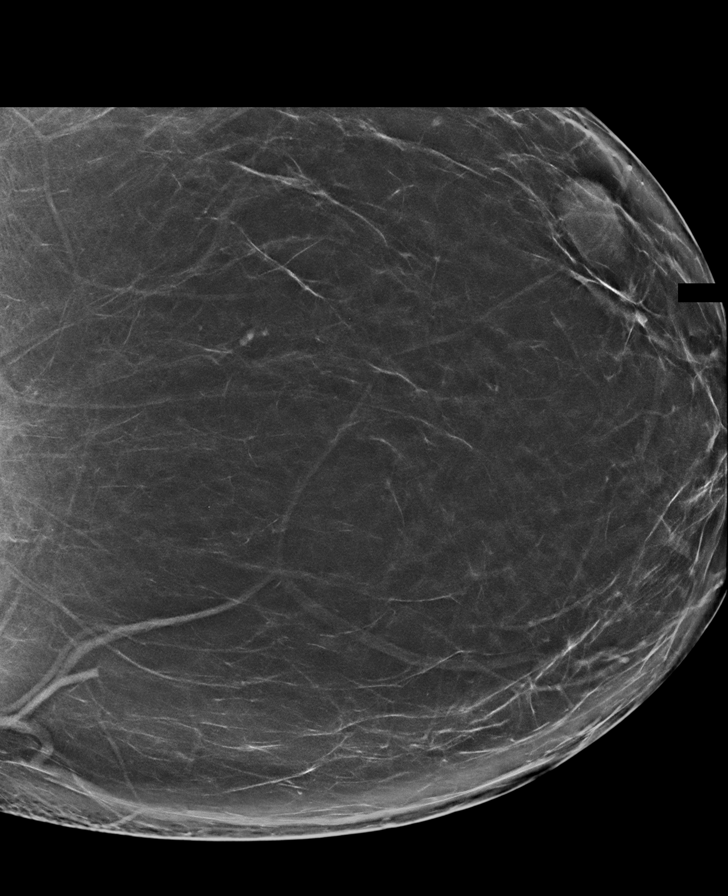

[R CC synth-2D (2 of 2)]
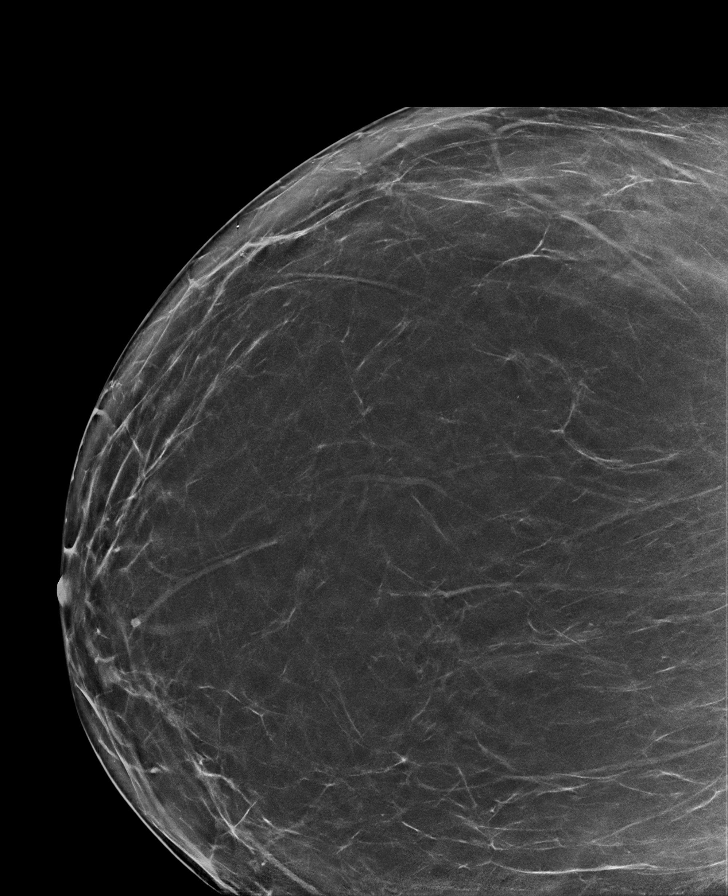

[R MLO synth-2D (2 of 2)]
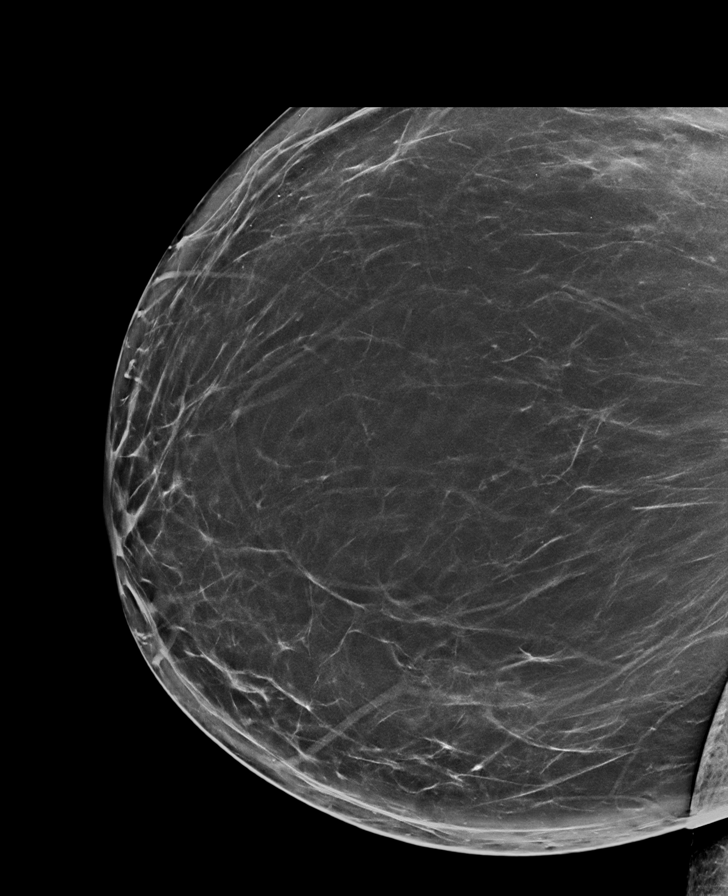

[L MLO synth-2D (1 of 2)]
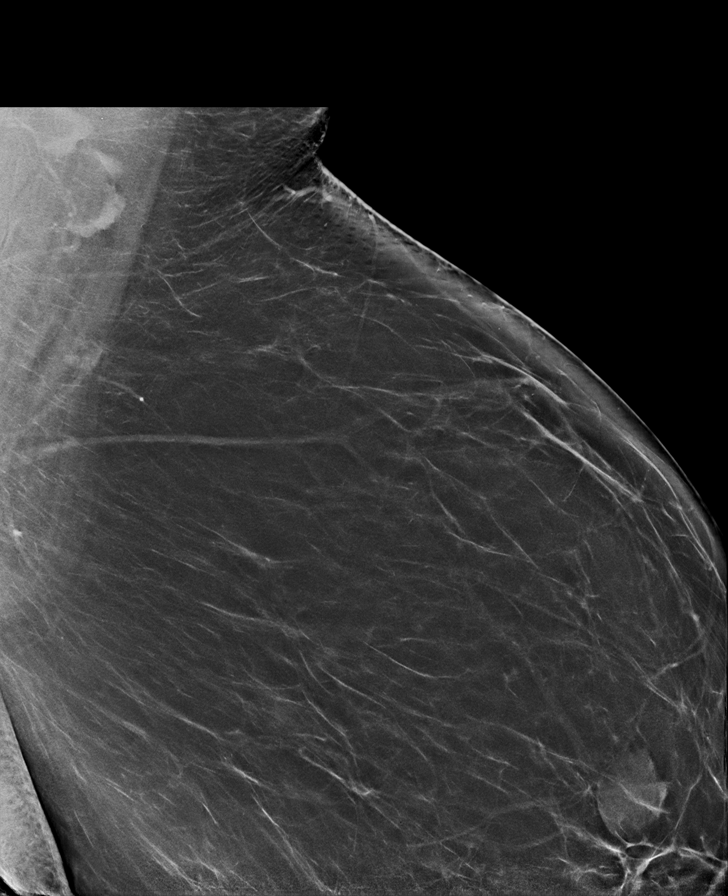

[L MLO synth-2D (2 of 2)]
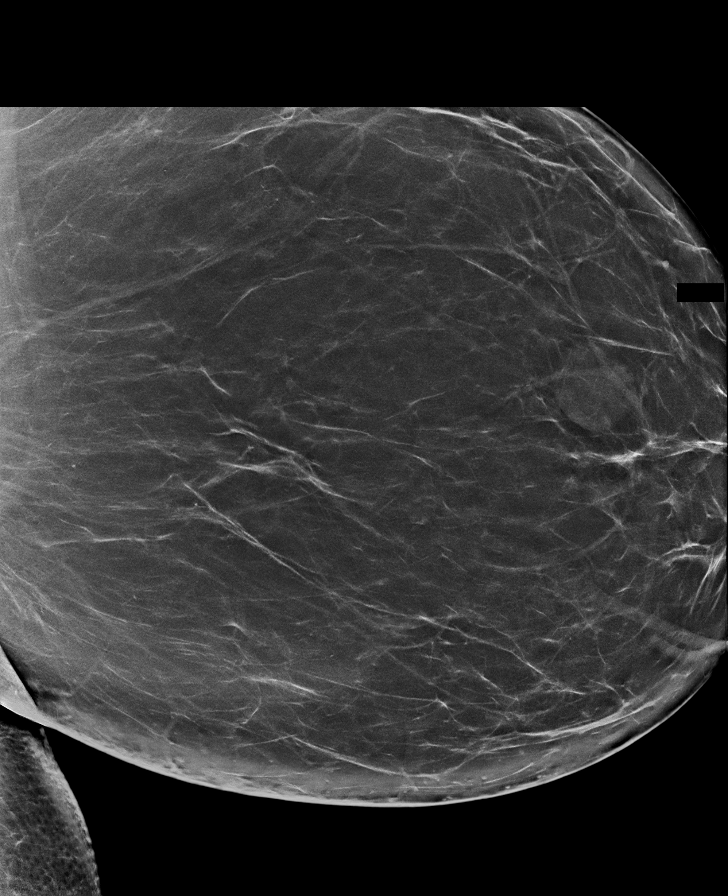

[R MLO tomo · tomo slice 53/105.0]
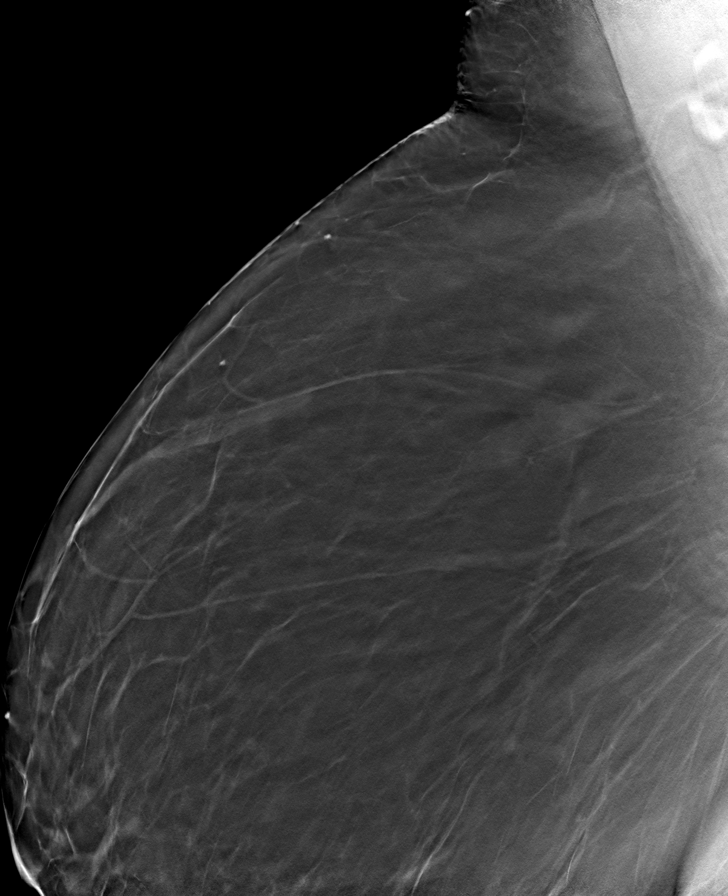

[8 of 40 positions shown; findings below may reference images not displayed]

ACR Breast Density Category b: There are scattered areas of
fibroglandular density.
FINDINGS: No suspicious masses or calcifications are seen in either breast.
The oval circumscribed mass in the outer left breast appears
unchanged compared to prior mammograms from 4650. There is no
mammographic evidence of malignancy in either breast.

Mammographic images were processed with CAD.

Targeted ultrasound of the outer left breast was performed. There is
an oval well-circumscribed mixed echogenicity mass with small cystic
spaces at 3 o'clock 5 cm from nipple measuring 2.8 x 1.1 x 2.4 cm,
previously measured 2.7 x 1.1 x 2.2 cm, overall stable dating back
to 04/08/2016 and therefore considered benign.
IMPRESSION: No findings of malignancy in either breast.

RECOMMENDATION:
Screening mammogram in one year.(Code:45-Z-Q1L)

I have discussed the findings and recommendations with the patient.
Results were also provided in writing at the conclusion of the
visit. If applicable, a reminder letter will be sent to the patient
regarding the next appointment.

BI-RADS CATEGORY  2: Benign.

## 2020-01-26 ENCOUNTER — Other Ambulatory Visit: Payer: Self-pay | Admitting: Family Medicine

## 2020-01-26 DIAGNOSIS — I10 Essential (primary) hypertension: Secondary | ICD-10-CM

## 2020-02-04 ENCOUNTER — Other Ambulatory Visit: Payer: Self-pay | Admitting: Family Medicine

## 2020-02-04 ENCOUNTER — Other Ambulatory Visit: Payer: Self-pay | Admitting: Psychiatry

## 2020-02-04 DIAGNOSIS — I10 Essential (primary) hypertension: Secondary | ICD-10-CM

## 2020-02-04 DIAGNOSIS — F3341 Major depressive disorder, recurrent, in partial remission: Secondary | ICD-10-CM

## 2020-02-04 NOTE — Telephone Encounter (Signed)
Requested Prescriptions  Pending Prescriptions Disp Refills  . hydrochlorothiazide (HYDRODIURIL) 25 MG tablet [Pharmacy Med Name: HYDROCHLOROTHIAZIDE  25MG  TAB] 90 tablet 0    Sig: TAKE 1 TABLET BY MOUTH  DAILY     Cardiovascular: Diuretics - Thiazide Failed - 02/04/2020  2:17 PM      Failed - Ca in normal range and within 360 days    Calcium  Date Value Ref Range Status  10/28/2018 9.7 8.6 - 10.2 mg/dL Final         Failed - Cr in normal range and within 360 days    Creat  Date Value Ref Range Status  10/28/2018 0.80 0.50 - 1.10 mg/dL Final         Failed - K in normal range and within 360 days    Potassium  Date Value Ref Range Status  10/28/2018 4.0 3.5 - 5.3 mmol/L Final         Failed - Na in normal range and within 360 days    Sodium  Date Value Ref Range Status  10/28/2018 139 135 - 146 mmol/L Final  04/20/2018 141 134 - 144 mmol/L Final         Passed - Last BP in normal range    BP Readings from Last 1 Encounters:  02/15/19 138/88         Passed - Valid encounter within last 6 months    Recent Outpatient Visits          2 months ago Abnormal mammogram of left breast   [place], FNP   1 year ago Essential hypertension, benign   [place], Satira Anis, MD   1 year ago Missed menses   [place], Satira Anis, MD   1 year ago Essential hypertension, benign   [place], Satira Anis, MD   1 year ago Essential hypertension, benign   [place], Satira Anis, MD

## 2020-02-07 MED ORDER — BUPROPION HCL ER (XL) 300 MG PO TB24: 300.0000 mg | ORAL_TABLET | Freq: Every day | ORAL | 0 refills | Status: DC

## 2020-02-07 NOTE — Telephone Encounter (Signed)
Sent 20-monthsupply of Wellbutrin to pharmacy.  She will need an appointment prior to future refills.

## 2020-02-17 ENCOUNTER — Ambulatory Visit
Admission: RE | Admit: 2020-02-17 | Discharge: 2020-02-17 | Disposition: A | Discharge status: Home / Self Care | Payer: 59 | Source: Ambulatory Visit | Attending: Family Medicine | Admitting: Family Medicine

## 2020-02-17 DIAGNOSIS — Z1231 Encounter for screening mammogram for malignant neoplasm of breast: Secondary | ICD-10-CM | POA: Diagnosis present

## 2020-02-17 DIAGNOSIS — R928 Other abnormal and inconclusive findings on diagnostic imaging of breast: Secondary | ICD-10-CM | POA: Insufficient documentation

## 2020-02-20 ENCOUNTER — Other Ambulatory Visit: Payer: Self-pay | Admitting: Family Medicine

## 2020-02-20 DIAGNOSIS — N6320 Unspecified lump in the left breast, unspecified quadrant: Secondary | ICD-10-CM

## 2020-02-20 DIAGNOSIS — R928 Other abnormal and inconclusive findings on diagnostic imaging of breast: Secondary | ICD-10-CM

## 2020-02-29 ENCOUNTER — Ambulatory Visit
Admission: RE | Admit: 2020-02-29 | Discharge: 2020-02-29 | Disposition: A | Discharge status: Home / Self Care | Payer: 59 | Source: Ambulatory Visit | Attending: Family Medicine | Admitting: Family Medicine

## 2020-02-29 ENCOUNTER — Other Ambulatory Visit: Payer: Self-pay | Admitting: Family Medicine

## 2020-02-29 DIAGNOSIS — R928 Other abnormal and inconclusive findings on diagnostic imaging of breast: Secondary | ICD-10-CM

## 2020-02-29 DIAGNOSIS — N6320 Unspecified lump in the left breast, unspecified quadrant: Secondary | ICD-10-CM | POA: Diagnosis present

## 2020-03-05 ENCOUNTER — Encounter: Payer: Self-pay | Admitting: Family Medicine

## 2020-03-09 ENCOUNTER — Ambulatory Visit
Admission: RE | Admit: 2020-03-09 | Discharge: 2020-03-09 | Disposition: A | Discharge status: Home / Self Care | Payer: 59 | Source: Ambulatory Visit | Attending: Family Medicine | Admitting: Family Medicine

## 2020-03-09 DIAGNOSIS — N6320 Unspecified lump in the left breast, unspecified quadrant: Secondary | ICD-10-CM | POA: Insufficient documentation

## 2020-03-09 DIAGNOSIS — R928 Other abnormal and inconclusive findings on diagnostic imaging of breast: Secondary | ICD-10-CM | POA: Diagnosis not present

## 2020-03-09 HISTORY — PX: BREAST BIOPSY: SHX20

## 2020-03-12 LAB — SURGICAL PATHOLOGY

## 2020-03-14 ENCOUNTER — Telehealth: Payer: Self-pay

## 2020-03-14 NOTE — Telephone Encounter (Signed)
Patient has an appt with Dr. Dahlia Byes on April 5 at 9:15 with arrival time of 9:00.  Patient notified of time and date.

## 2020-03-26 ENCOUNTER — Encounter: Payer: Self-pay | Admitting: Family Medicine

## 2020-03-26 ENCOUNTER — Ambulatory Visit (INDEPENDENT_AMBULATORY_CARE_PROVIDER_SITE_OTHER): Payer: 59 | Admitting: Surgery

## 2020-03-26 ENCOUNTER — Encounter: Payer: Self-pay | Admitting: Surgery

## 2020-03-26 ENCOUNTER — Other Ambulatory Visit: Payer: Self-pay

## 2020-03-26 VITALS — BP 137/85 | HR 77 | Temp 97.9°F | Ht 69.5 in | Wt 293.6 lb

## 2020-03-26 DIAGNOSIS — D242 Benign neoplasm of left breast: Secondary | ICD-10-CM

## 2020-03-26 NOTE — Patient Instructions (Addendum)
Fibroadenoma Fibroadenoma is a breast tumor that is not cancerous (is benign). These tumors are made up of breast tissue and the tissue that holds breast tissue together (connective tissue). There are several types:  Simple fibroadenoma. This is the most common type. It contains only one type of tissue.  Complex fibroadenoma. This type contains more than one kind of tissue or irregular tissue, such as pockets of fluid (cysts) or deposits of calcium (calcifications) in the breast.  Juvenile fibroadenoma. This is a type of tumor that can develop in adolescent girls. It tends to grow larger over time than other adenomas. Although fibroadenomas are not cancerous, having a fibroadenoma may slightly increase your risk for developing breast cancer in the future. What are the causes? The cause of fibroadenoma is not known. What increases the risk? This condition is more likely to develop in:  Women who are 46-69 years old.  Women of African American descent. What are the signs or symptoms? You might have no symptoms. Some fibroadenomas are too small to feel. If you can feel it, it may feel like a lump in your breast that is:  Firm.  Round.  Smooth.  Slightly movable. A fibroadenoma usually occurs as a single lump, but sometimes there may be more than one lump. They can occur in one breast or in both breasts. Fibroadenomas vary in size. They usually do not cause pain unless they grow to a large size. How is this diagnosed? This condition may be diagnosed based on:  Your symptoms and medical history.  A physical exam. You may notice a lump during a breast self-exam, or your health care provider may notice it during a routine breast exam or breast X-ray (mammogram).  An ultrasound to check for fluid inside the lump (cystic tumor). If there is fluid, some fluid may be removed with a needle and examined under a microscope.  A mammogram to examine a lump that does not contain fluid (solid).  Depending on mammogram results, you may need to have a procedure to remove a tissue sample from the lump using a needle (breast biopsy). The tissue will be examined under a microscope. How is this treated? Treatment for this condition may include:  Having clinical breast exams regularly to check for changes in your fibroadenoma.  Having the fibroadenoma removed. A fibroadenoma may be removed if it is: ? Large. ? Continuing to grow. ? Causing symptoms, such as pain or a change in the appearance of your breast. ? A juvenile fibroadenoma. These tend to grow large over time. Follow these instructions at home:   Do breast self-exams at home as told by your health care provider. Monitor your fibroadenoma, the skin of your breasts, and your nipples for any changes.  Do not use any products that contain nicotine or tobacco, such as cigarettes and e-cigarettes. These can further increase your cancer risk. If you need help quitting, ask your health care provider.  Keep all follow-up visits as told by your health care provider. This is important. You will need breast exams on a regular basis. Contact a health care provider if:  Your fibroadenoma: ? Gets larger. ? Feels different. ? Becomes painful.  You find a new breast lump.  You have any changes in your breast skin, such as: ? Dimpling. ? Bruising. ? Thickening. ? Redness.  You have any changes in your nipple, such as: ? Fluid leaking from a nipple. ? Redness. Summary  Fibroadenoma is a breast tumor that is not cancerous (  is benign) and is made up of breast tissue and the tissue that holds breast tissue together (connective tissue).  Although fibroadenomas are not cancerous, having a fibroadenoma may slightly increase your risk for developing breast cancer in the future.  A fibroadenoma may feel like a lump in your breast. It is usually firm, round, smooth, and slightly movable. Some fibroadenomas are too small to be felt.  Do  breast self-exams at home as told by your health care provider. Monitor your fibroadenoma, the skin of your breasts, and your nipples for any changes. This information is not intended to replace advice given to you by your health care provider. Make sure you discuss any questions you have with your health care provider. Document Revised: 12/18/2017 Document Reviewed: 12/08/2017 Elsevier Patient Education  [place] After This sheet gives you information about how to care for yourself after your procedure. Your health care provider may also give you more specific instructions. If you have problems or questions, contact your health care provider. What can I expect after the procedure? After the procedure, it is common to have:  Breast swelling.  Breast tenderness.  Stiffness in your arm or shoulder.  A change in the shape and feel of your breast.  Scar tissue that feels hard to the touch in the area where the lump was removed. Follow these instructions at home: Medicines  Take over-the-counter and prescription medicines only as told by your health care provider.  If you were prescribed an antibiotic medicine, take it as told by your health care provider. Do not stop taking the antibiotic even if you start to feel better.  Ask your health care provider if the medicine prescribed to you: ? Requires you to avoid driving or using heavy machinery. ? Can cause constipation. You may need to take these actions to prevent or treat constipation:  Drink enough fluid to keep your urine pale yellow.  Take over-the-counter or prescription medicines.  Eat foods that are high in fiber, such as beans, whole grains, and fresh fruits and vegetables.  Limit foods that are high in fat and processed sugars, such as fried or sweet foods. Incision care      Follow instructions from your health care provider about how to take care of your incision. Make sure you: ? Wash  your hands with soap and water before and after you change your bandage (dressing). If soap and water are not available, use hand sanitizer. ? Change your dressing as told by your health care provider. ? Leave stitches (sutures), skin glue, or adhesive strips in place. These skin closures may need to stay in place for 2 weeks or longer. If adhesive strip edges start to loosen and curl up, you may trim the loose edges. Do not remove adhesive strips completely unless your health care provider tells you to do that.  Check your incision area every day for signs of infection. Check for: ? More redness, swelling, or pain. ? Fluid or blood. ? Warmth. ? Pus or a bad smell.  Keep your dressing clean and dry.  If you were sent home with a surgical drain in place, follow instructions from your health care provider about emptying it. Bathing  Do not take baths, swim, or use a hot tub until your health care provider approves.  Ask your health care provider if you may take showers. You may only be allowed to take sponge baths. Activity  Rest as told by your health  care provider.  Avoid sitting for a long time without moving. Get up to take short walks every 1-2 hours. This is important to improve blood flow and breathing. Ask for help if you feel weak or unsteady.  Return to your normal activities as told by your health care provider. Ask your health care provider what activities are safe for you.  Be careful to avoid any activities that could cause an injury to your arm on the side of your surgery.  Do not lift anything that is heavier than 10 lb (4.5 kg), or the limit that you are told, until your health care provider says that it is safe. Avoid lifting with the arm that is on the side of your surgery.  Do not carry heavy objects on your shoulder on the side of your surgery.  Do exercises to keep your shoulder and arm from getting stiff and swollen. Talk with your health care provider about which  exercises are safe for you. General instructions  Wear a supportive bra as told by your health care provider.  Raise (elevate) your arm above the level of your heart while you are sitting or lying down.  Do not wear tight jewelry on your arm, wrist, or fingers on the side of your surgery.  Keep all follow-up visits as told by your health care provider. This is important. ? You may need to be screened for extra fluid around the lymph nodes and swelling in the breast and arm (lymphedema). Follow instructions from your health care provider about how often you should be checked.  If you had any lymph nodes removed during your procedure, be sure to tell all of your health care providers. This is important information to share before you are involved in certain procedures, such as having blood tests or having your blood pressure taken. Contact a health care provider if:  You develop a rash.  You have a fever.  Your pain medicine is not working.  You have swelling, weakness, or numbness in your arm that does not improve after a few weeks.  You have new swelling in your breast.  You have any of these signs of infection: ? More redness, swelling, or pain in your incision area. ? Fluid or blood coming from your incision. ? Warmth coming from the incision area. ? Pus or a bad smell coming from your incision. Get help right away if you have:  Very bad pain in your breast or arm.  Swelling in your legs or arms.  Redness, warmth, or pain in your leg or arm.  Chest pain.  Difficulty breathing. Summary  After the procedure, it is common to have breast tenderness, swelling in your breast, and stiffness in your arm and shoulder.  Follow instructions from your health care provider about how to take care of your incision.  Do not lift anything that is heavier than 10 lb (4.5 kg), or the limit that you are told, until your health care provider says that it is safe. Avoid lifting with the  arm that is on the side of your surgery.  If you had any lymph nodes removed during your procedure, be sure to tell all of your health care providers. This is important information to share before you are involved in certain procedures, such as having blood tests or having your blood pressure taken. This information is not intended to replace advice given to you by your health care provider. Make sure you discuss any questions you have with  your health care provider. Document Revised: 06/13/2019 Document Reviewed: 06/13/2019 Elsevier Patient Education  [place].

## 2020-03-27 ENCOUNTER — Other Ambulatory Visit: Payer: Self-pay | Admitting: Family Medicine

## 2020-03-27 DIAGNOSIS — R928 Other abnormal and inconclusive findings on diagnostic imaging of breast: Secondary | ICD-10-CM

## 2020-03-28 ENCOUNTER — Encounter: Payer: Self-pay | Admitting: Surgery

## 2020-03-28 NOTE — Progress Notes (Signed)
Patient ID: Natalie Petersen, female   DOB: 1976-08-04, 44 y.o.   MRN: [id number]  HPI Natalie Petersen is a 44 y.o. female seen in consultation at the request of Mrs. Uvaldo Rising FNP.  For abdominal ultrasound revealing changes consistent with fibroadenoma of the left breast.  He underwent a mammogram on February 17, 2020 that I have personally reviewed showing evidence of possible mass this prompted an ultrasound showing findings consistent with a fibroadenoma.  Please note that the fibroadenoma now measures 3.5 x 3.2 x 1.4 cm and has increased significantly as compared to the mammogram from a year and a half ago.  She underwent a biopsy showing evidence of fibroadenoma. Patient specifically denies any breast changes.  There is some mild pain related to the biopsy.  No fevers no chills no nipple discharge.  No lymphadenopathy.  Her CBC and CMP were completely normal.  Hemoglobin 1 AC is completely normal her BMI is 42.7 Maternal aunt with ovarian hx cancer, grandmother with a history of breast cancer. She is able to perform more than 4 METS of activity without any shortness of breath or chest pain.   HPI      Past Medical History:  Diagnosis Date  . Allergy    seasonal  . Anxiety   . Depression   . Menopausal symptoms   . Metrorrhagia   . Obesity, Class III, BMI 40-49.9 (morbid obesity) ([place]) 07/16/2015  . Vitamin D deficiency     Past Surgical History:  Procedure Laterality Date  . BRAIN SURGERY      Family History  Problem Relation Age of Onset  . Heart murmur Mother   . Atrial fibrillation Mother   . Arthritis Father        RA  . Hyperlipidemia Father   . Cancer Maternal Aunt        ovarian and uterine   . Arthritis Maternal Grandmother   . Heart attack Maternal Grandfather   . Stroke Paternal Grandmother   . Arthritis Paternal Grandmother   . Breast cancer Paternal Grandmother   . Stroke Paternal Uncle     Social History Social History   Tobacco Use  . Smoking status: Never  Smoker  . Smokeless tobacco: Never Used  Substance Use Topics  . Alcohol use: Yes    Alcohol/week: 2.0 standard drinks    Types: 2 Glasses of wine per week  . Drug use: No    Allergies  Allergen Reactions  . Ace Inhibitors Swelling  . Lisinopril Swelling  . Amoxicillin Hives and Swelling  . Benadryl [Diphenhydramine Hcl (Sleep)] Hives and Swelling    Current Outpatient Medications  Medication Sig Dispense Refill  . amLODipine (NORVASC) 5 MG tablet Take 1 tablet (5 mg total) by mouth daily. 90 tablet 1  . buPROPion (WELLBUTRIN XL) 300 MG 24 hr tablet Take 1 tablet (300 mg total) by mouth daily. 90 tablet 0  . carvedilol (COREG) 25 MG tablet TAKE 1 TABLET BY MOUTH  TWICE DAILY WITH A MEAL 180 tablet 1  . hydrochlorothiazide (HYDRODIURIL) 25 MG tablet TAKE 1 TABLET BY MOUTH  DAILY 90 tablet 0  . vitamin B-12 (CYANOCOBALAMIN) 1000 MCG tablet Take 1,000 mcg by mouth daily.    . Vitamin D, Ergocalciferol, (DRISDOL) 1.25 MG (50000 UNIT) CAPS capsule Take 50,000 Units by mouth every 7 (seven) days.    Marland Kitchen EPINEPHrine 0.3 mg/0.3 mL IJ SOAJ injection     . Minoxidil (ROGAINE WOMENS) 5 % FOAM Apply topically daily.  No current facility-administered medications for this visit.     Review of Systems Full ROS  was asked and was negative except for the information on the HPI  Physical Exam Blood pressure 137/85, pulse 77, temperature 97.9 F (36.6 C), temperature source Temporal, height 5' 9.5" (1.765 m), weight 293 lb 9.6 oz (133.2 kg), SpO2 95 %. CONSTITUTIONAL: NAD EYES: Pupils are equal, round, and reactive to light, Sclera are non-icteric. EARS, NOSE, MOUTH AND THROAT: The oropharynx is clear. The oral mucosa is pink and moist. Hearing is intact to voice. LYMPH NODES:  Lymph nodes in the neck are normal. RESPIRATORY:  Lungs are clear. There is normal respiratory effort, with equal breath sounds bilaterally, and without pathologic use of accessory muscles. CARDIOVASCULAR: Heart is  regular without murmurs, gallops, or rubs. BREAST: Pedunculated bilateral breasts with ptosis.  There seems to be thickening located at 3:00 measuring approximately 2 and half centimeters.  It is difficult to discern given the size of the breast parenchyma.  No other breast lesions.  No lymphadenopathy  .  Well-healed biopsy site.  No evidence of nipple discharge. GI: The abdomen is   soft, nontender, and nondistended. There are no palpable masses. There is no hepatosplenomegaly. There are normal bowel sounds in all quadrants. GU: Rectal deferred.   MUSCULOSKELETAL: Normal muscle strength and tone. No cyanosis or edema.   SKIN: Turgor is good and there are no pathologic skin lesions or ulcers. NEUROLOGIC: Motor and sensation is grossly normal. Cranial nerves are grossly intact. PSYCH:  Oriented to person, place and time. Affect is normal.  Data Reviewed I have personally reviewed the patient's imaging, laboratory findings and medical records.    Assessment/Plan 44 year old female with an enlarging fibroadenoma on the left breast.  Discuussed with the patient in detail about this findings. Typically given the enlargement of the fibroadenoma traditional ways do  recommend excisional biopsy/lumpectomy.  Patient was well informed about her disease and was interested in cryoablation therapy.  I  admitted that I did not offer this services .  I understand that it is considered effective but have no experience with this procedure and therefore cannot either recommend nor advise against it. We did however talk about potential surgical intervention that will involve excisional biopsy and final pathological evaluation. She also asked about how many procedures had I done before and whether or not I was familiar with fibroadenoma as an lumpectomies.  I volunteer my statistics and she was pleased with my experience. She wants to explore other options including the cryoablation and she seems to not be  interested in surgical excision. Minimal I recommend close follow-up with an additional imaging study in about 6 months if she were to wait.  She will let us know about her final thoughts regarding her fibroadenoma  A copy of this report was sent to the referring provider    Caroleen Hamman, MD FACS General Surgeon 03/28/2020, 11:01 AM

## 2020-04-02 ENCOUNTER — Encounter: Payer: Self-pay | Admitting: Family Medicine

## 2020-04-02 ENCOUNTER — Other Ambulatory Visit: Payer: Self-pay | Admitting: Family Medicine

## 2020-04-02 DIAGNOSIS — R928 Other abnormal and inconclusive findings on diagnostic imaging of breast: Secondary | ICD-10-CM

## 2020-04-16 ENCOUNTER — Other Ambulatory Visit: Payer: Self-pay | Admitting: Family Medicine

## 2020-04-16 DIAGNOSIS — I10 Essential (primary) hypertension: Secondary | ICD-10-CM

## 2020-04-16 NOTE — Telephone Encounter (Signed)
Requested Prescriptions  Pending Prescriptions Disp Refills  . amLODipine (NORVASC) 5 MG tablet [Pharmacy Med Name: AMLODIPINE  5MG  TAB] 90 tablet 3    Sig: TAKE 1 TABLET BY MOUTH  DAILY     Cardiovascular:  Calcium Channel Blockers Passed - 04/16/2020 10:25 PM      Passed - Last BP in normal range    BP Readings from Last 1 Encounters:  03/26/20 137/85         Passed - Valid encounter within last 6 months    Recent Outpatient Visits          4 months ago Abnormal mammogram of left breast   [place], FNP   1 year ago Essential hypertension, benign   [place], Satira Anis, MD   1 year ago Missed menses   [place], Satira Anis, MD   1 year ago Essential hypertension, benign   [place], Satira Anis, MD   2 years ago Essential hypertension, benign   [place], Satira Anis, MD

## 2020-05-18 ENCOUNTER — Other Ambulatory Visit: Payer: Self-pay | Admitting: Psychiatry

## 2020-05-18 DIAGNOSIS — F3341 Major depressive disorder, recurrent, in partial remission: Secondary | ICD-10-CM

## 2020-05-18 MED ORDER — BUPROPION HCL ER (XL) 300 MG PO TB24: 300.0000 mg | ORAL_TABLET | Freq: Every day | ORAL | 0 refills | Status: DC

## 2020-05-18 NOTE — Telephone Encounter (Signed)
Patient needs appointment prior to future refills of her medication.  Will only give 30-day supply of Wellbutrin.  We will have Janett Billow CMA contact this patient to discuss appointment or she can transition back to her primary care provider.

## 2020-05-22 ENCOUNTER — Other Ambulatory Visit: Payer: Self-pay | Admitting: Family Medicine

## 2020-05-22 ENCOUNTER — Other Ambulatory Visit: Payer: Self-pay | Admitting: Emergency Medicine

## 2020-05-22 DIAGNOSIS — I10 Essential (primary) hypertension: Secondary | ICD-10-CM

## 2020-05-22 MED ORDER — HYDROCHLOROTHIAZIDE 25 MG PO TABS: 25.0000 mg | ORAL_TABLET | Freq: Every day | ORAL | 0 refills | Status: DC

## 2020-05-22 NOTE — Telephone Encounter (Signed)
Requested medication (s) are due for refill today: no  Requested medication (s) are on the active medication list: yes  Last refill:  05/22/2020  Future visit scheduled: no  Notes to clinic:   Patient requests 90 days supply  Requested Prescriptions  Pending Prescriptions Disp Refills   hydrochlorothiazide (HYDRODIURIL) 25 MG tablet [Pharmacy Med Name: HYDROCHLOROTHIAZIDE 25MG TABLETS] 90 tablet     Sig: TAKE 1 TABLET(25 MG) BY MOUTH DAILY      Cardiovascular: Diuretics - Thiazide Failed - 05/22/2020  1:19 PM      Failed - Ca in normal range and within 360 days    Calcium  Date Value Ref Range Status  10/28/2018 9.7 8.6 - 10.2 mg/dL Final          Failed - Cr in normal range and within 360 days    Creat  Date Value Ref Range Status  10/28/2018 0.80 0.50 - 1.10 mg/dL Final          Failed - K in normal range and within 360 days    Potassium  Date Value Ref Range Status  10/28/2018 4.0 3.5 - 5.3 mmol/L Final          Failed - Na in normal range and within 360 days    Sodium  Date Value Ref Range Status  10/28/2018 139 135 - 146 mmol/L Final  04/20/2018 141 134 - 144 mmol/L Final          Passed - Last BP in normal range    BP Readings from Last 1 Encounters:  03/26/20 137/85          Passed - Valid encounter within last 6 months    Recent Outpatient Visits           5 months ago Abnormal mammogram of left breast   [place], FNP   1 year ago Essential hypertension, benign   [place], Satira Anis, MD   1 year ago Missed menses   [place], Satira Anis, MD   2 years ago Essential hypertension, benign   [place], Satira Anis, MD   2 years ago Essential hypertension, benign   [place], Satira Anis, MD

## 2020-05-28 NOTE — Telephone Encounter (Signed)
Must be seen it has been 6 months

## 2020-05-28 NOTE — Telephone Encounter (Signed)
Pt already has an appt scheduled for 6.25.2021 with [facility]

## 2020-06-15 ENCOUNTER — Ambulatory Visit (INDEPENDENT_AMBULATORY_CARE_PROVIDER_SITE_OTHER): Payer: 59 | Admitting: Family Medicine

## 2020-06-15 ENCOUNTER — Other Ambulatory Visit: Payer: Self-pay

## 2020-06-15 ENCOUNTER — Encounter: Payer: Self-pay | Admitting: Family Medicine

## 2020-06-15 VITALS — BP 122/82 | HR 79 | Temp 98.1°F | Resp 16 | Ht 69.5 in | Wt 289.4 lb

## 2020-06-15 DIAGNOSIS — I517 Cardiomegaly: Secondary | ICD-10-CM | POA: Diagnosis not present

## 2020-06-15 DIAGNOSIS — E559 Vitamin D deficiency, unspecified: Secondary | ICD-10-CM

## 2020-06-15 DIAGNOSIS — D242 Benign neoplasm of left breast: Secondary | ICD-10-CM

## 2020-06-15 DIAGNOSIS — R718 Other abnormality of red blood cells: Secondary | ICD-10-CM

## 2020-06-15 DIAGNOSIS — F3341 Major depressive disorder, recurrent, in partial remission: Secondary | ICD-10-CM

## 2020-06-15 DIAGNOSIS — I10 Essential (primary) hypertension: Secondary | ICD-10-CM

## 2020-06-15 MED ORDER — HYDROCHLOROTHIAZIDE 25 MG PO TABS: 25.0000 mg | ORAL_TABLET | Freq: Every day | ORAL | 3 refills | Status: DC

## 2020-06-15 MED ORDER — BUPROPION HCL ER (XL) 300 MG PO TB24: 300.0000 mg | ORAL_TABLET | Freq: Every day | ORAL | 3 refills | Status: DC

## 2020-06-15 MED ORDER — AMLODIPINE BESYLATE 5 MG PO TABS: 5.0000 mg | ORAL_TABLET | Freq: Every day | ORAL | 3 refills | Status: DC

## 2020-06-15 MED ORDER — CARVEDILOL 25 MG PO TABS: ORAL_TABLET | ORAL | 3 refills | Status: DC

## 2020-06-15 NOTE — Progress Notes (Signed)
Name: Natalie Petersen   MRN: [id number]    DOB: [birth year]   Date:06/15/2020       Progress Note  Chief Complaint  Patient presents with  . Follow-up  . Medication Refill  . Hypertension     Subjective:   Natalie Petersen is a 44 y.o. female, presents to clinic for   Hypertension:  Currently managed on coreg, HCTZ, norvasc Pt reports good med compliance and denies any SE.  No lightheadedness, hypotension, syncope. Blood pressure today is well controlled. BP Readings from Last 3 Encounters:  06/15/20 122/82  03/26/20 137/85  02/15/19 138/88   Pt denies CP, SOB, exertional sx, LE edema, palpitation, Ha's, visual disturbances Dietary efforts for BP?  Tries to avoid extra salt  She has been trying to lose weight Wt Readings from Last 5 Encounters:  06/15/20 289 lb 6.4 oz (131.3 kg)  03/26/20 293 lb 9.6 oz (133.2 kg)  02/15/19 (!) 300 lb 9.6 oz (136.4 kg)  11/03/18 293 lb (132.9 kg)  10/28/18 288 lb 4.8 oz (130.8 kg)   BMI Readings from Last 5 Encounters:  06/15/20 42.12 kg/m  03/26/20 42.74 kg/m  02/15/19 43.75 kg/m  11/03/18 43.27 kg/m  10/28/18 42.57 kg/m   She asks about refills on her Wellbutrin, she was told by a different provider that she could not have refills without office visits, she states that her moods are well controlled currently, she was having more difficulty in the past was previously on higher doses. She feels she is doing well with Wellbutrin 300 mg daily no side effects or concerns Depression screen [facility] 2/9 06/15/2020 12/06/2019 10/28/2018  Decreased Interest 0 0 0  Down, Depressed, Hopeless 0 0 0  PHQ - 2 Score 0 0 0  Altered sleeping 0 0 3  Tired, decreased energy 0 0 3  Change in appetite 0 0 0  Feeling bad or failure about yourself  0 0 0  Trouble concentrating 0 0 0  Moving slowly or fidgety/restless 0 0 0  Suicidal thoughts 0 0 0  PHQ-9 Score 0 0 6  Difficult doing work/chores Not difficult at all Not difficult at all Not difficult at all     PHQ was reviewed today, negative  Is history of vitamin D deficiency-she would like to have it rechecked  Left breast mass-she was recommended to have a hysterectomy due to pathology results, she did seek a second opinion at [facility]: A: Breast, left, 3:00 5CMFN, core biopsy - Fibroadenoma with apocrine metaplasia and pseudoangiomatous stromal hyperplasia - No atypia, in situ or invasive carcinoma identified Office visits, radiology and surgical pathology was reviewed from visits earlier this year from March to May 2021   Current Outpatient Medications:  .  amLODipine (NORVASC) 5 MG tablet, Take 1 tablet (5 mg total) by mouth daily., Disp: 90 tablet, Rfl: 3 .  buPROPion (WELLBUTRIN XL) 300 MG 24 hr tablet, Take 1 tablet (300 mg total) by mouth daily., Disp: 30 tablet, Rfl: 0 .  carvedilol (COREG) 25 MG tablet, TAKE 1 TABLET BY MOUTH  TWICE DAILY WITH A MEAL, Disp: 180 tablet, Rfl: 3 .  hydrochlorothiazide (HYDRODIURIL) 25 MG tablet, Take 1 tablet (25 mg total) by mouth daily., Disp: 90 tablet, Rfl: 3 .  vitamin B-12 (CYANOCOBALAMIN) 1000 MCG tablet, Take 1,000 mcg by mouth daily., Disp: , Rfl:  .  Vitamin D, Ergocalciferol, (DRISDOL) 1.25 MG (50000 UNIT) CAPS capsule, Take 50,000 Units by mouth every 7 (seven) days., Disp: , Rfl:  .  EPINEPHrine 0.3 mg/0.3 mL IJ SOAJ injection, , Disp: , Rfl:   Patient Active Problem List   Diagnosis Date Noted  . GAD (generalized anxiety disorder) 06/28/2019  . Elevated hematocrit 10/28/2018  . LVH (left ventricular hypertrophy) 04/08/2018  . Abnormal EKG 04/08/2018  . Essential hypertension, benign 03/30/2018  . Family history of ovarian cancer 10/26/2017  . Abnormal mammogram of left breast 09/04/2015  . Obesity, Class III, BMI 40-49.9 (morbid obesity) ([place]) 07/16/2015  . Depression, major, recurrent, in remission ([place]) 07/16/2015  . Vitamin D deficiency 07/16/2015  . Oral contraceptive pill surveillance 07/16/2015  . Breast cancer screening  07/16/2015    Past Surgical History:  Procedure Laterality Date  . BRAIN SURGERY      Family History  Problem Relation Age of Onset  . Heart murmur Mother   . Atrial fibrillation Mother   . Arthritis Father        RA  . Hyperlipidemia Father   . Cancer Maternal Aunt        ovarian and uterine   . Arthritis Maternal Grandmother   . Heart attack Maternal Grandfather   . Stroke Paternal Grandmother   . Arthritis Paternal Grandmother   . Breast cancer Paternal Grandmother   . Stroke Paternal Uncle     Social History   Tobacco Use  . Smoking status: Never Smoker  . Smokeless tobacco: Never Used  Vaping Use  . Vaping Use: Never used  Substance Use Topics  . Alcohol use: Yes    Alcohol/week: 2.0 standard drinks    Types: 2 Glasses of wine per week  . Drug use: No     Allergies  Allergen Reactions  . Ace Inhibitors Swelling  . Lisinopril Swelling  . Amoxicillin Hives and Swelling  . Benadryl [Diphenhydramine Hcl (Sleep)] Hives and Swelling    Health Maintenance  Topic Date Due  . Hepatitis C Screening  Never done  . COVID-19 Vaccine (1) Never done  . INFLUENZA VACCINE  07/22/2020  . PAP SMEAR-Modifier  10/13/2020  . MAMMOGRAM  02/16/2021  . TETANUS/TDAP  09/09/2026  . HIV Screening  Completed    Chart Review Today: I personally reviewed active problem list, medication list, allergies, family history, social history, health maintenance, notes from last encounter, lab results, imaging with the patient/caregiver today.   Review of Systems  10 Systems reviewed and are negative for acute change except as noted in the HPI.   Objective:   Vitals:   06/15/20 0756  BP: 122/82  Pulse: 79  Resp: 16  Temp: 98.1 F (36.7 C)  TempSrc: Temporal  SpO2: 99%  Weight: 289 lb 6.4 oz (131.3 kg)  Height: 5' 9.5" (1.765 m)    Body mass index is 42.12 kg/m.  Physical Exam Vitals and nursing note reviewed.  Constitutional:      General: She is not in acute  distress.    Appearance: Normal appearance. She is well-developed. She is obese. She is not ill-appearing, toxic-appearing or diaphoretic.     Interventions: Face mask in place.  HENT:     Head: Normocephalic and atraumatic.     Right Ear: External ear normal.     Left Ear: External ear normal.  Eyes:     General: Lids are normal. No scleral icterus.       Right eye: No discharge.        Left eye: No discharge.     Conjunctiva/sclera: Conjunctivae normal.  Neck:     Trachea: Phonation normal. No  tracheal deviation.  Cardiovascular:     Rate and Rhythm: Normal rate and regular rhythm.     Pulses: Normal pulses.          Radial pulses are 2+ on the right side and 2+ on the left side.       Posterior tibial pulses are 2+ on the right side and 2+ on the left side.     Heart sounds: Normal heart sounds. No murmur heard.  No friction rub. No gallop.   Pulmonary:     Effort: Pulmonary effort is normal. No respiratory distress.     Breath sounds: Normal breath sounds. No stridor. No wheezing, rhonchi or rales.  Chest:     Chest wall: No tenderness.  Abdominal:     General: Bowel sounds are normal. There is no distension.     Palpations: Abdomen is soft.  Musculoskeletal:     Right lower leg: No edema.     Left lower leg: No edema.  Skin:    General: Skin is warm and dry.     Coloration: Skin is not jaundiced or pale.     Findings: No rash.  Neurological:     Mental Status: She is alert.     Motor: No abnormal muscle tone.     Gait: Gait normal.  Psychiatric:        Mood and Affect: Mood normal.        Speech: Speech normal.        Behavior: Behavior normal.         Assessment & Plan:     ICD-10-CM   1. Essential hypertension, benign  I10 amLODipine (NORVASC) 5 MG tablet    carvedilol (COREG) 25 MG tablet    hydrochlorothiazide (HYDRODIURIL) 25 MG tablet    COMPLETE METABOLIC PANEL WITH GFR   Stable, well-controlled today  2. MDD (major depressive disorder),  recurrent, in partial remission (HCC)  F33.41 buPROPion (WELLBUTRIN XL) 300 MG 24 hr tablet   Well-controlled, refills on Wellbutrin  3. Obesity, Class III, BMI 40-49.9 (morbid obesity) (HCC)  I51.89 COMPLETE METABOLIC PANEL WITH GFR   He has been successful at losing weight with Noom, working on diet and exercise  4. LVH (left ventricular hypertrophy)  I51.7   5. Elevated hematocrit  R71.8    Recheck, most recent labs abnormal, not a smoker and no suspected sleep apnea but may have been dehydration?  6. Vitamin D deficiency  Q42.1 COMPLETE METABOLIC PANEL WITH GFR    VITAMIN D 25 Hydroxy (Vit-D Deficiency, Fractures)   Recheck labs, previously on prescription dose supplement  7. Fibroadenoma of breast, left  D24.2    Left breast mass, [facility] managing-fibroadenoma with apocrine metaplasia and pseudoangiomatosis stromal hyperplasia     Return for Annual Physical - asap.   Delsa Grana, PA-C 06/15/20 8:10 AM

## 2020-06-16 LAB — COMPLETE METABOLIC PANEL WITH GFR
AG Ratio: 1.7 (calc) (ref 1.0–2.5)
ALT: 14 U/L (ref 6–29)
AST: 13 U/L (ref 10–35)
Albumin: 4.1 g/dL (ref 3.6–5.1)
Alkaline phosphatase (APISO): 70 U/L (ref 31–125)
BUN: 12 mg/dL (ref 7–25)
CO2: 30 mmol/L (ref 20–32)
Calcium: 9.6 mg/dL (ref 8.6–10.2)
Chloride: 101 mmol/L (ref 98–110)
Creat: 0.84 mg/dL (ref 0.50–1.10)
GFR, Est African American: 97 mL/min/{1.73_m2} (ref >=60)
GFR, Est Non African American: 84 mL/min/{1.73_m2} (ref >=60)
Globulin: 2.4 g/dL (calc) (ref 1.9–3.7)
Glucose, Bld: 121 mg/dL — ABNORMAL HIGH (ref 65–99)
Potassium: 4.9 mmol/L (ref 3.5–5.3)
Sodium: 138 mmol/L (ref 135–146)
Total Bilirubin: 0.6 mg/dL (ref 0.2–1.2)
Total Protein: 6.5 g/dL (ref 6.1–8.1)

## 2020-06-16 LAB — VITAMIN D 25 HYDROXY (VIT D DEFICIENCY, FRACTURES): Vit D, 25-Hydroxy: 31 ng/mL (ref 30–100)

## 2020-06-18 ENCOUNTER — Encounter: Payer: Self-pay | Admitting: Family Medicine

## 2020-06-18 DIAGNOSIS — G4733 Obstructive sleep apnea (adult) (pediatric): Secondary | ICD-10-CM | POA: Insufficient documentation

## 2020-07-24 ENCOUNTER — Other Ambulatory Visit (HOSPITAL_COMMUNITY)
Admission: RE | Admit: 2020-07-24 | Discharge: 2020-07-24 | Disposition: A | Discharge status: Home / Self Care | Payer: 59 | Source: Ambulatory Visit | Attending: Family Medicine | Admitting: Family Medicine

## 2020-07-24 ENCOUNTER — Ambulatory Visit (INDEPENDENT_AMBULATORY_CARE_PROVIDER_SITE_OTHER): Payer: 59 | Admitting: Family Medicine

## 2020-07-24 ENCOUNTER — Other Ambulatory Visit: Payer: Self-pay

## 2020-07-24 ENCOUNTER — Encounter: Payer: Self-pay | Admitting: Family Medicine

## 2020-07-24 VITALS — BP 118/76 | HR 86 | Temp 97.8°F | Resp 18 | Ht 70.0 in | Wt 286.1 lb

## 2020-07-24 DIAGNOSIS — Z124 Encounter for screening for malignant neoplasm of cervix: Secondary | ICD-10-CM | POA: Insufficient documentation

## 2020-07-24 DIAGNOSIS — E079 Disorder of thyroid, unspecified: Secondary | ICD-10-CM

## 2020-07-24 DIAGNOSIS — Z13220 Encounter for screening for lipoid disorders: Secondary | ICD-10-CM

## 2020-07-24 DIAGNOSIS — I10 Essential (primary) hypertension: Secondary | ICD-10-CM

## 2020-07-24 DIAGNOSIS — Z1211 Encounter for screening for malignant neoplasm of colon: Secondary | ICD-10-CM

## 2020-07-24 DIAGNOSIS — Z1159 Encounter for screening for other viral diseases: Secondary | ICD-10-CM

## 2020-07-24 DIAGNOSIS — Z0000 Encounter for general adult medical examination without abnormal findings: Secondary | ICD-10-CM | POA: Diagnosis not present

## 2020-07-24 NOTE — Patient Instructions (Addendum)
Preventive Care 40-45 Years Old, Female Preventive care refers to visits with your health care provider and lifestyle choices that can promote health and wellness. This includes:  A yearly physical exam. This may also be called an annual well check.  Regular dental visits and eye exams.  Immunizations.  Screening for certain conditions.  Healthy lifestyle choices, such as eating a healthy diet, getting regular exercise, not using drugs or products that contain nicotine and tobacco, and limiting alcohol use. What can I expect for my preventive care visit? Physical exam Your health care provider will check your:  Height and weight. This may be used to calculate body mass index (BMI), which tells if you are at a healthy weight.  Heart rate and blood pressure.  Skin for abnormal spots. Counseling Your health care provider may ask you questions about your:  Alcohol, tobacco, and drug use.  Emotional well-being.  Home and relationship well-being.  Sexual activity.  Eating habits.  Work and work environment.  Method of birth control.  Menstrual cycle.  Pregnancy history. What immunizations do I need?  Influenza (flu) vaccine  This is recommended every year. Tetanus, diphtheria, and pertussis (Tdap) vaccine  You may need a Td booster every 10 years. Varicella (chickenpox) vaccine  You may need this if you have not been vaccinated. Zoster (shingles) vaccine  You may need this after age 60. Measles, mumps, and rubella (MMR) vaccine  You may need at least one dose of MMR if you were born in 1957 or later. You may also need a second dose. Pneumococcal conjugate (PCV13) vaccine  You may need this if you have certain conditions and were not previously vaccinated. Pneumococcal polysaccharide (PPSV23) vaccine  You may need one or two doses if you smoke cigarettes or if you have certain conditions. Meningococcal conjugate (MenACWY) vaccine  You may need this if you  have certain conditions. Hepatitis A vaccine  You may need this if you have certain conditions or if you travel or work in places where you may be exposed to hepatitis A. Hepatitis B vaccine  You may need this if you have certain conditions or if you travel or work in places where you may be exposed to hepatitis B. Haemophilus influenzae type b (Hib) vaccine  You may need this if you have certain conditions. Human papillomavirus (HPV) vaccine  If recommended by your health care provider, you may need three doses over 6 months. You may receive vaccines as individual doses or as more than one vaccine together in one shot (combination vaccines). Talk with your health care provider about the risks and benefits of combination vaccines. What tests do I need? Blood tests  Lipid and cholesterol levels. These may be checked every 5 years, or more frequently if you are over 50 years old.  Hepatitis C test.  Hepatitis B test. Screening  Lung cancer screening. You may have this screening every year starting at age 55 if you have a 30-pack-year history of smoking and currently smoke or have quit within the past 15 years.  Colorectal cancer screening. All adults should have this screening starting at age 50 and continuing until age 75. Your health care provider may recommend screening at age 45 if you are at increased risk. You will have tests every 1-10 years, depending on your results and the type of screening test.  Diabetes screening. This is done by checking your blood sugar (glucose) after you have not eaten for a while (fasting). You may have this   done every 1-3 years.  Mammogram. This may be done every 1-2 years. Talk with your health care provider about when you should start having regular mammograms. This may depend on whether you have a family history of breast cancer.  BRCA-related cancer screening. This may be done if you have a family history of breast, ovarian, tubal, or peritoneal  cancers.  Pelvic exam and Pap test. This may be done every 3 years starting at age 21. Starting at age 30, this may be done every 5 years if you have a Pap test in combination with an HPV test. Other tests  Sexually transmitted disease (STD) testing.  Bone density scan. This is done to screen for osteoporosis. You may have this scan if you are at high risk for osteoporosis. Follow these instructions at home: Eating and drinking  Eat a diet that includes fresh fruits and vegetables, whole grains, lean protein, and low-fat dairy.  Take vitamin and mineral supplements as recommended by your health care provider.  Do not drink alcohol if: ? Your health care provider tells you not to drink. ? You are pregnant, may be pregnant, or are planning to become pregnant.  If you drink alcohol: ? Limit how much you have to 0-1 drink a day. ? Be aware of how much alcohol is in your drink. In the [place] one drink equals one 12 oz bottle of beer (355 mL), one 5 oz glass of wine (148 mL), or one 1 oz glass of hard liquor (44 mL). Lifestyle  Take daily care of your teeth and gums.  Stay active. Exercise for at least 30 minutes on 5 or more days each week.  Do not use any products that contain nicotine or tobacco, such as cigarettes, e-cigarettes, and chewing tobacco. If you need help quitting, ask your health care provider.  If you are sexually active, practice safe sex. Use a condom or other form of birth control (contraception) in order to prevent pregnancy and STIs (sexually transmitted infections).  If told by your health care provider, take low-dose aspirin daily starting at age 50. What's next?  Visit your health care provider once a year for a well check visit.  Ask your health care provider how often you should have your eyes and teeth checked.  Stay up to date on all vaccines. This information is not intended to replace advice given to you by your health care provider. Make sure you  discuss any questions you have with your health care provider. Document Revised: 08/19/2018 Document Reviewed: 08/19/2018 Elsevier Patient Education  2020 [employer]   Preventing Osteoporosis, Adult Osteoporosis is a condition that causes the bones to lose density. This means that the bones become thinner, and the normal spaces in bone tissue become larger. Low bone density can make the bones weak and cause them to break more easily. Osteoporosis cannot always be prevented, but you can take steps to lower your risk of developing this condition. How can this condition affect me? If you develop osteoporosis, you will be more likely to break bones in your wrist, spine, or hip. Even a minor accident or injury can be enough to break weak bones. The bones will also be slower to heal. Osteoporosis can cause other problems as well, such as a stooped posture or trouble with movement. Osteoporosis can occur with aging. As you get older, you may lose bone tissue more quickly, or it may be replaced more slowly. Osteoporosis is more likely to develop if you have   poor nutrition or do not get enough calcium or vitamin D. Other lifestyle factors can also play a role. By eating a well-balanced diet and making lifestyle changes, you can help keep your bones strong and healthy, lowering your chances of developing osteoporosis. What can increase my risk? The following factors may make you more likely to develop osteoporosis:  Having a family history of the condition.  Having poor nutrition or not getting enough calcium or vitamin D.  Using certain medicines, such as steroid medicines or antiseizure medicines.  Being any of the following: ? 50 years of age or older. ? Female. ? A woman who has gone through menopause (is postmenopausal). ? White (Caucasian) or of Asian descent.  Smoking or having a history of smoking.  Not being physically active (being sedentary).  Having a small body frame. What  actions can I take to prevent this?  Get enough calcium   Make sure you get enough calcium every day. Calcium is the most important mineral for bone health. Most people can get enough calcium from their diet, but supplements may be recommended for people who are at risk for osteoporosis. Follow these guidelines: ? If you are age 50 or younger, aim to get 1,000 mg of calcium every day. ? If you are older than age 50, aim to get 1,200 mg of calcium every day.  Good sources of calcium include: ? Dairy products, such as low-fat or nonfat milk, cheese, and yogurt. ? Dark green leafy vegetables, such as bok choy and broccoli. ? Foods that have had calcium added to them (calcium-fortified foods), such as orange juice, cereal, bread, soy beverages, and tofu products. ? Nuts, such as almonds.  Check nutrition labels to see how much calcium is in a food or drink. Get enough vitamin D  Try to get enough vitamin D every day. Vitamin D is the most essential vitamin for bone health. It helps the body absorb calcium. Follow these guidelines for how much vitamin D to get from food: ? If you are age 70 or younger, aim to get at least 600 international units (IU) every day. Your health care provider may suggest more. ? If you are older than age 70, aim to get at least 800 international units every day. Your health care provider may suggest more.  Good sources of vitamin D in your diet include: ? Egg yolks. ? Oily fish, such as salmon, sardines, and tuna. ? Milk and cereal fortified with vitamin D.  Your body also makes vitamin D when you are out in the sun. Exposing the bare skin on your face, arms, legs, or back to the sun for no more than 30 minutes a day, 2 times a week is more than enough. Beyond that, make sure you use sunblock to protect your skin from sunburn, which increases your risk for skin cancer. Exercise  Stay active and get exercise every day.  Ask your health care provider what types of  exercise are best for you. Weight-bearing and strength-building activities are important for building and maintaining healthy bones. Some examples of these types of activities include: ? Walking and hiking. ? Jogging and running. ? Dancing. ? Gym exercises. ? Lifting weights. ? Tennis and racquetball. ? Climbing stairs. ? Aerobics. Make other lifestyle changes  Do not use any products that contain nicotine or tobacco, such as cigarettes, e-cigarettes, and chewing tobacco. If you need help quitting, ask your health care provider.  Lose weight if you are overweight.    If you drink alcohol: ? Limit how much you use to:  0-1 drink a day for nonpregnant women.  0-2 drinks a day for men. ? Be aware of how much alcohol is in your drink. In the [place] one drink equals one 12 oz bottle of beer (355 mL), one 5 oz glass of wine (148 mL), or one 1 oz glass of hard liquor (44 mL). Where to find support If you need help making changes to prevent osteoporosis, talk with your health care provider. You can ask for a referral to a diet and nutrition specialist (dietitian) and a physical therapist. Where to find more information Learn more about osteoporosis from:  NIH Osteoporosis and Related Bone Diseases National Resource Center: www.bones.nih.gov  [place] Office on Women's Health: www.womenshealth.gov  National Osteoporosis Foundation: www.nof.org Summary  Osteoporosis is a condition that causes weak bones that are more likely to break.  Eat a healthy diet, making sure you get enough calcium and vitamin D, and stay active by getting regular exercise to help prevent osteoporosis.  Other ways to reduce your risk of osteoporosis include maintaining a healthy weight and avoiding alcohol and products that contain nicotine or tobacco. This information is not intended to replace advice given to you by your health care provider. Make sure you discuss any questions you have with your health care  provider. Document Revised: 07/08/2019 Document Reviewed: 07/08/2019 [employer] Patient Education  2020 [employer]  

## 2020-07-24 NOTE — Progress Notes (Signed)
Patient: Natalie Petersen, Female    DOB: February 21, 1976, 44 y.o.   MRN: [id number] Delsa Grana, PA-C Visit Date: 07/24/2020  Today's Provider: Delsa Grana, PA-C   Chief Complaint  Patient presents with  . Annual Exam    with pap   Subjective:   Annual physical exam:  Natalie Petersen is a 44 y.o. female who presents today for complete physical exam:  Exercise/Activity:  Walking 5-7 d a week 40 min Diet/nutrition:   Noom Sleep:  Sleeping well - compliant with cpap   USPSTF grade A and B recommendations - reviewed and addressed today  Depression:  Phq 9 completed today by patient, was reviewed by me with patient in the room PHQ score is neg, pt feels good PHQ 2/9 Scores 07/24/2020 06/15/2020 12/06/2019 10/28/2018  PHQ - 2 Score 0 0 0 0  PHQ- 9 Score 0 0 0 6  Exception Documentation - - - -   Depression screen [facility] 2/9 07/24/2020 06/15/2020 12/06/2019 10/28/2018 07/28/2018  Decreased Interest 0 0 0 0 0  Down, Depressed, Hopeless 0 0 0 0 0  PHQ - 2 Score 0 0 0 0 0  Altered sleeping 0 0 0 3 0  Tired, decreased energy 0 0 0 3 1  Change in appetite 0 0 0 0 0  Feeling bad or failure about yourself  0 0 0 0 0  Trouble concentrating 0 0 0 0 0  Moving slowly or fidgety/restless 0 0 0 0 0  Suicidal thoughts 0 0 0 0 0  PHQ-9 Score 0 0 0 6 1  Difficult doing work/chores Not difficult at all Not difficult at all Not difficult at all Not difficult at all Not difficult at all    Alcohol screening:   Office Visit from 07/24/2020 in [facility]  AUDIT-C Score 1      Immunizations and Health Maintenance: Health Maintenance  Topic Date Due  . Hepatitis C Screening  Never done  . COVID-19 Vaccine (1) Never done  . INFLUENZA VACCINE  07/22/2020  . PAP SMEAR-Modifier  10/13/2020  . MAMMOGRAM  02/16/2021  . TETANUS/TDAP  09/09/2026  . HIV Screening  Completed     Hep C Screening: due   STD testing and prevention (HIV/chl/gon/syphilis):  see above, no additional testing  desired by pt today  Intimate partner violence:  Safe, denies  Sexual History/Pain during Intercourse: Married  Menstrual History/LMP/Abnormal Bleeding:   Regular cycle, lasts 4-5 d, 2-3 heavy days (changing tampon/pad every 3-4 hours) and 2 light days Patient's last menstrual period was 07/04/2020.  Incontinence Symptoms: none  Breast cancer: done/UTD Last Mammogram: *see HM list above BRCA gene screening: none done [facility]  Cervical cancer screening: due - she had one abnormal pap and then it was repeated and normal - due today Pt denies first degree family hx of cancers - breast, ovarian, uterine, colon:    Maternal aunt with ovarian and uterine cancer  Osteoporosis:   Discussion on osteoporosis per age, including high calcium and vitamin D supplementation, weight bearing exercises Pt is  supplementing with daily calcium/Vit D.  Skin cancer:  Hx of skin CA -  NO Discussed atypical lesions   Colorectal cancer:   Colonoscopy is due - wishes to try cologuard  Discussed concerning signs and sx of CRC, pt denies melena hematochezia Low risk cologuard   Lung cancer:   Low Dose CT Chest recommended if Age 36-80 years, 30 pack-year currently smoking OR have quit w/in  15years. Patient does not qualify.    Social History   Tobacco Use  . Smoking status: Never Smoker  . Smokeless tobacco: Never Used  Vaping Use  . Vaping Use: Never used  Substance Use Topics  . Alcohol use: Yes    Alcohol/week: 2.0 standard drinks    Types: 2 Glasses of wine per week  . Drug use: No       Office Visit from 07/24/2020 in [facility]  AUDIT-C Score 1      Family History  Problem Relation Age of Onset  . Heart murmur Mother   . Atrial fibrillation Mother   . Arthritis Father        RA  . Hyperlipidemia Father   . Cancer Maternal Aunt        ovarian and uterine   . Arthritis Maternal Grandmother   . Heart attack Maternal Grandfather   . Stroke Paternal Grandmother    . Arthritis Paternal Grandmother   . Breast cancer Paternal Grandmother   . Stroke Paternal Uncle      Blood pressure/Hypertension: BP Readings from Last 3 Encounters:  07/24/20 118/76  06/15/20 122/82  03/26/20 137/85    Weight/Obesity: Wt Readings from Last 3 Encounters:  07/24/20 286 lb 1.6 oz (129.8 kg)  06/15/20 289 lb 6.4 oz (131.3 kg)  03/26/20 293 lb 9.6 oz (133.2 kg)   BMI Readings from Last 3 Encounters:  07/24/20 41.05 kg/m  06/15/20 42.12 kg/m  03/26/20 42.74 kg/m     Lipids:  Lab Results  Component Value Date   CHOL 199 02/11/2018   CHOL 228 (H) 10/13/2017   CHOL 214 (H) 09/09/2016   Lab Results  Component Value Date   HDL 71 02/11/2018   HDL 83 10/13/2017   HDL 85 09/09/2016   Lab Results  Component Value Date   LDLCALC 104 (H) 02/11/2018   LDLCALC 127 (H) 10/13/2017   LDLCALC 109 09/09/2016   Lab Results  Component Value Date   TRIG 121 02/11/2018   TRIG 84 10/13/2017   TRIG 100 09/09/2016   Lab Results  Component Value Date   CHOLHDL 2.7 10/13/2017   CHOLHDL 2.5 09/09/2016   No results found for: LDLDIRECT Based on the results of lipid panel his/her cardiovascular risk factor ( using [place] )  in the next 10 years is: The 10-year ASCVD risk score Mikey Bussing DC Brooke Bonito., et al., 2013) is: 0.6%   Values used to calculate the score:     Age: 47 years     Sex: Female     Is Non-Hispanic African American: No     Diabetic: No     Tobacco smoker: No     Systolic Blood Pressure: 349 mmHg     Is BP treated: Yes     HDL Cholesterol: 71 mg/dL     Total Cholesterol: 199 mg/dL Glucose:  Glucose  Date Value Ref Range Status  04/20/2018 110 (H) 65 - 99 mg/dL Final   Glucose, Bld  Date Value Ref Range Status  06/15/2020 121 (H) 65 - 99 mg/dL Final    Comment:    .            Fasting reference interval . For someone without known diabetes, a glucose value between 100 and 125 mg/dL is consistent with prediabetes and should be confirmed  with a follow-up test. .   10/28/2018 107 (H) 65 - 99 mg/dL Final    Comment:    .  Fasting reference interval . For someone without known diabetes, a glucose value between 100 and 125 mg/dL is consistent with prediabetes and should be confirmed with a follow-up test. .   03/15/2018 152 (H) 65 - 99 mg/dL Final   Glucose-Capillary  Date Value Ref Range Status  03/15/2018 93 65 - 99 mg/dL Final   Hypertension: BP Readings from Last 3 Encounters:  07/24/20 118/76  06/15/20 122/82  03/26/20 137/85   Obesity: Wt Readings from Last 3 Encounters:  07/24/20 286 lb 1.6 oz (129.8 kg)  06/15/20 289 lb 6.4 oz (131.3 kg)  03/26/20 293 lb 9.6 oz (133.2 kg)   BMI Readings from Last 3 Encounters:  07/24/20 41.05 kg/m  06/15/20 42.12 kg/m  03/26/20 42.74 kg/m      Advanced Care Planning:  A voluntary discussion about advance care planning including the explanation and discussion of advance directives.    Social History      She        Social History   Socioeconomic History  . Marital status: Married    Spouse name: Maximo Donaire  . Number of children: 1  . Years of education: Not on file  . Highest education level: Master's degree (e.g., MA, MS, MEng, MEd, MSW, MBA)  Occupational History    Comment: full time  Tobacco Use  . Smoking status: Never Smoker  . Smokeless tobacco: Never Used  Vaping Use  . Vaping Use: Never used  Substance and Sexual Activity  . Alcohol use: Yes    Alcohol/week: 2.0 standard drinks    Types: 2 Glasses of wine per week  . Drug use: No  . Sexual activity: Yes    Partners: Male    Birth control/protection: Pill  Other Topics Concern  . Not on file  Social History Narrative  . Not on file   Social Determinants of Health   Financial Resource Strain:   . Difficulty of Paying Living Expenses:   Food Insecurity:   . Worried About Charity fundraiser in the Last Year:   . Arboriculturist in the Last Year:     Transportation Needs:   . Film/video editor (Medical):   Marland Kitchen Lack of Transportation (Non-Medical):   Physical Activity:   . Days of Exercise per Week:   . Minutes of Exercise per Session:   Stress:   . Feeling of Stress :   Social Connections:   . Frequency of Communication with Friends and Family:   . Frequency of Social Gatherings with Friends and Family:   . Attends Religious Services:   . Active Member of Clubs or Organizations:   . Attends Archivist Meetings:   Marland Kitchen Marital Status:     Family History        Family History  Problem Relation Age of Onset  . Heart murmur Mother   . Atrial fibrillation Mother   . Arthritis Father        RA  . Hyperlipidemia Father   . Cancer Maternal Aunt        ovarian and uterine   . Arthritis Maternal Grandmother   . Heart attack Maternal Grandfather   . Stroke Paternal Grandmother   . Arthritis Paternal Grandmother   . Breast cancer Paternal Grandmother   . Stroke Paternal Uncle     Patient Active Problem List   Diagnosis Date Noted  . OSA (obstructive sleep apnea) 06/18/2020  . GAD (generalized anxiety disorder) 06/28/2019  . Elevated hematocrit 10/28/2018  .  LVH (left ventricular hypertrophy) 04/08/2018  . Abnormal EKG 04/08/2018  . Essential hypertension, benign 03/30/2018  . Family history of ovarian cancer 10/26/2017  . Abnormal mammogram of left breast 09/04/2015  . Obesity, Class III, BMI 40-49.9 (morbid obesity) ([place]) 07/16/2015  . Depression, major, recurrent, in remission ([place]) 07/16/2015  . Vitamin D deficiency 07/16/2015  . Oral contraceptive pill surveillance 07/16/2015  . Breast cancer screening 07/16/2015    Past Surgical History:  Procedure Laterality Date  . BRAIN SURGERY       Current Outpatient Medications:  .  amLODipine (NORVASC) 5 MG tablet, Take 1 tablet (5 mg total) by mouth daily., Disp: 90 tablet, Rfl: 3 .  buPROPion (WELLBUTRIN XL) 300 MG 24 hr tablet, Take 1 tablet (300 mg  total) by mouth daily., Disp: 90 tablet, Rfl: 3 .  carvedilol (COREG) 25 MG tablet, TAKE 1 TABLET BY MOUTH  TWICE DAILY WITH A MEAL, Disp: 180 tablet, Rfl: 3 .  cholecalciferol (VITAMIN D3) 25 MCG (1000 UNIT) tablet, Take 1,000 Units by mouth daily., Disp: , Rfl:  .  hydrochlorothiazide (HYDRODIURIL) 25 MG tablet, Take 1 tablet (25 mg total) by mouth daily., Disp: 90 tablet, Rfl: 3 .  vitamin B-12 (CYANOCOBALAMIN) 1000 MCG tablet, Take 1,000 mcg by mouth daily., Disp: , Rfl:  .  EPINEPHrine 0.3 mg/0.3 mL IJ SOAJ injection, , Disp: , Rfl:   Allergies  Allergen Reactions  . Ace Inhibitors Swelling  . Lisinopril Swelling  . Amoxicillin Hives and Swelling  . Benadryl [Diphenhydramine Hcl (Sleep)] Hives and Swelling    Patient Care Team: Delsa Grana, PA-C as PCP - General (Family Medicine) Eber Jones, MD as Consulting Physician [facility])  Review of Systems  Constitutional: Negative.  Negative for activity change, appetite change, fatigue and unexpected weight change.  HENT: Negative.   Eyes: Negative.   Respiratory: Negative.  Negative for shortness of breath.   Cardiovascular: Negative.  Negative for chest pain, palpitations and leg swelling.  Gastrointestinal: Negative.  Negative for abdominal pain and blood in stool.  Endocrine: Negative.   Genitourinary: Negative.   Musculoskeletal: Negative.  Negative for arthralgias, gait problem, joint swelling and myalgias.  Skin: Negative.  Negative for color change, pallor and rash.  Allergic/Immunologic: Negative.   Neurological: Negative.  Negative for syncope and weakness.  Hematological: Negative.   Psychiatric/Behavioral: Negative.  Negative for confusion, dysphoric mood, self-injury and suicidal ideas. The patient is not nervous/anxious.      I personally reviewed active problem list, medication list, allergies, family history, social history, health maintenance, notes from last encounter, lab results, imaging with the  patient/caregiver today.        Objective:   Vitals:  Vitals:   07/24/20 0833  BP: 118/76  Pulse: 86  Resp: 18  Temp: 97.8 F (36.6 C)  TempSrc: Temporal  SpO2: 97%  Weight: 286 lb 1.6 oz (129.8 kg)  Height: 5' 10" (1.778 m)    Body mass index is 41.05 kg/m.  Physical Exam Vitals and nursing note reviewed. Exam conducted with a chaperone present.  Constitutional:      General: She is not in acute distress.    Appearance: Normal appearance. She is well-developed. She is obese. She is not ill-appearing, toxic-appearing or diaphoretic.  HENT:     Head: Normocephalic and atraumatic.     Right Ear: External ear normal.     Left Ear: External ear normal.     Nose: Nose normal.     Mouth/Throat:  Pharynx: Uvula midline.  Eyes:     General: Lids are normal.     Conjunctiva/sclera: Conjunctivae normal.     Pupils: Pupils are equal, round, and reactive to light.  Neck:     Trachea: Phonation normal. No tracheal deviation.  Cardiovascular:     Rate and Rhythm: Normal rate and regular rhythm.     Pulses: Normal pulses.          Radial pulses are 2+ on the right side and 2+ on the left side.       Posterior tibial pulses are 2+ on the right side and 2+ on the left side.     Heart sounds: Normal heart sounds. No murmur heard.  No friction rub. No gallop.   Pulmonary:     Effort: Pulmonary effort is normal. No respiratory distress.     Breath sounds: Normal breath sounds. No stridor. No wheezing, rhonchi or rales.  Chest:     Chest wall: No tenderness.     Comments: Declined breast exam Abdominal:     General: Bowel sounds are normal. There is no distension.     Palpations: Abdomen is soft.     Tenderness: There is no abdominal tenderness. There is no guarding or rebound.  Genitourinary:    General: Normal vulva.     Vagina: No signs of injury. Vaginal discharge present. No erythema, tenderness, bleeding or lesions.     Cervix: Friability present. No cervical motion  tenderness, discharge, lesion, erythema or cervical bleeding.     Uterus: Normal.      Adnexa: Right adnexa normal and left adnexa normal.     Comments: PAP obtained Musculoskeletal:        General: No deformity. Normal range of motion.     Cervical back: Normal range of motion and neck supple.  Lymphadenopathy:     Cervical: No cervical adenopathy.  Skin:    General: Skin is warm and dry.     Capillary Refill: Capillary refill takes less than 2 seconds.     Coloration: Skin is not pale.     Findings: No rash.  Neurological:     Mental Status: She is alert and oriented to person, place, and time.     Motor: No abnormal muscle tone.     Gait: Gait normal.  Psychiatric:        Speech: Speech normal.        Behavior: Behavior normal.       Fall Risk: Fall Risk  07/24/2020 06/15/2020 03/26/2020 12/06/2019 10/28/2018  Falls in the past year? 0 0 0 0 0  Number falls in past yr: 0 0 0 0 -  Injury with Fall? 0 0 0 0 -  Follow up Follow up appointment - - Falls evaluation completed -    Functional Status Survey: Is the patient deaf or have difficulty hearing?: No Does the patient have difficulty seeing, even when wearing glasses/contacts?: No Does the patient have difficulty concentrating, remembering, or making decisions?: No Does the patient have difficulty walking or climbing stairs?: No Does the patient have difficulty dressing or bathing?: No Does the patient have difficulty doing errands alone such as visiting a doctor's office or shopping?: No   Assessment & Plan:    CPE completed today  . USPSTF grade A and B recommendations reviewed with patient; age-appropriate recommendations, preventive care, screening tests, etc discussed and encouraged; healthy living encouraged; see AVS for patient education given to patient  . Discussed importance of 150  minutes of physical activity weekly, AHA exercise recommendations given to pt in AVS/handout  . Discussed importance of healthy  diet:  eating lean meats and proteins, avoiding trans fats and saturated fats, avoid simple sugars and excessive carbs in diet, eat 6 servings of fruit/vegetables daily and drink plenty of water and avoid sweet beverages.    . Recommended pt to do annual eye exam and routine dental exams/cleanings  . Depression, alcohol, fall screening completed as documented above and per flowsheets  . Reviewed Health Maintenance: Health Maintenance  Topic Date Due  . Hepatitis C Screening  Never done  . COVID-19 Vaccine (1) Never done  . INFLUENZA VACCINE  07/22/2020  . PAP SMEAR-Modifier  10/13/2020  . MAMMOGRAM  02/16/2021  . TETANUS/TDAP  09/09/2026  . HIV Screening  Completed    . Immunizations: Immunization History  Administered Date(s) Administered  . Influenza-Unspecified 10/12/2017  . Tdap 09/09/2016      ICD-10-CM   1. Physical exam, annual  A63.01 COMPLETE METABOLIC PANEL WITH GFR    CBC w/Diff/Platelet    Lipid panel  2. Obesity, Class III, BMI 40-49.9 (morbid obesity) (HCC)  S01.09 COMPLETE METABOLIC PANEL WITH GFR    CBC w/Diff/Platelet    Lipid panel    TSH    T4, free    CANCELED: T4, free    CANCELED: TSH   she has been loosing weight and working very hard on diet and lifestyle efforts, she has hit a plateu a little with weight  3. Essential hypertension, benign  N23 COMPLETE METABOLIC PANEL WITH GFR   stable, well controlled  4. Lipid screening  Z13.220 Lipid panel  5. Screening for cervical cancer  Z12.4 Cytology - PAP   PAP and bimanual done - some noted white discharge and mild irritation - suspect some yeast, but pt asx  6. Encounter for hepatitis C screening test for low risk patient  Z11.59 Hepatitis C Antibody  7. Thyroid disease  E07.9 TSH    T4, free    CANCELED: T4, free    CANCELED: TSH   hx of thyroid disease she would like rechecked, not currently on meds, having trouble looking weight and tired  8. Screening for malignant neoplasm of colon  Z12.11  Cologuard   discussed change in screening recommendations, she prefers cologuard - encouraged pt to check for insurance coverage, low risk, cologuard ordered     Delsa Grana, PA-C 07/24/20 8:51 AM  [facility]

## 2020-07-25 ENCOUNTER — Encounter: Payer: Self-pay | Admitting: Family Medicine

## 2020-07-25 LAB — CBC WITH DIFFERENTIAL/PLATELET
Absolute Monocytes: 554 cells/uL (ref 200–950)
Basophils Absolute: 50 cells/uL (ref 0–200)
Basophils Relative: 0.6 %
Eosinophils Absolute: 92 cells/uL (ref 15–500)
Eosinophils Relative: 1.1 %
HCT: 44 % (ref 35.0–45.0)
Hemoglobin: 14.6 g/dL (ref 11.7–15.5)
Lymphs Abs: 2243 cells/uL (ref 850–3900)
MCH: 28.6 pg (ref 27.0–33.0)
MCHC: 33.2 g/dL (ref 32.0–36.0)
MCV: 86.1 fL (ref 80.0–100.0)
MPV: 9.8 fL (ref 7.5–12.5)
Monocytes Relative: 6.6 %
Neutro Abs: 5460 cells/uL (ref 1500–7800)
Neutrophils Relative %: 65 %
Platelets: 362 10*3/uL (ref 140–400)
RBC: 5.11 10*6/uL — ABNORMAL HIGH (ref 3.80–5.10)
RDW: 13.4 % (ref 11.0–15.0)
Total Lymphocyte: 26.7 %
WBC: 8.4 10*3/uL (ref 3.8–10.8)

## 2020-07-25 LAB — COMPLETE METABOLIC PANEL WITH GFR
AG Ratio: 1.6 (calc) (ref 1.0–2.5)
ALT: 13 U/L (ref 6–29)
AST: 13 U/L (ref 10–35)
Albumin: 4.3 g/dL (ref 3.6–5.1)
Alkaline phosphatase (APISO): 68 U/L (ref 31–125)
BUN: 13 mg/dL (ref 7–25)
CO2: 29 mmol/L (ref 20–32)
Calcium: 9.5 mg/dL (ref 8.6–10.2)
Chloride: 99 mmol/L (ref 98–110)
Creat: 0.9 mg/dL (ref 0.50–1.10)
GFR, Est African American: 89 mL/min/{1.73_m2} (ref >=60)
GFR, Est Non African American: 77 mL/min/{1.73_m2} (ref >=60)
Globulin: 2.7 g/dL (calc) (ref 1.9–3.7)
Glucose, Bld: 109 mg/dL — ABNORMAL HIGH (ref 65–99)
Potassium: 3.9 mmol/L (ref 3.5–5.3)
Sodium: 137 mmol/L (ref 135–146)
Total Bilirubin: 0.5 mg/dL (ref 0.2–1.2)
Total Protein: 7 g/dL (ref 6.1–8.1)

## 2020-07-25 LAB — T4, FREE: Free T4: 1.1 ng/dL (ref 0.8–1.8)

## 2020-07-25 LAB — LIPID PANEL
Cholesterol: 200 mg/dL — ABNORMAL HIGH (ref <200)
HDL: 67 mg/dL (ref >=50)
LDL Cholesterol (Calc): 109 mg/dL (calc) — ABNORMAL HIGH
Non-HDL Cholesterol (Calc): 133 mg/dL (calc) — ABNORMAL HIGH (ref <130)
Total CHOL/HDL Ratio: 3 (calc) (ref <5.0)
Triglycerides: 129 mg/dL (ref <150)

## 2020-07-25 LAB — HEPATITIS C ANTIBODY
Hepatitis C Ab: NONREACTIVE
SIGNAL TO CUT-OFF: 0.01 (ref <1.00)

## 2020-07-25 LAB — TSH: TSH: 4.69 mIU/L — ABNORMAL HIGH

## 2020-07-29 LAB — CYTOLOGY - PAP
Comment: NEGATIVE
Diagnosis: NEGATIVE
High risk HPV: NEGATIVE

## 2020-07-30 ENCOUNTER — Other Ambulatory Visit: Payer: Self-pay | Admitting: Family Medicine

## 2020-07-30 DIAGNOSIS — E039 Hypothyroidism, unspecified: Secondary | ICD-10-CM

## 2020-07-30 MED ORDER — LEVOTHYROXINE SODIUM 100 MCG PO TABS: 100.0000 ug | ORAL_TABLET | Freq: Every day | ORAL | 3 refills | Status: DC

## 2020-07-30 MED ORDER — LEVOTHYROXINE SODIUM 25 MCG PO TABS: ORAL_TABLET | ORAL | 0 refills | Status: DC

## 2020-08-29 ENCOUNTER — Other Ambulatory Visit: Payer: Self-pay

## 2020-08-29 ENCOUNTER — Encounter: Payer: Self-pay | Admitting: Adult Health

## 2020-08-29 ENCOUNTER — Ambulatory Visit (INDEPENDENT_AMBULATORY_CARE_PROVIDER_SITE_OTHER): Payer: 59 | Admitting: Adult Health

## 2020-08-29 DIAGNOSIS — E66813 Obesity, class 3: Secondary | ICD-10-CM

## 2020-08-29 DIAGNOSIS — G4733 Obstructive sleep apnea (adult) (pediatric): Secondary | ICD-10-CM

## 2020-08-29 NOTE — Assessment & Plan Note (Signed)
Encouraged on healthy weight loss  Plan  Patient Instructions  Keep up the good work Continue on CPAP at bedtime Continue to be active and work on healthy weight loss Do not drive if sleepy Follow-up in 1 year with Dr. Mortimer Fries and As needed

## 2020-08-29 NOTE — Progress Notes (Signed)
@Patient  ID: Natalie Petersen, female    DOB: 06/25/1976, 44 y.o.   MRN: [id number]  Chief Complaint  Patient presents with  . Follow-up    OSA     Referring provider: Delsa Grana, PA-C  HPI: 44 year old female followed for obstructive sleep apnea and bruxism  TEST/EVENTS :  HST 2019>> mild OSA with AHI of 10.  Recommended auto CPAP with pressure range 5-15.  08/29/2020 Follow up : OSA  Patient returns for a 1 year follow-up for sleep apnea.  Patient has mild sleep apnea is on nocturnal CPAP.  Patient also has bruxism and wears a night mouthguard.  Patient says overall is doing well on CPAP.  Tries to wear her CPAP each night.  Usually gets in about 6 hours.  Has been having trouble with full face mask, causing pressure along cheek bones, has to tighten straps to get good seal. Takes off early morning and goes back to sleep. Feels tired sometimes.   CPAP download shows excellent compliance with daily average usage at 6 hours.  Patient is on auto CPAP 5 to 15 cm H2O.  Daily average pressure at 10 cmH2O.  AHI 0.2.  Minimal leaks. Remaining active , walking each day. Working on weight loss. Wants to get off CPAP . Down 11lbs since last office vsit .   Discussed the dreamwear nasal mask , feels this may work better for her. Discussed wearing mask all night and avoiding early morning removal.  Allergies  Allergen Reactions  . Ace Inhibitors Swelling  . Lisinopril Swelling  . Amoxicillin Hives and Swelling  . Benadryl [Diphenhydramine Hcl (Sleep)] Hives and Swelling    Immunization History  Administered Date(s) Administered  . Influenza,inj,Quad PF,6+ Mos 08/30/2019  . Influenza-Unspecified 10/12/2017  . PFIZER SARS-COV-2 Vaccination 02/22/2020, 04/06/2020  . Tdap 09/09/2016    Past Medical History:  Diagnosis Date  . Allergy    seasonal  . Anxiety   . Depression   . Menopausal symptoms   . Metrorrhagia   . Obesity, Class III, BMI 40-49.9 (morbid obesity) ([place]) 07/16/2015  .  Vitamin D deficiency     Tobacco History: Social History   Tobacco Use  Smoking Status Never Smoker  Smokeless Tobacco Never Used   Counseling given: Not Answered   Outpatient Medications Prior to Visit  Medication Sig Dispense Refill  . amLODipine (NORVASC) 5 MG tablet Take 1 tablet (5 mg total) by mouth daily. 90 tablet 3  . buPROPion (WELLBUTRIN XL) 300 MG 24 hr tablet Take 1 tablet (300 mg total) by mouth daily. 90 tablet 3  . carvedilol (COREG) 25 MG tablet TAKE 1 TABLET BY MOUTH  TWICE DAILY WITH A MEAL 180 tablet 3  . cholecalciferol (VITAMIN D3) 25 MCG (1000 UNIT) tablet Take 1,000 Units by mouth daily.    Marland Kitchen EPINEPHrine 0.3 mg/0.3 mL IJ SOAJ injection     . hydrochlorothiazide (HYDRODIURIL) 25 MG tablet Take 1 tablet (25 mg total) by mouth daily. 90 tablet 3  . [START ON 08/30/2020] levothyroxine (SYNTHROID) 100 MCG tablet Take 1 tablet (100 mcg total) by mouth daily. 90 tablet 3  . vitamin B-12 (CYANOCOBALAMIN) 1000 MCG tablet Take 1,000 mcg by mouth daily.    Marland Kitchen levothyroxine (SYNTHROID) 25 MCG tablet Take 25 mcg po qam before breakfast x 7d, then take 50 mcg po qam x7d, then 75 mcg poqam x 7d, then 100 mcg poqam x 7d 70 tablet 0   No facility-administered medications prior to visit.  Review of Systems:   Constitutional:   No  weight loss, night sweats,  Fevers, chills, fatigue, or  lassitude.  HEENT:   No headaches,  Difficulty swallowing,  Tooth/dental problems, or  Sore throat,                No sneezing, itching, ear ache, nasal congestion, post nasal drip,   CV:  No chest pain,  Orthopnea, PND, swelling in lower extremities, anasarca, dizziness, palpitations, syncope.   GI  No heartburn, indigestion, abdominal pain, nausea, vomiting, diarrhea, change in bowel habits, loss of appetite, bloody stools.   Resp: No shortness of breath with exertion or at rest.  No excess mucus, no productive cough,  No non-productive cough,  No coughing up of blood.  No change in  color of mucus.  No wheezing.  No chest wall deformity  Skin: no rash or lesions.  GU: no dysuria, change in color of urine, no urgency or frequency.  No flank pain, no hematuria   MS:  No joint pain or swelling.  No decreased range of motion.  No back pain.    Physical Exam  BP 116/80 (BP Location: Right Arm, Cuff Size: Normal)   Pulse 78   Temp (!) 97.1 F (36.2 C) (Temporal)   Ht 5' 9.5" (1.765 m)   Wt 289 lb 9.6 oz (131.4 kg)   SpO2 95%   BMI 42.15 kg/m   GEN: A/Ox3; pleasant , NAD, well nourished    HEENT:  [place]/AT,   NOSE-clear, THROAT-clear, no lesions, no postnasal drip or exudate noted. Class 2-3 MP airway .   NECK:  Supple w/ fair ROM; no JVD; normal carotid impulses w/o bruits; no thyromegaly or nodules palpated; no lymphadenopathy.    RESP  Clear  P & A; w/o, wheezes/ rales/ or rhonchi. no accessory muscle use, no dullness to percussion  CARD:  RRR, no m/r/g, no peripheral edema, pulses intact, no cyanosis or clubbing.  GI:   Soft & nt; nml bowel sounds; no organomegaly or masses detected.   Musco: Warm bil, no deformities or joint swelling noted.   Neuro: alert, no focal deficits noted.    Skin: Warm, no lesions or rashes. No skin breakdown or pressure areas noted.     Lab Results:  BMET  BNP No results found for: BNP  ProBNP No results found for: PROBNP  Imaging: No results found.    No flowsheet data found.  No results found for: NITRICOXIDE      Assessment & Plan:   OSA (obstructive sleep apnea) Excellent control compliance on nocturnal CPAP  Plan  Patient Instructions  Keep up the good work Continue on CPAP at bedtime Continue to be active and work on healthy weight loss Do not drive if sleepy Follow-up in 1 year with Dr. Mortimer Fries and As needed       Obesity, Class III, BMI 40-49.9 (morbid obesity) ([place]) Encouraged on healthy weight loss  Plan  Patient Instructions  Keep up the good work Continue on CPAP at  bedtime Continue to be active and work on healthy weight loss Do not drive if sleepy Follow-up in 1 year with Dr. Mortimer Fries and As needed          Rexene Edison, NP 08/29/2020

## 2020-08-29 NOTE — Assessment & Plan Note (Signed)
Excellent control compliance on nocturnal CPAP  Plan  Patient Instructions  Keep up the good work Continue on CPAP at bedtime Continue to be active and work on healthy weight loss Do not drive if sleepy Follow-up in 1 year with Dr. Mortimer Fries and As needed

## 2020-08-29 NOTE — Addendum Note (Signed)
Addended by: Vanessa Barbara on: 08/29/2020 10:16 AM   Modules accepted: Orders

## 2020-08-29 NOTE — Patient Instructions (Addendum)
Keep up the good work Change to dreamwear nasal mask.  Continue on CPAP at bedtime Continue to be active and work on healthy weight loss Do not drive if sleepy Follow-up in 1 year with Dr. Mortimer Fries and As needed

## 2020-08-31 ENCOUNTER — Other Ambulatory Visit: Payer: Self-pay

## 2020-08-31 ENCOUNTER — Ambulatory Visit (INDEPENDENT_AMBULATORY_CARE_PROVIDER_SITE_OTHER): Payer: 59 | Admitting: Family Medicine

## 2020-08-31 ENCOUNTER — Encounter: Payer: Self-pay | Admitting: Family Medicine

## 2020-08-31 VITALS — BP 122/76 | HR 69 | Temp 98.0°F | Resp 16 | Ht 70.0 in | Wt 288.6 lb

## 2020-08-31 DIAGNOSIS — E8881 Metabolic syndrome: Secondary | ICD-10-CM | POA: Diagnosis not present

## 2020-08-31 DIAGNOSIS — E66813 Obesity, class 3: Secondary | ICD-10-CM

## 2020-08-31 DIAGNOSIS — E039 Hypothyroidism, unspecified: Secondary | ICD-10-CM | POA: Diagnosis not present

## 2020-08-31 DIAGNOSIS — D242 Benign neoplasm of left breast: Secondary | ICD-10-CM | POA: Insufficient documentation

## 2020-08-31 DIAGNOSIS — T782XXD Anaphylactic shock, unspecified, subsequent encounter: Secondary | ICD-10-CM

## 2020-08-31 DIAGNOSIS — G4733 Obstructive sleep apnea (adult) (pediatric): Secondary | ICD-10-CM

## 2020-08-31 DIAGNOSIS — R001 Bradycardia, unspecified: Secondary | ICD-10-CM

## 2020-08-31 DIAGNOSIS — I10 Essential (primary) hypertension: Secondary | ICD-10-CM

## 2020-08-31 MED ORDER — SAXENDA 18 MG/3ML ~~LOC~~ SOPN: PEN_INJECTOR | SUBCUTANEOUS | 3 refills | Status: DC

## 2020-08-31 MED ORDER — INSULIN PEN NEEDLE 32G X 4 MM MISC: 1 refills | Status: DC

## 2020-08-31 MED ORDER — EPINEPHRINE 0.3 MG/0.3ML IJ SOAJ: 0.3000 mg | INTRAMUSCULAR | 1 refills | Status: DC | PRN

## 2020-08-31 MED ORDER — CARVEDILOL 12.5 MG PO TABS: 12.5000 mg | ORAL_TABLET | Freq: Two times a day (BID) | ORAL | 3 refills | Status: DC

## 2020-08-31 NOTE — Progress Notes (Signed)
Name: Natalie Petersen   MRN: [id number]    DOB: 1976-11-14   Date:08/31/2020       Progress Note  Chief Complaint  Patient presents with  . Hypothyroidism    1 month recheck     Subjective:   Natalie Petersen is a 44 y.o. female, presents to clinic for f/up on thyroid, obesity/metabolic syndrome and few other requests  Follow up hypothyroid, started meds just over a month ago. Lab Results  Component Value Date   TSH 4.69 (H) 07/24/2020  she started levothyroxine in the morning on empty stomach did 25 mcg initially with dose increased by 25 mcg, she is currently taking 100 mcg daily for the past 2 weeks or so, she has not noticed any change to her sx.  Still stalled with weight despite continued efforts.    Of note she did f/up with CPAP/pulmonology - theya dvised pt to keep CPAP on longer for morning hours of sleep - she was still having events during this time and "undoing all the progress" of her other hours of CPAP compliance - they are adjusting supplies/face mask etc   Obesity: Weight stable - no further weight loss She checked with insurance and it said it would cover saxenda Wt Readings from Last 5 Encounters:  08/31/20 288 lb 9.6 oz (130.9 kg)  08/29/20 289 lb 9.6 oz (131.4 kg)  07/24/20 286 lb 1.6 oz (129.8 kg)  06/15/20 289 lb 6.4 oz (131.3 kg)  03/26/20 293 lb 9.6 oz (133.2 kg)   BMI Readings from Last 5 Encounters:  08/31/20 41.41 kg/m  08/29/20 42.15 kg/m  07/24/20 41.05 kg/m  06/15/20 42.12 kg/m  03/26/20 42.74 kg/m  Walking 5-7x a week, started to add in elliptical exercise, still doing noom diet program  Needs refill on epi pen    Current Outpatient Medications:  .  amLODipine (NORVASC) 5 MG tablet, Take 1 tablet (5 mg total) by mouth daily., Disp: 90 tablet, Rfl: 3 .  buPROPion (WELLBUTRIN XL) 300 MG 24 hr tablet, Take 1 tablet (300 mg total) by mouth daily., Disp: 90 tablet, Rfl: 3 .  carvedilol (COREG) 25 MG tablet, TAKE 1 TABLET BY MOUTH  TWICE  DAILY WITH A MEAL, Disp: 180 tablet, Rfl: 3 .  cholecalciferol (VITAMIN D3) 25 MCG (1000 UNIT) tablet, Take 1,000 Units by mouth daily., Disp: , Rfl:  .  hydrochlorothiazide (HYDRODIURIL) 25 MG tablet, Take 1 tablet (25 mg total) by mouth daily., Disp: 90 tablet, Rfl: 3 .  levothyroxine (SYNTHROID) 100 MCG tablet, Take 1 tablet (100 mcg total) by mouth daily., Disp: 90 tablet, Rfl: 3 .  vitamin B-12 (CYANOCOBALAMIN) 1000 MCG tablet, Take 1,000 mcg by mouth daily., Disp: , Rfl:  .  EPINEPHrine 0.3 mg/0.3 mL IJ SOAJ injection, Inject 0.3 mg into the muscle as needed for anaphylaxis., Disp: 2 each, Rfl: 1 .  levothyroxine (SYNTHROID) 25 MCG tablet, Take 25 mcg po qam before breakfast x 7d, then take 50 mcg po qam x7d, then 75 mcg poqam x 7d, then 100 mcg poqam x 7d, Disp: 70 tablet, Rfl: 0 .  Liraglutide -Weight Management (SAXENDA) 18 MG/3ML SOPN, Inject 0.6 mg into the skin daily. Increase by 0.41m q week until max dose of 334mis reached., Disp: 9 mL, Rfl: 3  Patient Active Problem List   Diagnosis Date Noted  . OSA (obstructive sleep apnea) 06/18/2020  . GAD (generalized anxiety disorder) 06/28/2019  . Elevated hematocrit 10/28/2018  . LVH (left ventricular hypertrophy)  04/08/2018  . Abnormal EKG 04/08/2018  . Essential hypertension, benign 03/30/2018  . Family history of ovarian cancer 10/26/2017  . Abnormal mammogram of left breast 09/04/2015  . Obesity, Class III, BMI 40-49.9 (morbid obesity) ([place]) 07/16/2015  . Depression, major, recurrent, in remission ([place]) 07/16/2015  . Vitamin D deficiency 07/16/2015  . Oral contraceptive pill surveillance 07/16/2015  . Breast cancer screening 07/16/2015    Past Surgical History:  Procedure Laterality Date  . BRAIN SURGERY      Family History  Problem Relation Age of Onset  . Heart murmur Mother   . Atrial fibrillation Mother   . Arthritis Father        RA  . Hyperlipidemia Father   . Cancer Maternal Aunt        ovarian and uterine   .  Arthritis Maternal Grandmother   . Heart attack Maternal Grandfather   . Stroke Paternal Grandmother   . Arthritis Paternal Grandmother   . Breast cancer Paternal Grandmother   . Stroke Paternal Uncle     Social History   Tobacco Use  . Smoking status: Never Smoker  . Smokeless tobacco: Never Used  Vaping Use  . Vaping Use: Never used  Substance Use Topics  . Alcohol use: Yes    Alcohol/week: 2.0 standard drinks    Types: 2 Glasses of wine per week  . Drug use: No     Allergies  Allergen Reactions  . Ace Inhibitors Swelling  . Lisinopril Swelling  . Amoxicillin Hives and Swelling  . Benadryl [Diphenhydramine Hcl (Sleep)] Hives and Swelling    Health Maintenance  Topic Date Due  . INFLUENZA VACCINE  07/22/2020  . MAMMOGRAM  02/16/2021  . PAP SMEAR-Modifier  07/25/2023  . TETANUS/TDAP  09/09/2026  . COVID-19 Vaccine  Completed  . Hepatitis C Screening  Completed  . HIV Screening  Completed    Chart Review Today: I personally reviewed active problem list, medication list, allergies, family history, social history, health maintenance, notes from last encounter, lab results, imaging with the patient/caregiver today.   Review of Systems  10 Systems reviewed and are negative for acute change except as noted in the HPI.  Objective:   Vitals:   08/31/20 1326  BP: 122/76  Pulse: 69  Resp: 16  Temp: 98 F (36.7 C)  TempSrc: Oral  SpO2: 99%  Weight: 288 lb 9.6 oz (130.9 kg)  Height: 5' 10"  (1.778 m)    Body mass index is 41.41 kg/m.  Physical Exam Vitals and nursing note reviewed.  Constitutional:      General: She is not in acute distress.    Appearance: Normal appearance. She is well-developed. She is obese. She is not ill-appearing, toxic-appearing or diaphoretic.  HENT:     Head: Normocephalic and atraumatic.  Eyes:     General:        Right eye: No discharge.        Left eye: No discharge.     Conjunctiva/sclera: Conjunctivae normal.  Neck:      Trachea: No tracheal deviation.  Cardiovascular:     Rate and Rhythm: Normal rate and regular rhythm.  Pulmonary:     Effort: Pulmonary effort is normal. No respiratory distress.     Breath sounds: No stridor.  Musculoskeletal:        General: Normal range of motion.  Skin:    General: Skin is warm and dry.     Findings: No rash.  Neurological:     Mental  Status: She is alert.     Motor: No abnormal muscle tone.     Coordination: Coordination normal.  Psychiatric:        Behavior: Behavior normal.         Assessment & Plan:   1. Hypothyroidism, unspecified type She has started and titrated up dose of levothyroxine w/o any SE or concerning sx, she dose not feel any different, but weight has been stable, no increase.  Repeat labs today and continue to adjust med dose per labs and sx.  Encouraged her to be patient for the next 1-2 months as body adjusts to hormones/meds - TSH  2. Obesity, Class III, BMI 40-49.9 (morbid obesity) ([place]) - with multiple comorbidities (see below) Discussed saxenda medication with pt for weight loss She has worked for more than 6 months of noom, exercise etc, had successful weight loss up until a few months ago when weight/BMI stalled: Pt has not hx of MTC or pancreatitis - no contraindications to meds Reviewed dosing and use with pt today Sample pen was demonstrated for pt/education done today all questions asked and answered.  I explained to pt we may need to do a PA for med  - Liraglutide -Weight Management (SAXENDA) 18 MG/3ML SOPN; Inject 0.6 mg into the skin daily. Increase by 0.53m q week until max dose of 327mis reached.  Dispense: 9 mL; Refill: 3 The requested drug is being used as an adjunct to lifestyle modification (e.g., dietary or caloric restriction, exercise, behavioral support, community based program). The patient has engaged in > 72m372monthf lifestyle modification and failed to achieve desired weight loss. The patient has BMI greater than  or equal to 30 kilograms / square meter with additional disorder related to weight (high cholesterol, hypertension, diabetes, or sleep apnea).  - Insulin Pen Needle 32G X 4 MM MISC; Use as directed with saxenda daily  Dispense: 100 each; Refill: 1  3. Metabolic syndrome BMI 41, increased waist diameter, OSA, HTN - pt meets criteria for metabolic syndrome - Liraglutide -Weight Management (SAXENDA) 18 MG/3ML SOPN; Inject 0.6 mg into the skin daily. Increase by 0.72mg63mweek until max dose of 3mg 109mreached.  Dispense: 9 mL; Refill: 3 - Insulin Pen Needle 32G X 4 MM MISC; Use as directed with saxenda daily  Dispense: 100 each; Refill: 1  4. Anaphylaxis, subsequent encounter Refills ordered - EPINEPHrine 0.3 mg/0.3 mL IJ SOAJ injection; Inject 0.3 mg into the muscle as needed for anaphylaxis.  Dispense: 2 each; Refill: 1  5. OSA (obstructive sleep apnea) Per sleep specialists - working on adjusting mask for comfort to help improve compliance  6. Essential hypertension, benign Stable, well controlled with norvasc and HCTZ - for low episodes and with improving health/diet and lifestyle changes + exercise - may be able to decrease med doses - pt will monitor at home with BP cuff, she declined EKG here today and VSS HR normal today  7.  Bradycardia She noted today episodes of HR to 50's and sometimes lower BP ~110/60-70 and not feeling well with these brief episodes  Reviewed chart further after hours today - reviewed past cardiac testing, Dx of HTN was sudden a few years ago, and initially coreg was started at 6.25 mg BID dose - currently on 25 mg BID - sent pt message suggesting we decrease to 12.5 mg BID and see if this avoids her low HR episodes and improved SE/sx With so much weight loss likely does not need this high  of dose, and she has not mentioned bothersome palpitations, plus past cardiac testing EKG and ECHO normal, and cardiology was not concerned and noted no need for any further cardiac  work up.   - carvedilol (COREG) 12.5 MG tablet; Take 1 tablet (12.5 mg total) by mouth 2 (two) times daily with a meal.  Dispense: 180 tablet; Refill: 3    Return in about 1 month (around 09/30/2020) for f/up on meds changes SE recheck.   Virtual encounter ok  Delsa Grana, PA-C 08/31/20 1:50 PM

## 2020-09-01 ENCOUNTER — Encounter: Payer: Self-pay | Admitting: Family Medicine

## 2020-09-01 LAB — TSH: TSH: 1.21 mIU/L

## 2020-09-03 ENCOUNTER — Ambulatory Visit: Payer: 59 | Admitting: Family Medicine

## 2020-09-06 ENCOUNTER — Telehealth: Payer: Self-pay

## 2020-09-06 NOTE — Telephone Encounter (Signed)
I spoke with patient regarding a letter she had written about previous visit with office.

## 2020-09-11 ENCOUNTER — Encounter: Payer: Self-pay | Admitting: Family Medicine

## 2020-09-27 ENCOUNTER — Other Ambulatory Visit: Payer: Self-pay | Admitting: Family Medicine

## 2020-09-27 DIAGNOSIS — E039 Hypothyroidism, unspecified: Secondary | ICD-10-CM

## 2020-10-02 NOTE — Progress Notes (Signed)
Name: Natalie Petersen   MRN: [id number]    DOB: 10/03/1976   Date:10/03/2020       Progress Note  Subjective:    I connected with  Tawni Pummel Swinger  on 10/03/20 at  9:00 AM EDT by a telephone and verified that I am speaking with the correct person using two identifiers.  I discussed the limitations of evaluation and management by telemedicine and the availability of in person appointments. The patient expressed understanding and agreed to proceed. Staff also discussed with the patient that there may be a patient responsible charge related to this service. Patient Location:  work Engineer, structural: [facility] office Additional Individuals present: none  Pt and I were unable to connect via video through [place], I called her via telephone and was unable to reach her, then tried again and pt and I spoke only on telephone  Chief Complaint  Patient presents with  . Follow-up  . Obesity    Natalie Petersen is a 44 y.o. female, presents for virtual visit for routine follow up on the conditions listed above.  F/up on Saxenda for medical weight management Up to 1.2 mg dose, she only has SE of dry mouth and HA - slight, lasts for 1-2 d when she changes her dose  Didn't get to start saxenda until last Monday when she got from the pharmacy - started 0.6 mg daily [place] dose, then this week increased to 1.2 mg.   Weight started at 285.5 lbs and down to 279.5 lbs She is still working on healthy diet - using noom which helps with calories/portions/macronutrients Working on exercising      Patient Active Problem List   Diagnosis Date Noted  . Fibroadenoma of left breast 08/31/2020  . OSA (obstructive sleep apnea) 06/18/2020  . GAD (generalized anxiety disorder) 06/28/2019  . LVH (left ventricular hypertrophy) 04/08/2018  . Essential hypertension, benign 03/30/2018  . Family history of ovarian cancer 10/26/2017  . Abnormal mammogram of left breast 09/04/2015  . Obesity, Class III, BMI 40-49.9 (morbid obesity)  ([place]) 07/16/2015  . Depression, major, recurrent, in remission ([place]) 07/16/2015  . Vitamin D deficiency 07/16/2015    Current Outpatient Medications:  .  amLODipine (NORVASC) 5 MG tablet, Take 1 tablet (5 mg total) by mouth daily., Disp: 90 tablet, Rfl: 3 .  buPROPion (WELLBUTRIN XL) 300 MG 24 hr tablet, Take 1 tablet (300 mg total) by mouth daily., Disp: 90 tablet, Rfl: 3 .  carvedilol (COREG) 12.5 MG tablet, Take 1 tablet (12.5 mg total) by mouth 2 (two) times daily with a meal. (Patient taking differently: Take 25 mg by mouth 2 (two) times daily with a meal. ), Disp: 180 tablet, Rfl: 3 .  cholecalciferol (VITAMIN D3) 25 MCG (1000 UNIT) tablet, Take 1,000 Units by mouth daily., Disp: , Rfl:  .  EPINEPHrine 0.3 mg/0.3 mL IJ SOAJ injection, Inject 0.3 mg into the muscle as needed for anaphylaxis., Disp: 2 each, Rfl: 1 .  hydrochlorothiazide (HYDRODIURIL) 25 MG tablet, Take 1 tablet (25 mg total) by mouth daily., Disp: 90 tablet, Rfl: 3 .  levothyroxine (SYNTHROID) 100 MCG tablet, Take 1 tablet (100 mcg total) by mouth daily., Disp: 90 tablet, Rfl: 3 .  Liraglutide -Weight Management (SAXENDA) 18 MG/3ML SOPN, Inject 0.6 mg into the skin daily. Increase by 0.60m q week until max dose of 351mis reached. (Patient taking differently: 1.2 mg. Inject 0.6 mg into the skin daily. Increase by 0.38m8m week until max dose of  39m is reached.), Disp: 9 mL, Rfl: 3 .  vitamin B-12 (CYANOCOBALAMIN) 1000 MCG tablet, Take 1,000 mcg by mouth daily., Disp: , Rfl:  .  Insulin Pen Needle 32G X 4 MM MISC, Use as directed with saxenda daily, Disp: 100 each, Rfl: 1 Allergies  Allergen Reactions  . Ace Inhibitors Swelling  . Lisinopril Swelling  . Amoxicillin Hives and Swelling  . Benadryl [Diphenhydramine Hcl (Sleep)] Hives and Swelling    Past Surgical History:  Procedure Laterality Date  . BRAIN SURGERY     Family History  Problem Relation Age of Onset  . Heart murmur Mother   . Atrial fibrillation Mother    . Arthritis Father        RA  . Hyperlipidemia Father   . Cancer Maternal Aunt        ovarian and uterine   . Arthritis Maternal Grandmother   . Heart attack Maternal Grandfather   . Stroke Paternal Grandmother   . Arthritis Paternal Grandmother   . Breast cancer Paternal Grandmother   . Stroke Paternal Uncle    Social History   Socioeconomic History  . Marital status: Married    Spouse name: Maximo Donaire  . Number of children: 1  . Years of education: Not on file  . Highest education level: Master's degree (e.g., MA, MS, MEng, MEd, MSW, MBA)  Occupational History    Comment: full time  Tobacco Use  . Smoking status: Never Smoker  . Smokeless tobacco: Never Used  Vaping Use  . Vaping Use: Never used  Substance and Sexual Activity  . Alcohol use: Yes    Alcohol/week: 2.0 standard drinks    Types: 2 Glasses of wine per week  . Drug use: No  . Sexual activity: Yes    Partners: Male    Birth control/protection: Pill  Other Topics Concern  . Not on file  Social History Narrative  . Not on file   Social Determinants of Health   Financial Resource Strain:   . Difficulty of Paying Living Expenses: Not on file  Food Insecurity:   . Worried About RCharity fundraiserin the Last Year: Not on file  . Ran Out of Food in the Last Year: Not on file  Transportation Needs:   . Lack of Transportation (Medical): Not on file  . Lack of Transportation (Non-Medical): Not on file  Physical Activity:   . Days of Exercise per Week: Not on file  . Minutes of Exercise per Session: Not on file  Stress:   . Feeling of Stress : Not on file  Social Connections:   . Frequency of Communication with Friends and Family: Not on file  . Frequency of Social Gatherings with Friends and Family: Not on file  . Attends Religious Services: Not on file  . Active Member of Clubs or Organizations: Not on file  . Attends CArchivistMeetings: Not on file  . Marital Status: Not on file   Intimate Partner Violence:   . Fear of Current or Ex-Partner: Not on file  . Emotionally Abused: Not on file  . Physically Abused: Not on file  . Sexually Abused: Not on file    Chart Review Today: I personally reviewed active problem list, medication list, allergies, family history, social history, health maintenance, notes from last encounter, lab results, imaging with the patient/caregiver today.   Review of Systems  10 Systems reviewed and are negative for acute change except as noted in the HPI.  Objective:    Virtual encounter, vitals limited, only able to obtain the following Today's Vitals   10/03/20 0900  Weight: 279 lb 8 oz (126.8 kg)  Height: 5' 9.5" (1.765 m)   Body mass index is 40.68 kg/m. Nursing Note and Vital Signs reviewed.  Physical Exam Alert, phonation clear PE limited by telephone encounter  No results found for this or any previous visit (from the past 72 hour(s)).    Assessment and Plan:   Pt presents for weight check: She is still working on diet/ exercise and started saxenda after approval went through So far tolerating and it has helped her loose some more weight, recent weight max of 300 lbs 01/2019. Down 9 lbs thus far Discussed dose titration and 4% weight loss to continue med  Wt Readings from Last 5 Encounters:  10/03/20 279 lb 8 oz (126.8 kg)  08/31/20 288 lb 9.6 oz (130.9 kg)  08/29/20 289 lb 9.6 oz (131.4 kg)  07/24/20 286 lb 1.6 oz (129.8 kg)  06/15/20 289 lb 6.4 oz (131.3 kg)   BMI Readings from Last 5 Encounters:  10/03/20 40.68 kg/m  08/31/20 41.41 kg/m  08/29/20 42.15 kg/m  07/24/20 41.05 kg/m  06/15/20 42.12 kg/m      ICD-10-CM   1. Encounter for weight management  Z76.89    started saxenda, tolerating so far, at 1.2 mg dose, discussed SE and titration, will f/up in 3-4 months for recheck - sooner if needed   2. Obesity, Class III, BMI 40-49.9 (morbid obesity) ([facility])  E66.01    some weight loss with starting  saxenda  3. Metabolic syndrome  G95.62   4. Medication monitoring encounter  Z51.81    tolerating new med, mild brief se so far, reviewed titration and SE - encouraged slow weekly increase, can go down to last tolerated dose if SE bothersome   Return in about 3 months (around 01/15/2021) for f/up on saxenda med - in office to document weight loss/med check and get PA.    I discussed the assessment and treatment plan with the patient. The patient was provided an opportunity to ask questions and all were answered. The patient agreed with the plan and demonstrated an understanding of the instructions.  The patient was advised to call back or seek an in-person evaluation if the symptoms worsen or if the condition fails to improve as anticipated.  I provided ~20 minutes of non-face-to-face time during this encounter.  Delsa Grana, PA-C 10/03/20 9:35 AM

## 2020-10-03 ENCOUNTER — Ambulatory Visit (INDEPENDENT_AMBULATORY_CARE_PROVIDER_SITE_OTHER): Payer: 59 | Admitting: Family Medicine

## 2020-10-03 ENCOUNTER — Encounter: Payer: Self-pay | Admitting: Family Medicine

## 2020-10-03 VITALS — Ht 69.5 in | Wt 279.5 lb

## 2020-10-03 DIAGNOSIS — E8881 Metabolic syndrome: Secondary | ICD-10-CM | POA: Diagnosis not present

## 2020-10-03 DIAGNOSIS — Z5181 Encounter for therapeutic drug level monitoring: Secondary | ICD-10-CM | POA: Diagnosis not present

## 2020-10-03 DIAGNOSIS — Z7689 Persons encountering health services in other specified circumstances: Secondary | ICD-10-CM | POA: Diagnosis not present

## 2020-10-03 DIAGNOSIS — E039 Hypothyroidism, unspecified: Secondary | ICD-10-CM

## 2021-01-14 ENCOUNTER — Other Ambulatory Visit: Payer: Self-pay

## 2021-01-14 DIAGNOSIS — E8881 Metabolic syndrome: Secondary | ICD-10-CM

## 2021-01-15 ENCOUNTER — Encounter: Payer: Self-pay | Admitting: Family Medicine

## 2021-01-15 MED ORDER — SAXENDA 18 MG/3ML ~~LOC~~ SOPN: PEN_INJECTOR | SUBCUTANEOUS | 3 refills | Status: DC

## 2021-03-22 ENCOUNTER — Other Ambulatory Visit: Payer: Self-pay | Admitting: Family Medicine

## 2021-03-22 DIAGNOSIS — E8881 Metabolic syndrome: Secondary | ICD-10-CM

## 2021-04-25 LAB — HM MAMMOGRAPHY

## 2021-05-04 ENCOUNTER — Other Ambulatory Visit: Payer: Self-pay | Admitting: Family Medicine

## 2021-05-04 DIAGNOSIS — F3341 Major depressive disorder, recurrent, in partial remission: Secondary | ICD-10-CM

## 2021-05-04 DIAGNOSIS — I10 Essential (primary) hypertension: Secondary | ICD-10-CM

## 2021-05-22 ENCOUNTER — Other Ambulatory Visit: Payer: Self-pay | Admitting: Family Medicine

## 2021-05-22 DIAGNOSIS — I10 Essential (primary) hypertension: Secondary | ICD-10-CM

## 2021-05-23 NOTE — Telephone Encounter (Signed)
Needs f/u appt

## 2021-06-22 ENCOUNTER — Other Ambulatory Visit: Payer: Self-pay | Admitting: Family Medicine

## 2021-06-22 DIAGNOSIS — R001 Bradycardia, unspecified: Secondary | ICD-10-CM

## 2021-06-22 DIAGNOSIS — I10 Essential (primary) hypertension: Secondary | ICD-10-CM

## 2021-06-25 IMAGING — MG US BREAST BX W LOC DEV 1ST LESION IMG BX SPEC US GUIDE*L*
1 series · 8 of 8 positions shown · non-contrast
Comparison: Previous exam(s).
COMPARISON: Previous exam(s).

Addendum:
CLINICAL DATA: 3.5 cm mass in the 3 o'clock position of the left
breast at recent mammography and ultrasound.

EXAM:
ULTRASOUND GUIDED LEFT BREAST CORE NEEDLE BIOPSY

[Series 1: MG view · 0.06mm/px · 8 of 12 slices shown]
[im 1/12]
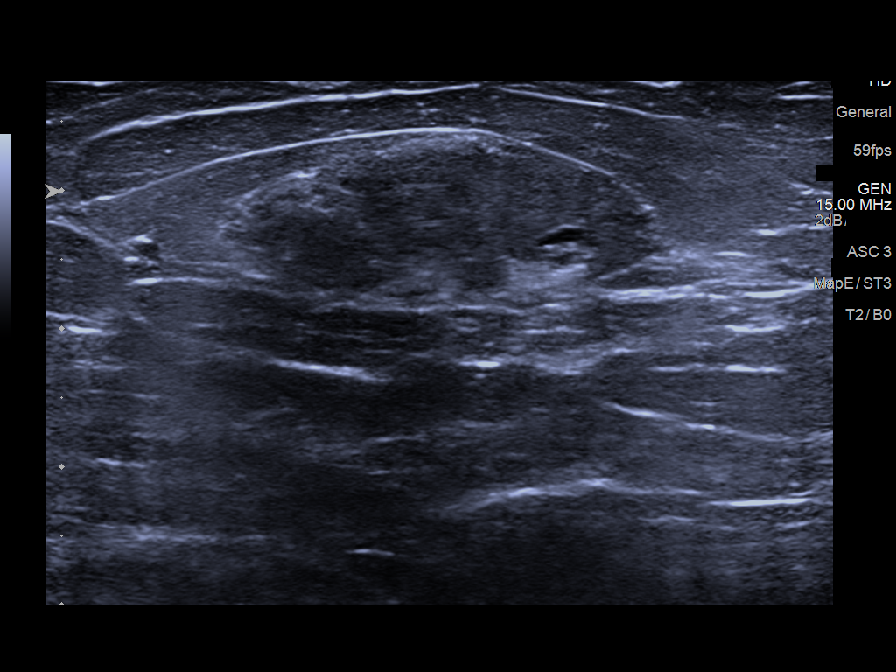
[im 2/12]
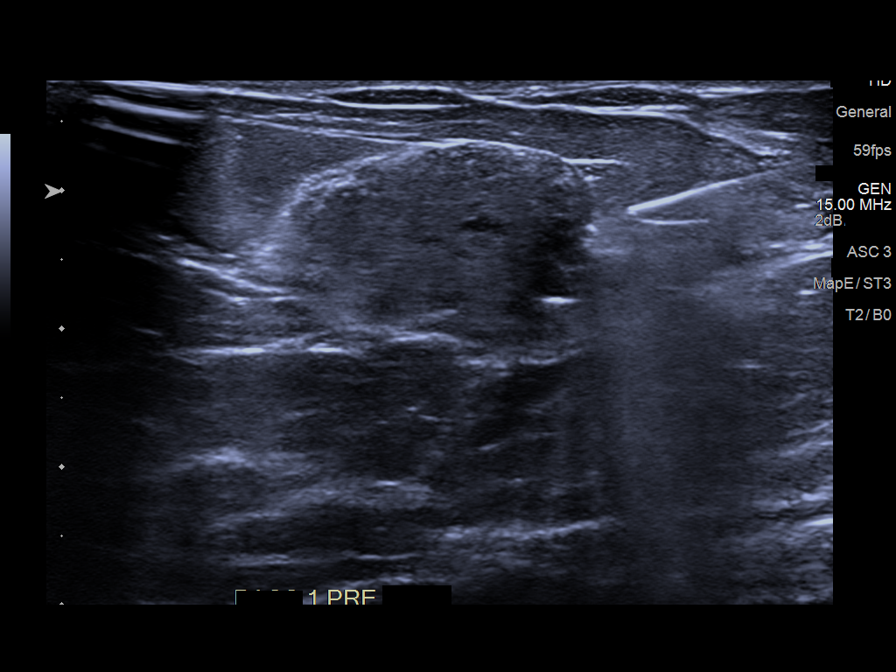
[im 4/12]
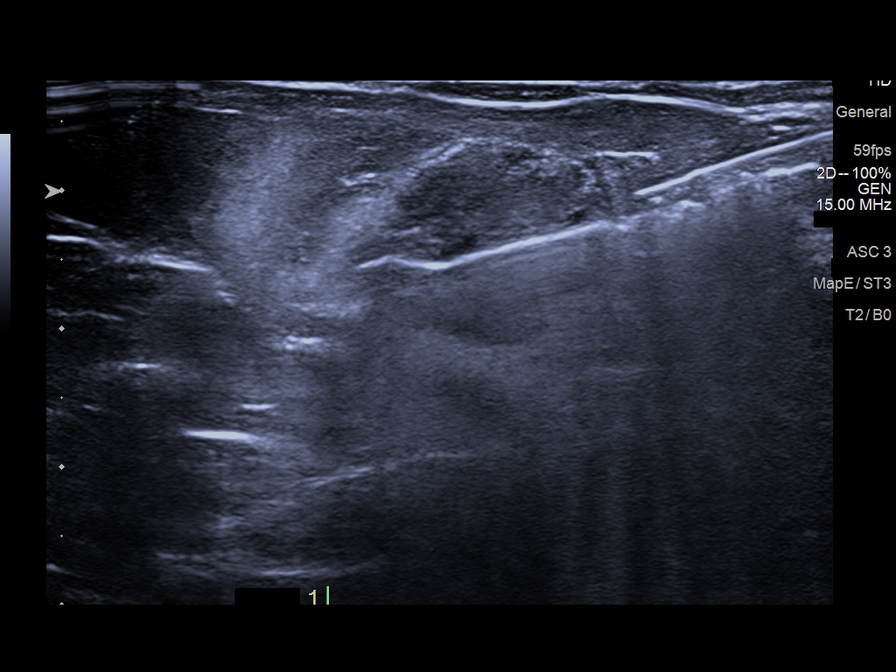
[im 5/12]
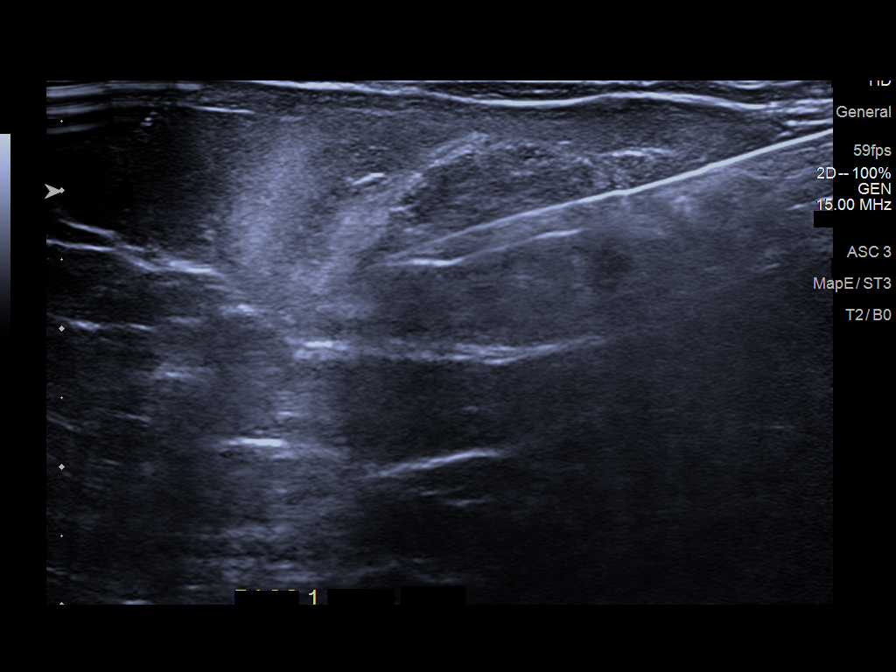
[im 7/12]
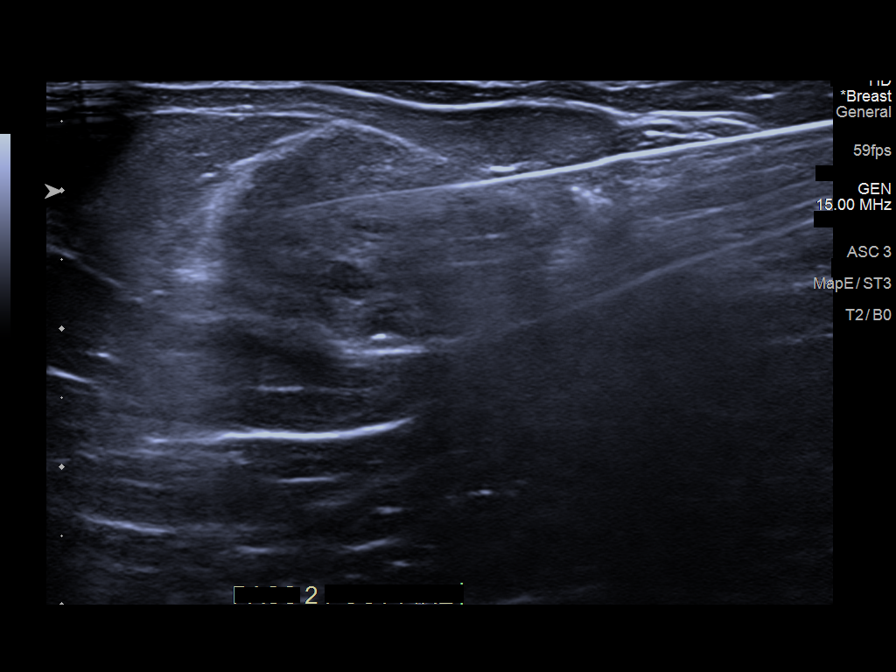
[im 8/12]
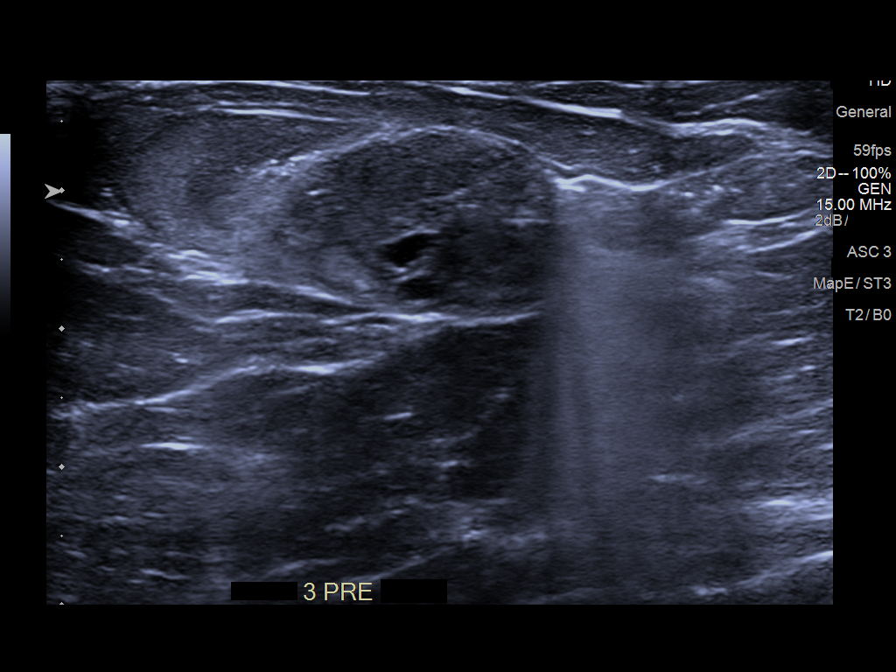
[im 10/12]
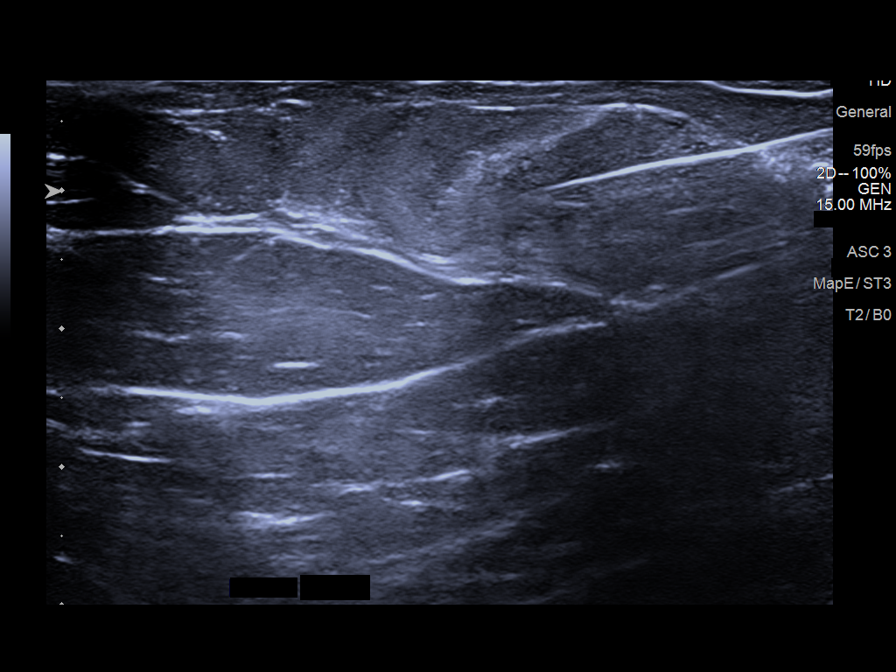
[im 12/12]
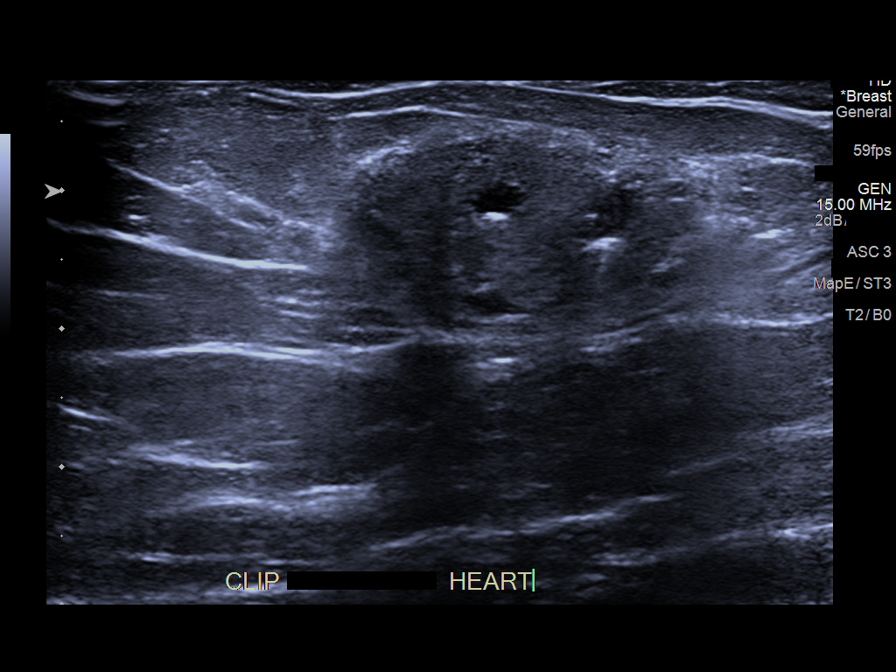

[8 of 8 positions shown; findings below may reference images not displayed]



Using sterile technique and 1% Lidocaine as local anesthetic, under
direct ultrasound visualization, a 12 gauge Glaud device was
used to perform biopsy of the recently demonstrated 3.5 cm mass in
the 3 o'clock position of the left breast, 5 cm from the nipple,
using a caudal approach. At the conclusion of the procedure a heart
shaped tissue marker clip was deployed into the biopsy cavity.
Follow up 2 view mammogram was performed and dictated separately.
IMPRESSION: Ultrasound guided biopsy of the recently demonstrated 3.5 cm mass in
the 3 o'clock position of the left breast. No apparent
complications.

ADDENDUM:
PATHOLOGY revealed: A. BREAST, LEFT, 3 O'CLOCK 5 CM FROM NIPPLE;
ULTRASOUND-GUIDED CORE BIOPSY: - FIBROEPITHELIAL LESION COMPATIBLE
WITH COMPLEX FIBROADENOMA.

Comment: The lesion appears to have a circumscribed border in this
sample. The epithelial component is negative for atypia and
malignancy. The stroma is hypocellular with areas of
pseudoangiomatous hyperplasia (PASH). Cysts and apocrine change are
present. Fibroepithelial lesions can be heterogeneous and core
samples may not be representative, especially when the lesion is
large, so correlation with history and imaging findings is
recommended.

Pathology results are CONCORDANT with imaging findings, per Dr.
Kiri Jim.

Pathology results and recommendations below were discussed with
patient by telephone on 03/13/2020. Patient reported biopsy site
doing well with slight tenderness at the site. Post biopsy care
instructions were reviewed and questions were answered. Patient was
instructed to call [HOSPITAL] if any concerns or
questions arise related to the biopsy.

Recommendation: Surgical referral for possible excision of LEFT
breast biopsied lesion. Request for surgical referral was relayed to
Kwan Yee Kam RT at [HOSPITAL] by Esmaili Genis RN on
03/13/2020.

Addendum by Esmaili Genis RN on 03/13/2020.



Using sterile technique and 1% Lidocaine as local anesthetic, under
direct ultrasound visualization, a 12 gauge Glaud device was
used to perform biopsy of the recently demonstrated 3.5 cm mass in
the 3 o'clock position of the left breast, 5 cm from the nipple,
using a caudal approach. At the conclusion of the procedure a heart
shaped tissue marker clip was deployed into the biopsy cavity.
Follow up 2 view mammogram was performed and dictated separately.
IMPRESSION: Ultrasound guided biopsy of the recently demonstrated 3.5 cm mass in
the 3 o'clock position of the left breast. No apparent
complications.

## 2021-07-23 ENCOUNTER — Ambulatory Visit (INDEPENDENT_AMBULATORY_CARE_PROVIDER_SITE_OTHER): Payer: 59 | Admitting: Unknown Physician Specialty

## 2021-07-23 ENCOUNTER — Encounter: Payer: Self-pay | Admitting: Family Medicine

## 2021-07-23 ENCOUNTER — Other Ambulatory Visit: Payer: Self-pay

## 2021-07-23 ENCOUNTER — Encounter: Payer: Self-pay | Admitting: Unknown Physician Specialty

## 2021-07-23 ENCOUNTER — Other Ambulatory Visit: Payer: Self-pay | Admitting: Family Medicine

## 2021-07-23 VITALS — BP 118/82 | HR 76 | Temp 98.3°F | Resp 16 | Ht 70.0 in | Wt 287.0 lb

## 2021-07-23 DIAGNOSIS — I10 Essential (primary) hypertension: Secondary | ICD-10-CM | POA: Diagnosis not present

## 2021-07-23 DIAGNOSIS — E66813 Obesity, class 3: Secondary | ICD-10-CM

## 2021-07-23 DIAGNOSIS — E039 Hypothyroidism, unspecified: Secondary | ICD-10-CM

## 2021-07-23 DIAGNOSIS — F3341 Major depressive disorder, recurrent, in partial remission: Secondary | ICD-10-CM

## 2021-07-23 DIAGNOSIS — R001 Bradycardia, unspecified: Secondary | ICD-10-CM | POA: Diagnosis not present

## 2021-07-23 DIAGNOSIS — E038 Other specified hypothyroidism: Secondary | ICD-10-CM | POA: Insufficient documentation

## 2021-07-23 DIAGNOSIS — Z1231 Encounter for screening mammogram for malignant neoplasm of breast: Secondary | ICD-10-CM

## 2021-07-23 DIAGNOSIS — Z1211 Encounter for screening for malignant neoplasm of colon: Secondary | ICD-10-CM

## 2021-07-23 DIAGNOSIS — F3340 Major depressive disorder, recurrent, in remission, unspecified: Secondary | ICD-10-CM

## 2021-07-23 DIAGNOSIS — G4733 Obstructive sleep apnea (adult) (pediatric): Secondary | ICD-10-CM

## 2021-07-23 DIAGNOSIS — Z5181 Encounter for therapeutic drug level monitoring: Secondary | ICD-10-CM

## 2021-07-23 MED ORDER — BUPROPION HCL ER (XL) 150 MG PO TB24: 150.0000 mg | ORAL_TABLET | Freq: Every day | ORAL | 1 refills | Status: DC

## 2021-07-23 MED ORDER — LEVOTHYROXINE SODIUM 100 MCG PO TABS: 100.0000 ug | ORAL_TABLET | Freq: Every day | ORAL | 3 refills | Status: DC

## 2021-07-23 MED ORDER — BUPROPION HCL ER (XL) 300 MG PO TB24: 300.0000 mg | ORAL_TABLET | Freq: Every day | ORAL | 3 refills | Status: DC

## 2021-07-23 MED ORDER — CARVEDILOL 12.5 MG PO TABS: 12.5000 mg | ORAL_TABLET | Freq: Two times a day (BID) | ORAL | 3 refills | Status: DC

## 2021-07-23 MED ORDER — HYDROCHLOROTHIAZIDE 25 MG PO TABS: 25.0000 mg | ORAL_TABLET | Freq: Every day | ORAL | 3 refills | Status: DC

## 2021-07-23 MED ORDER — AMLODIPINE BESYLATE 5 MG PO TABS: 5.0000 mg | ORAL_TABLET | Freq: Every day | ORAL | 3 refills | Status: DC

## 2021-07-23 NOTE — Assessment & Plan Note (Signed)
Restart Saxenda with taper up to 1.8 mg.  Tapering instructions reviewed with patient

## 2021-07-23 NOTE — Assessment & Plan Note (Signed)
Uses CPAP nightly 

## 2021-07-23 NOTE — Assessment & Plan Note (Addendum)
Stable, continue present medications. On Coreg 25 mg BID and may need adjustment due to lack of motivation

## 2021-07-23 NOTE — Assessment & Plan Note (Signed)
Increase Wellbutrin to 450 mg at previous tolerated dose.  Recheck in 4 weeks

## 2021-07-23 NOTE — Assessment & Plan Note (Signed)
On 100 mcgs.  Check thyroid panel today due to persistent symptoms

## 2021-07-23 NOTE — Progress Notes (Signed)
BP 118/82   Pulse 76   Temp 98.3 F (36.8 C)   Resp 16   Ht 5' 10"  (1.778 m)   Wt 287 lb (130.2 kg)   LMP 07/02/2021   SpO2 97%   BMI 41.18 kg/m    Subjective:    Patient ID: Natalie Petersen, female    DOB: 28-Feb-1976, 45 y.o.   MRN: [id number]  HPI: Natalie Petersen is a 45 y.o. female  Chief Complaint  Patient presents with   Medication Refill   Hypertension   Hypothyroidism   Depression   Hypertension Using medications without difficulty Average home BPs:  118-120/68-84   No problems or lightheadedness No chest pain with exertion or shortness of breath No Edema   The 10-year ASCVD risk score Mikey Bussing DC Jr., et al., 2013) is: 0.7%   Values used to calculate the score:     Age: 64 years     Sex: Female     Is Non-Hispanic African American: No     Diabetic: No     Tobacco smoker: No     Systolic Blood Pressure: 160 mmHg     Is BP treated: Yes     HDL Cholesterol: 67 mg/dL     Total Cholesterol: 200 mg/dL   Hypothyroid Weight is "up and down" Stopped the [place] as wanted to discuss Wellbutrin.  Feeling tired in general and not sure if it's related to Depression or thyroid  Depression In general, lack of motivation. Pushing self to walk.  Thinks she might need to go up on Wellbutrin and had previously been on 450 mg.  More irritated than in the past.  Takes Vitamin D and B12.  Using CPAP nightly Depression screen [facility] 2/9 07/23/2021 10/03/2020 08/31/2020 07/24/2020 06/15/2020  Decreased Interest 1 0 0 0 0  Down, Depressed, Hopeless 0 0 0 0 0  PHQ - 2 Score 1 0 0 0 0  Altered sleeping 1 0 - 0 0  Tired, decreased energy 1 0 - 0 0  Change in appetite 1 0 - 0 0  Feeling bad or failure about yourself  1 0 - 0 0  Trouble concentrating 0 0 - 0 0  Moving slowly or fidgety/restless 0 0 - 0 0  Suicidal thoughts 0 0 - 0 0  PHQ-9 Score 5 0 - 0 0  Difficult doing work/chores Somewhat difficult Not difficult at all - Not difficult at all Not difficult at all  Some recent data might  be hidden   Having regular monthly menstrual periods. No menopausal symptoms.    Relevant past medical, surgical, family and social history reviewed and updated as indicated. Interim medical history since our last visit reviewed. Allergies and medications reviewed and updated.  Review of Systems  Per HPI unless specifically indicated above     Objective:    BP 118/82   Pulse 76   Temp 98.3 F (36.8 C)   Resp 16   Ht 5' 10"  (1.778 m)   Wt 287 lb (130.2 kg)   LMP 07/02/2021   SpO2 97%   BMI 41.18 kg/m   Wt Readings from Last 3 Encounters:  07/23/21 287 lb (130.2 kg)  10/03/20 279 lb 8 oz (126.8 kg)  08/31/20 288 lb 9.6 oz (130.9 kg)    Physical Exam Constitutional:      General: She is not in acute distress.    Appearance: Normal appearance. She is well-developed.  HENT:     Head: Normocephalic and  atraumatic.  Eyes:     General: Lids are normal. No scleral icterus.       Right eye: No discharge.        Left eye: No discharge.     Conjunctiva/sclera: Conjunctivae normal.  Neck:     Vascular: No carotid bruit or JVD.  Cardiovascular:     Rate and Rhythm: Normal rate and regular rhythm.     Heart sounds: Normal heart sounds.  Pulmonary:     Effort: Pulmonary effort is normal. No respiratory distress.     Breath sounds: Normal breath sounds.  Abdominal:     Palpations: There is no hepatomegaly or splenomegaly.  Musculoskeletal:        General: Normal range of motion.     Cervical back: Normal range of motion and neck supple.  Skin:    General: Skin is warm and dry.     Coloration: Skin is not pale.     Findings: No rash.  Neurological:     Mental Status: She is alert and oriented to person, place, and time.  Psychiatric:        Behavior: Behavior normal.        Thought Content: Thought content normal.        Judgment: Judgment normal.    Results for orders placed or performed in visit on 08/31/20  TSH  Result Value Ref Range   TSH 1.21 mIU/L       Assessment & Plan:   Problem List Items Addressed This Visit       Unprioritized   Essential hypertension, benign (Chronic)    Stable, continue present medications. On Coreg 25 mg BID and may need adjustment due to lack of motivation        Relevant Medications   hydrochlorothiazide (HYDRODIURIL) 25 MG tablet   amLODipine (NORVASC) 5 MG tablet   carvedilol (COREG) 12.5 MG tablet   Other Relevant Orders   Lipid panel   COMPLETE METABOLIC PANEL WITH GFR   Obesity, Class III, BMI 40-49.9 (morbid obesity) ([place]) (Chronic)    Restart Saxenda with taper up to 1.8 mg.  Tapering instructions reviewed with patient       RESOLVED: Depression, major, recurrent, in remission (HCC)   Relevant Medications   buPROPion (WELLBUTRIN XL) 300 MG 24 hr tablet   buPROPion (WELLBUTRIN XL) 150 MG 24 hr tablet   Hypothyroid - Primary    On 100 mcgs.  Check thyroid panel today due to persistent symptoms       Relevant Medications   levothyroxine (SYNTHROID) 100 MCG tablet   carvedilol (COREG) 12.5 MG tablet   Other Relevant Orders   Thyroid Panel With TSH   MDD (major depressive disorder), recurrent, in partial remission (HCC)    Increase Wellbutrin to 450 mg at previous tolerated dose.  Recheck in 4 weeks       Relevant Medications   buPROPion (WELLBUTRIN XL) 300 MG 24 hr tablet   buPROPion (WELLBUTRIN XL) 150 MG 24 hr tablet   OSA (obstructive sleep apnea)    Uses CPAP nightly       Other Visit Diagnoses     Bradycardia       Relevant Medications   carvedilol (COREG) 12.5 MG tablet   Encounter for screening mammogram for malignant neoplasm of breast       Medication monitoring encounter       Screening for colon cancer       Relevant Orders   Cologuard  Follow up plan: Return in about 4 weeks (around 08/20/2021).

## 2021-07-23 NOTE — Patient Instructions (Addendum)
Think about taking K2 with Vitamin D

## 2021-07-24 LAB — COMPLETE METABOLIC PANEL WITH GFR
AG Ratio: 1.7 (calc) (ref 1.0–2.5)
ALT: 14 U/L (ref 6–29)
AST: 12 U/L (ref 10–35)
Albumin: 4.2 g/dL (ref 3.6–5.1)
Alkaline phosphatase (APISO): 65 U/L (ref 31–125)
BUN: 12 mg/dL (ref 7–25)
CO2: 29 mmol/L (ref 20–32)
Calcium: 9.5 mg/dL (ref 8.6–10.2)
Chloride: 100 mmol/L (ref 98–110)
Creat: 0.78 mg/dL (ref 0.50–0.99)
Globulin: 2.5 g/dL (calc) (ref 1.9–3.7)
Glucose, Bld: 106 mg/dL — ABNORMAL HIGH (ref 65–99)
Potassium: 4.4 mmol/L (ref 3.5–5.3)
Sodium: 137 mmol/L (ref 135–146)
Total Bilirubin: 0.7 mg/dL (ref 0.2–1.2)
Total Protein: 6.7 g/dL (ref 6.1–8.1)
eGFR: 95 mL/min/{1.73_m2} (ref >=60)

## 2021-07-24 LAB — LIPID PANEL
Cholesterol: 217 mg/dL — ABNORMAL HIGH (ref <200)
HDL: 69 mg/dL (ref >=50)
LDL Cholesterol (Calc): 118 mg/dL (calc) — ABNORMAL HIGH
Non-HDL Cholesterol (Calc): 148 mg/dL (calc) — ABNORMAL HIGH (ref <130)
Total CHOL/HDL Ratio: 3.1 (calc) (ref <5.0)
Triglycerides: 178 mg/dL — ABNORMAL HIGH (ref <150)

## 2021-07-24 LAB — THYROID PANEL WITH TSH
Free Thyroxine Index: 2.6 (ref 1.4–3.8)
T3 Uptake: 33 % (ref 22–35)
T4, Total: 7.8 ug/dL (ref 5.1–11.9)
TSH: 2 mIU/L

## 2021-07-25 MED ORDER — BUPROPION HCL ER (XL) 150 MG PO TB24: 150.0000 mg | ORAL_TABLET | Freq: Every day | ORAL | 0 refills | Status: DC

## 2021-07-31 LAB — COLOGUARD

## 2021-08-08 ENCOUNTER — Telehealth: Payer: Self-pay | Admitting: *Deleted

## 2021-08-08 NOTE — Telephone Encounter (Signed)
Patient returned call- she wanted to let office know-she got message- was sent another test and has already repeated and mailed it back on Tuesday.Patient has also scheduled her follow up appointment with Fortino Sic., PA.

## 2021-08-13 LAB — COLOGUARD: Cologuard: NEGATIVE

## 2021-08-20 ENCOUNTER — Ambulatory Visit: Payer: 59 | Admitting: Family Medicine

## 2021-08-22 ENCOUNTER — Encounter: Payer: Self-pay | Admitting: Unknown Physician Specialty

## 2021-08-22 ENCOUNTER — Ambulatory Visit (INDEPENDENT_AMBULATORY_CARE_PROVIDER_SITE_OTHER): Payer: 59 | Admitting: Unknown Physician Specialty

## 2021-08-22 ENCOUNTER — Other Ambulatory Visit: Payer: Self-pay

## 2021-08-22 DIAGNOSIS — I10 Essential (primary) hypertension: Secondary | ICD-10-CM | POA: Diagnosis not present

## 2021-08-22 DIAGNOSIS — F3341 Major depressive disorder, recurrent, in partial remission: Secondary | ICD-10-CM

## 2021-08-22 NOTE — Assessment & Plan Note (Signed)
Improved motivation with increase of Wellbutrin.  Will continue with present dose

## 2021-08-22 NOTE — Progress Notes (Signed)
BP 128/82   Pulse 80   Temp 97.8 F (36.6 C)   Ht 5' 10"  (1.778 m)   Wt 272 lb 4.8 oz (123.5 kg)   LMP 08/20/2021   SpO2 98%   BMI 39.07 kg/m    Subjective:    Patient ID: Natalie Petersen, female    DOB: 1976/11/08, 45 y.o.   MRN: [id number]  HPI: Natalie Petersen is a 45 y.o. female  Chief Complaint  Patient presents with   Hypertension   Hypothyroidism   Depression   Obesity   Pt is here to follow up on the above concerns.  She is not feeling as well lately due to light-headedness and noted BP is a little low with SBP from 86-116.  Pulse typically in the 60's.    Weight loss - lost 15 pounds in the last month.  Still forcing herself to do things.  Noticed energy increase with increasing Wellbutrin.  Taking Saxenda and doing well but will decrease the dose due to very rapid weight loss.  Doing a lot of walking from 3-6.5 miles/day.  Last thyroid levels were stable  Depression screen [facility] 2/9 08/22/2021 07/23/2021 10/03/2020 08/31/2020 07/24/2020  Decreased Interest 1 1 0 0 0  Down, Depressed, Hopeless 0 0 0 0 0  PHQ - 2 Score 1 1 0 0 0  Altered sleeping 0 1 0 - 0  Tired, decreased energy 1 1 0 - 0  Change in appetite 0 1 0 - 0  Feeling bad or failure about yourself  0 1 0 - 0  Trouble concentrating 0 0 0 - 0  Moving slowly or fidgety/restless 0 0 0 - 0  Suicidal thoughts 0 0 0 - 0  PHQ-9 Score 2 5 0 - 0  Difficult doing work/chores Not difficult at all Somewhat difficult Not difficult at all - Not difficult at all  Some recent data might be hidden     Relevant past medical, surgical, family and social history reviewed and updated as indicated. Interim medical history since our last visit reviewed. Allergies and medications reviewed and updated.  Review of Systems  Per HPI unless specifically indicated above     Objective:    BP 128/82   Pulse 80   Temp 97.8 F (36.6 C)   Ht 5' 10"  (1.778 m)   Wt 272 lb 4.8 oz (123.5 kg)   LMP 08/20/2021   SpO2 98%   BMI 39.07 kg/m    Wt Readings from Last 3 Encounters:  08/22/21 272 lb 4.8 oz (123.5 kg)  07/23/21 287 lb (130.2 kg)  10/03/20 279 lb 8 oz (126.8 kg)    Physical Exam Constitutional:      General: She is not in acute distress.    Appearance: Normal appearance. She is well-developed.  HENT:     Head: Normocephalic and atraumatic.  Eyes:     General: Lids are normal. No scleral icterus.       Right eye: No discharge.        Left eye: No discharge.     Conjunctiva/sclera: Conjunctivae normal.  Neck:     Vascular: No carotid bruit or JVD.  Cardiovascular:     Rate and Rhythm: Normal rate and regular rhythm.     Heart sounds: Normal heart sounds.  Pulmonary:     Effort: Pulmonary effort is normal. No respiratory distress.     Breath sounds: Normal breath sounds.  Abdominal:     Palpations: There is no  hepatomegaly or splenomegaly.  Musculoskeletal:        General: Normal range of motion.     Cervical back: Normal range of motion and neck supple.  Skin:    General: Skin is warm and dry.     Coloration: Skin is not pale.     Findings: No rash.  Neurological:     Mental Status: She is alert and oriented to person, place, and time.  Psychiatric:        Behavior: Behavior normal.        Thought Content: Thought content normal.        Judgment: Judgment normal.    Results for orders placed or performed in visit on 07/23/21  HM MAMMOGRAPHY  Result Value Ref Range   HM Mammogram 0-4 Bi-Rad 0-4 Bi-Rad, Self Reported Normal      Assessment & Plan:   Problem List Items Addressed This Visit       Unprioritized   Essential hypertension, benign (Chronic)    Hypotensive with current medications.  Will decrease the Coreg to 6.25 mg twice a day for 2 weeks and then stop.  Keep eye on BP.  She will let me know if it is still running low.  Recheck 1 month      Obesity, Class III, BMI 40-49.9 (morbid obesity) (HCC) (Chronic)    Lost 15 pounds in the last month.  Continue present but will  self-titrated Saxenda down due to rapid weight loss and the need for some calories with walking regimen.  She may titrate up again as needed      MDD (major depressive disorder), recurrent, in partial remission ([place])    Improved motivation with increase of Wellbutrin.  Will continue with present dose        Follow up plan: Return in about 4 weeks (around 09/19/2021).

## 2021-08-22 NOTE — Assessment & Plan Note (Signed)
Lost 15 pounds in the last month.  Continue present but will self-titrated Saxenda down due to rapid weight loss and the need for some calories with walking regimen.  She may titrate up again as needed

## 2021-08-22 NOTE — Assessment & Plan Note (Signed)
Hypotensive with current medications.  Will decrease the Coreg to 6.25 mg twice a day for 2 weeks and then stop.  Keep eye on BP.  She will let me know if it is still running low.  Recheck 1 month

## 2021-09-26 ENCOUNTER — Ambulatory Visit (INDEPENDENT_AMBULATORY_CARE_PROVIDER_SITE_OTHER): Payer: 59 | Admitting: Unknown Physician Specialty

## 2021-09-26 ENCOUNTER — Encounter: Payer: Self-pay | Admitting: Unknown Physician Specialty

## 2021-09-26 ENCOUNTER — Other Ambulatory Visit: Payer: Self-pay

## 2021-09-26 DIAGNOSIS — F3341 Major depressive disorder, recurrent, in partial remission: Secondary | ICD-10-CM

## 2021-09-26 DIAGNOSIS — I10 Essential (primary) hypertension: Secondary | ICD-10-CM | POA: Diagnosis not present

## 2021-09-26 NOTE — Progress Notes (Signed)
BP (!) 144/84   Pulse 97   Temp 97.9 F (36.6 C) (Oral)   Resp 18   Ht 5' 10"  (1.778 m)   Wt 263 lb 1.6 oz (119.3 kg)   LMP 09/20/2021 (Exact Date)   SpO2 98%   BMI 37.75 kg/m    Subjective:    Patient ID: Natalie Petersen, female    DOB: 26-Jan-1976, 45 y.o.   MRN: [id number]  HPI: Natalie Petersen is a 45 y.o. female  Chief Complaint  Patient presents with   Hypertension   Hypothyroidism   Depression   Pt is here for f/u of her BP after stopping Coreg.  She feels well with improved motivation and decreased lethargy.  She brought numbers from home which range from 96-126/71-90.  She is continuing to lose weight on Saxenda and is tolerating 2.4 most of the time but sometimes decreases to 1.8.    Depression screen [facility] 2/9 09/26/2021 08/22/2021 07/23/2021 10/03/2020 08/31/2020  Decreased Interest 0 1 1 0 0  Down, Depressed, Hopeless 0 0 0 0 0  PHQ - 2 Score 0 1 1 0 0  Altered sleeping 0 0 1 0 -  Tired, decreased energy 1 1 1  0 -  Change in appetite 0 0 1 0 -  Feeling bad or failure about yourself  0 0 1 0 -  Trouble concentrating 0 0 0 0 -  Moving slowly or fidgety/restless 0 0 0 0 -  Suicidal thoughts 0 0 0 0 -  PHQ-9 Score 1 2 5  0 -  Difficult doing work/chores Not difficult at all Not difficult at all Somewhat difficult Not difficult at all -  Some recent data might be hidden     Relevant past medical, surgical, family and social history reviewed and updated as indicated. Interim medical history since our last visit reviewed. Allergies and medications reviewed and updated.  Review of Systems  Per HPI unless specifically indicated above     Objective:    BP (!) 144/84   Pulse 97   Temp 97.9 F (36.6 C) (Oral)   Resp 18   Ht 5' 10"  (1.778 m)   Wt 263 lb 1.6 oz (119.3 kg)   LMP 09/20/2021 (Exact Date)   SpO2 98%   BMI 37.75 kg/m   Wt Readings from Last 3 Encounters:  09/26/21 263 lb 1.6 oz (119.3 kg)  08/22/21 272 lb 4.8 oz (123.5 kg)  07/23/21 287 lb (130.2 kg)     Physical Exam Constitutional:      General: She is not in acute distress.    Appearance: Normal appearance. She is well-developed.  HENT:     Head: Normocephalic and atraumatic.  Eyes:     General: Lids are normal. No scleral icterus.       Right eye: No discharge.        Left eye: No discharge.     Conjunctiva/sclera: Conjunctivae normal.  Neck:     Vascular: No carotid bruit or JVD.  Cardiovascular:     Rate and Rhythm: Normal rate and regular rhythm.     Heart sounds: Normal heart sounds.  Pulmonary:     Effort: Pulmonary effort is normal. No respiratory distress.     Breath sounds: Normal breath sounds.  Abdominal:     Palpations: There is no hepatomegaly or splenomegaly.  Musculoskeletal:        General: Normal range of motion.     Cervical back: Normal range of motion and  neck supple.  Skin:    General: Skin is warm and dry.     Coloration: Skin is not pale.     Findings: No rash.  Neurological:     Mental Status: She is alert and oriented to person, place, and time.  Psychiatric:        Behavior: Behavior normal.        Thought Content: Thought content normal.        Judgment: Judgment normal.    Results for orders placed or performed in visit on 07/23/21  HM MAMMOGRAPHY  Result Value Ref Range   HM Mammogram 0-4 Bi-Rad 0-4 Bi-Rad, Self Reported Normal      Assessment & Plan:   Problem List Items Addressed This Visit       Unprioritized   Essential hypertension, benign (Chronic)    BP is improving with rapid weight loss.  Will decrease HCTZ from 25 mg to 12.5 mg.  Continue Amlodipine      Obesity, Class III, BMI 40-49.9 (morbid obesity) (HCC) (Chronic)    Rapid weight loss with Saxenda and planning on participating in a 1/2 marathon      MDD (major depressive disorder), recurrent, in partial remission ([place])    Stable, continue present medications.          Follow up plan: Return in about 4 weeks (around 10/24/2021), or if symptoms worsen or  fail to improve.

## 2021-09-26 NOTE — Assessment & Plan Note (Signed)
BP is improving with rapid weight loss.  Will decrease HCTZ from 25 mg to 12.5 mg.  Continue Amlodipine

## 2021-09-26 NOTE — Assessment & Plan Note (Signed)
Rapid weight loss with Saxenda and planning on participating in a 1/2 marathon

## 2021-09-26 NOTE — Assessment & Plan Note (Signed)
Stable, continue present medications.   

## 2021-10-22 ENCOUNTER — Other Ambulatory Visit: Payer: Self-pay | Admitting: Unknown Physician Specialty

## 2021-10-22 NOTE — Telephone Encounter (Signed)
Requested medication (s) are due for refill today:   Not sure  Requested medication (s) are on the active medication list:   Yes in 2 doses 150 mg and 300 mg  Future visit scheduled:   Yes in 2 days with Kathrine Haddock   Last ordered: 150 mg, 07/23/2021 #90, 1 refill;    Another one for 150 mg 07/25/2021 #30, 0 refills,  Then one for 300 mg 07/23/2021 #90, 3 refills  Returning because wasn't able to determine which dose she is supposed to be on.   Pharmacy is requesting a 1 yr supply of whichever dose it is she is supposed to be on.    Thanks.   Requested Prescriptions  Pending Prescriptions Disp Refills   buPROPion (WELLBUTRIN XL) 150 MG 24 hr tablet [Pharmacy Med Name: buPROPion HCl ER (XL) 150 MG Oral Tablet Extended Release 24 Hour] 90 tablet 3    Sig: TAKE 1 TABLET BY MOUTH  DAILY     Psychiatry: Antidepressants - bupropion Failed - 10/22/2021  7:38 AM      Failed - Last BP in normal range    BP Readings from Last 1 Encounters:  09/26/21 (!) 144/84          Passed - Completed PHQ-2 or PHQ-9 in the last 360 days      Passed - Valid encounter within last 6 months    Recent Outpatient Visits           3 weeks ago Essential hypertension, benign   [facility] Kathrine Haddock, NP   2 months ago Essential hypertension, benign   [facility], Malachy Mood, NP   3 months ago Hypothyroidism, unspecified type   [place], NP   1 year ago Encounter for weight management   [facility] Delsa Grana, PA-C   1 year ago Hypothyroidism, unspecified type   [facility] Delsa Grana, PA-C       Future Appointments             In 2 days Kathrine Haddock, NP [facility], [facility]

## 2021-10-24 ENCOUNTER — Telehealth (INDEPENDENT_AMBULATORY_CARE_PROVIDER_SITE_OTHER): Payer: 59 | Admitting: Unknown Physician Specialty

## 2021-10-24 DIAGNOSIS — F3341 Major depressive disorder, recurrent, in partial remission: Secondary | ICD-10-CM | POA: Diagnosis not present

## 2021-10-24 DIAGNOSIS — E669 Obesity, unspecified: Secondary | ICD-10-CM | POA: Diagnosis not present

## 2021-10-24 DIAGNOSIS — I10 Essential (primary) hypertension: Secondary | ICD-10-CM | POA: Diagnosis not present

## 2021-10-24 NOTE — Assessment & Plan Note (Signed)
Stable, continue present medications.   

## 2021-10-24 NOTE — Assessment & Plan Note (Signed)
BP under good control.  Will cut Amlodipine to 2.5 mg for 2 weeks and then discontinue.  Pt will monitor BP with a goal of less then 130/90.

## 2021-10-24 NOTE — Progress Notes (Signed)
BP 104/79   Pulse 73   Ht 5' 10"  (1.778 m)   Wt 263 lb (119.3 kg)   BMI 37.74 kg/m    Subjective:    Patient ID: Natalie Petersen, female    DOB: May 05, 1976, 45 y.o.   MRN: [id number]  HPI: Natalie Petersen is a 45 y.o. female  Chief Complaint  Patient presents with   Hypertension    Checking bp at home   Due to the catastrophic nature of the COVID-19 pandemic, this visit was completed via audio and visual contact via [employer] due to the restrictions of the COVID-19 pandemic. All issues as above were discussed and addressed. Physical exam was done as above through visual confirmation on [employer]  If it was felt that the patient should be evaluated in the office, they were directed there. The patient verbally consented to this visit."} Location of the patient: home Location of the provider: work Those involved with this call:  Provider: Kathrine Haddock, DNP CTime spent on call: 20 minutes I verified patient identity using two factors (patient name and date of birth). Patient consents verbally to being seen via telemedicine visit today.  Weight loss - probably 6 pounds from last time I saw her.  Saxenda at 2.5 daily and sometimes 1.8 mgs as tolerated.  BP ranges from 101/71 to 129/77 with no chest pain or SOB.  Pulse is in the 70s.  Cut back to 1/2 of Amlodipine but noticed swollen ankles which resolved with increasing back to 5 mg.  She has a new URI for the last 3 days.    On high doses of Wellbutrin for depression but doing well  Depression screen [facility] 2/9 10/24/2021 09/26/2021 08/22/2021 07/23/2021 10/03/2020  Decreased Interest 0 0 1 1 0  Down, Depressed, Hopeless 0 0 0 0 0  PHQ - 2 Score 0 0 1 1 0  Altered sleeping 0 0 0 1 0  Tired, decreased energy 0 1 1 1  0  Change in appetite 0 0 0 1 0  Feeling bad or failure about yourself  0 0 0 1 0  Trouble concentrating 0 0 0 0 0  Moving slowly or fidgety/restless 0 0 0 0 0  Suicidal thoughts 0 0 0 0 0  PHQ-9 Score 0 1 2 5  0  Difficult  doing work/chores Not difficult at all Not difficult at all Not difficult at all Somewhat difficult Not difficult at all  Some recent data might be hidden     Relevant past medical, surgical, family and social history reviewed and updated as indicated. Interim medical history since our last visit reviewed. Allergies and medications reviewed and updated.  Review of Systems  Per HPI unless specifically indicated above     Objective:    BP 104/79   Pulse 73   Ht 5' 10"  (1.778 m)   Wt 263 lb (119.3 kg)   BMI 37.74 kg/m   Wt Readings from Last 3 Encounters:  10/24/21 263 lb (119.3 kg)  09/26/21 263 lb 1.6 oz (119.3 kg)  08/22/21 272 lb 4.8 oz (123.5 kg)    Physical Exam Constitutional:      General: She is not in acute distress.    Appearance: Normal appearance. She is well-developed.  HENT:     Head: Normocephalic and atraumatic.  Eyes:     General: Lids are normal. No scleral icterus.       Right eye: No discharge.        Left eye:  No discharge.     Conjunctiva/sclera: Conjunctivae normal.  Cardiovascular:     Rate and Rhythm: Normal rate.  Pulmonary:     Effort: Pulmonary effort is normal.  Abdominal:     Palpations: There is no hepatomegaly or splenomegaly.  Musculoskeletal:        General: Normal range of motion.  Skin:    Coloration: Skin is not pale.     Findings: No rash.  Neurological:     Mental Status: She is alert and oriented to person, place, and time.  Psychiatric:        Behavior: Behavior normal.        Thought Content: Thought content normal.        Judgment: Judgment normal.    Results for orders placed or performed in visit on 07/23/21  HM MAMMOGRAPHY  Result Value Ref Range   HM Mammogram 0-4 Bi-Rad 0-4 Bi-Rad, Self Reported Normal      Assessment & Plan:   Problem List Items Addressed This Visit       Unprioritized   Essential hypertension, benign (Chronic)    BP under good control.  Will cut Amlodipine to 2.5 mg for 2 weeks and  then discontinue.  Pt will monitor BP with a goal of less then 130/90.        MDD (major depressive disorder), recurrent, in partial remission ([place])    Stable, continue present medications.        Obesity (BMI 35.0-39.9 without comorbidity)    Continued weight loss.  Will continue Saxenda as tolerated        Follow up plan: Return in about 2 months (around 12/24/2021).

## 2021-10-24 NOTE — Assessment & Plan Note (Signed)
Continued weight loss.  Will continue Saxenda as tolerated

## 2021-11-05 ENCOUNTER — Encounter: Payer: Self-pay | Admitting: Unknown Physician Specialty

## 2021-11-27 ENCOUNTER — Encounter: Payer: Self-pay | Admitting: Family Medicine

## 2021-11-28 ENCOUNTER — Telehealth (INDEPENDENT_AMBULATORY_CARE_PROVIDER_SITE_OTHER): Payer: 59 | Admitting: Nurse Practitioner

## 2021-11-28 DIAGNOSIS — U071 COVID-19: Secondary | ICD-10-CM

## 2021-11-28 NOTE — Progress Notes (Signed)
Name: Natalie Petersen   MRN: [id number]    DOB: 04-10-1976   Date:11/28/2021       Progress Note  Subjective  Chief Complaint  Chief Complaint  Patient presents with   Covid Positive    Monday at home test- positive, pt states husband tested positive as well. Sx started on Sunday, denies fever   Cough   Nasal Congestion   Fatigue    I connected with  Meagen D Uram  on 11/28/21 at  1:00 PM EST by a video enabled telemedicine application and verified that I am speaking with the correct person using two identifiers.  I discussed the limitations of evaluation and management by telemedicine and the availability of in person appointments. The patient expressed understanding and agreed to proceed with a virtual visit  Staff also discussed with the patient that there may be a patient responsible charge related to this service. Patient Location: home Provider Location: [facility] Additional Individuals present: alone  HPI  Covid: She says that over the weekend she started noticing nasal congestion.  She tested for Covid on Monday and it was positive.  She says her symptoms include nasal congestion, non productive cough, fatigue, lost of taste and smell.  She says the first couple night she took robitussin for the cough to help her sleep but she feels like her cough has gotten better and is not taking any cough medication now.  She denies any fever, shortness of breath or chest pain.  She is taking vitamin c and zinc.  Discussed OTC treatments for symptoms.  Push fluids and get rest. Discussed CDC recommendations for quarantine.   Patient Active Problem List   Diagnosis Date Noted   Hypothyroid 07/23/2021   Fibroadenoma of left breast 08/31/2020   OSA (obstructive sleep apnea) 06/18/2020   MDD (major depressive disorder), recurrent, in partial remission ([place]) 06/28/2019   GAD (generalized anxiety disorder) 06/28/2019   LVH (left ventricular hypertrophy) 04/08/2018   Essential hypertension, benign  03/30/2018   Family history of ovarian cancer 10/26/2017   Abnormal mammogram of left breast 09/04/2015   Obesity (BMI 35.0-39.9 without comorbidity) 07/16/2015   Vitamin D deficiency 07/16/2015    Social History   Tobacco Use   Smoking status: Never   Smokeless tobacco: Never  Substance Use Topics   Alcohol use: Yes    Alcohol/week: 2.0 standard drinks    Types: 2 Glasses of wine per week     Current Outpatient Medications:    BD PEN NEEDLE NANO 2ND GEN 32G X 4 MM MISC, USE AS DIRECTED WITH SAXENDA DAILY, Disp: 100 each, Rfl: 1   buPROPion (WELLBUTRIN XL) 150 MG 24 hr tablet, Take 1 tablet (150 mg total) by mouth daily., Disp: 30 tablet, Rfl: 0   buPROPion (WELLBUTRIN XL) 150 MG 24 hr tablet, TAKE 1 TABLET BY MOUTH  DAILY, Disp: 90 tablet, Rfl: 3   buPROPion (WELLBUTRIN XL) 300 MG 24 hr tablet, Take 1 tablet (300 mg total) by mouth daily., Disp: 90 tablet, Rfl: 3   cholecalciferol (VITAMIN D3) 25 MCG (1000 UNIT) tablet, Take 5,000 Units by mouth daily., Disp: , Rfl:    EPINEPHrine 0.3 mg/0.3 mL IJ SOAJ injection, Inject 0.3 mg into the muscle as needed for anaphylaxis., Disp: 2 each, Rfl: 1   hydrochlorothiazide (HYDRODIURIL) 25 MG tablet, Take 1 tablet (25 mg total) by mouth daily., Disp: 90 tablet, Rfl: 3   levothyroxine (SYNTHROID) 100 MCG tablet, Take 1 tablet (100 mcg total) by mouth  daily., Disp: 90 tablet, Rfl: 3   Liraglutide -Weight Management (SAXENDA) 18 MG/3ML SOPN, Inject 3 mg into the skin daily, Disp: 45 mL, Rfl: 3   vitamin B-12 (CYANOCOBALAMIN) 1000 MCG tablet, Take 3,000 mcg by mouth daily., Disp: , Rfl:   Allergies  Allergen Reactions   Ace Inhibitors Swelling   Lisinopril Swelling   Amoxicillin Hives and Swelling   Benadryl [Diphenhydramine Hcl (Sleep)] Hives and Swelling    I personally reviewed active problem list, medication list, allergies with the patient/caregiver today.  ROS  Constitutional: Negative for fever or weight change.  HEENT: positive  for sinus congestion Respiratory: Positive for cough, negative for shortness of breath.   Cardiovascular: Negative for chest pain or palpitations.  Gastrointestinal: Negative for abdominal pain, no bowel changes.  Musculoskeletal: Negative for gait problem or joint swelling.  Skin: Negative for rash.  Neurological: Negative for dizziness or headache.  No other specific complaints in a complete review of systems (except as listed in HPI above).   Objective  Virtual encounter, vitals not obtained.  There is no height or weight on file to calculate BMI.  Nursing Note and Vital Signs reviewed.  Physical Exam  Awake, alert and oriented, speaking in complete sentences  No results found for this or any previous visit (from the past 72 hour(s)).  Assessment & Plan  1. COVID-19 -continue taking vitamin c and zinc -OTC treatments for symptoms discussed -push fluids and get rest  -Red flags and when to present for emergency care or RTC including fever >101.59F, chest pain, shortness of breath, new/worsening/un-resolving symptoms, reviewed with patient at time of visit. Follow up and care instructions discussed and provided in AVS. - I discussed the assessment and treatment plan with the patient. The patient was provided an opportunity to ask questions and all were answered. The patient agreed with the plan and demonstrated an understanding of the instructions.  I provided 15 minutes of non-face-to-face time during this encounter.  Bo Merino, FNP

## 2021-12-27 ENCOUNTER — Encounter: Payer: Self-pay | Admitting: Family Medicine

## 2022-01-15 ENCOUNTER — Encounter: Payer: Self-pay | Admitting: Family Medicine

## 2022-01-15 ENCOUNTER — Other Ambulatory Visit: Payer: Self-pay | Admitting: Family Medicine

## 2022-01-15 DIAGNOSIS — E8881 Metabolic syndrome: Secondary | ICD-10-CM

## 2022-01-16 ENCOUNTER — Other Ambulatory Visit: Payer: Self-pay | Admitting: Family Medicine

## 2022-01-16 DIAGNOSIS — E8881 Metabolic syndrome: Secondary | ICD-10-CM

## 2022-02-10 ENCOUNTER — Ambulatory Visit (INDEPENDENT_AMBULATORY_CARE_PROVIDER_SITE_OTHER): Payer: 59 | Admitting: Physician Assistant

## 2022-02-10 ENCOUNTER — Encounter: Payer: Self-pay | Admitting: Physician Assistant

## 2022-02-10 VITALS — BP 128/82 | HR 93 | Temp 98.3°F | Resp 16 | Ht 70.0 in | Wt 260.9 lb

## 2022-02-10 DIAGNOSIS — E669 Obesity, unspecified: Secondary | ICD-10-CM

## 2022-02-10 DIAGNOSIS — E8881 Metabolic syndrome: Secondary | ICD-10-CM | POA: Diagnosis not present

## 2022-02-10 DIAGNOSIS — I10 Essential (primary) hypertension: Secondary | ICD-10-CM | POA: Diagnosis not present

## 2022-02-10 DIAGNOSIS — E039 Hypothyroidism, unspecified: Secondary | ICD-10-CM

## 2022-02-10 DIAGNOSIS — T782XXD Anaphylactic shock, unspecified, subsequent encounter: Secondary | ICD-10-CM

## 2022-02-10 MED ORDER — SAXENDA 18 MG/3ML ~~LOC~~ SOPN: PEN_INJECTOR | SUBCUTANEOUS | 3 refills | Status: DC

## 2022-02-10 MED ORDER — EPINEPHRINE 0.3 MG/0.3ML IJ SOAJ: 0.3000 mg | INTRAMUSCULAR | 1 refills | Status: DC | PRN

## 2022-02-10 NOTE — Assessment & Plan Note (Signed)
Chronic, ongoing condition Currently taking Saxenda for weight loss assistance Recommend continuation of current medication, exercise and diet regimen Lab work in the next few months per patient preference- A1c, CMP, TSH, CBC for monitoring

## 2022-02-10 NOTE — Assessment & Plan Note (Signed)
Chronic condition currently stable  Well managed with current regimen Continue current medications  Discussed continuation of exercise and diet to assist with cardiovascular health

## 2022-02-10 NOTE — Progress Notes (Signed)
Established Patient Office Visit  Subjective:  Patient ID: Natalie Petersen, female    DOB: [birth year]  Age: 46 y.o. MRN: [id number]  Today's Provider: Talitha Petersen, MHS, PA-C Introduced myself to the patient as a PA-C and provided education on APPs in clinical practice.     CC:  Chief Complaint  Patient presents with   Follow-up   Hypertension   Obesity    HPI 75 D Natalie Petersen presents for follow up for Hypertension, hypothyroidism, and obesity   Hypertension States it is always elevated when she comes to office At home is is 115-118/ 75-81  Reports good compliance with HCTZ  Obesity  Exercising 4 times per week. She is walking and is incorporating strength training too. States she walks approx 3-4 miles per session  Diet: states she is trying to reduce fat, increasing lean protein, vegetables , fruits She states she stopped Saxenda over the holidays but restarted in the last month States she is feeling good on Saxenda but has not gone above 2.4 - states if she goes above this dose she has no appetite whatsoever Down 12 lbs since Sept 2022   Hypothyroid States she has been feeling a bit tired lately and has tried supplementation with vitamins for this Is concerned her Levothyroxine is a bit high in terms of dose Is amenable to getting labs at later apt      Past Medical History:  Diagnosis Date   Allergy    seasonal   Anxiety    Depression    Elevated hematocrit 10/28/2018   Refer to pulm   Menopausal symptoms    Metrorrhagia    Obesity, Class III, BMI 40-49.9 (morbid obesity) ([place]) 07/16/2015   Vitamin D deficiency     Past Surgical History:  Procedure Laterality Date   BRAIN SURGERY      Family History  Problem Relation Age of Onset   Heart murmur Mother    Atrial fibrillation Mother    Arthritis Father        RA   Hyperlipidemia Father    Cancer Maternal Aunt        ovarian and uterine    Arthritis Maternal Grandmother    Heart attack Maternal  Grandfather    Stroke Paternal Grandmother    Arthritis Paternal Grandmother    Breast cancer Paternal Grandmother    Stroke Paternal Uncle     Social History   Socioeconomic History   Marital status: Married    Spouse name: Natalie Petersen   Number of children: 1   Years of education: Not on file   Highest education level: Master's degree (e.g., MA, MS, MEng, MEd, MSW, MBA)  Occupational History    Comment: full time  Tobacco Use   Smoking status: Never   Smokeless tobacco: Never  Vaping Use   Vaping Use: Never used  Substance and Sexual Activity   Alcohol use: Yes    Alcohol/week: 2.0 standard drinks    Types: 2 Glasses of wine per week   Drug use: No   Sexual activity: Yes    Partners: Male    Birth control/protection: Pill  Other Topics Concern   Not on file  Social History Narrative   Not on file   Social Determinants of Health   Financial Resource Strain: Not on file  Food Insecurity: Not on file  Transportation Needs: Not on file  Physical Activity: Not on file  Stress: Not on file  Social Connections: Not on file  Intimate  Partner Violence: Not on file    Outpatient Medications Prior to Visit  Medication Sig Dispense Refill   BD PEN NEEDLE NANO 2ND GEN 32G X 4 MM MISC USE AS DIRECTED WITH SAXENDA DAILY 100 each 1   buPROPion (WELLBUTRIN XL) 150 MG 24 hr tablet Take 1 tablet (150 mg total) by mouth daily. 30 tablet 0   buPROPion (WELLBUTRIN XL) 150 MG 24 hr tablet TAKE 1 TABLET BY MOUTH  DAILY 90 tablet 3   buPROPion (WELLBUTRIN XL) 300 MG 24 hr tablet Take 1 tablet (300 mg total) by mouth daily. 90 tablet 3   hydrochlorothiazide (HYDRODIURIL) 25 MG tablet Take 1 tablet (25 mg total) by mouth daily. 90 tablet 3   levothyroxine (SYNTHROID) 100 MCG tablet Take 1 tablet (100 mcg total) by mouth daily. 90 tablet 3   EPINEPHrine 0.3 mg/0.3 mL IJ SOAJ injection Inject 0.3 mg into the muscle as needed for anaphylaxis. 2 each 1   Liraglutide -Weight Management  (SAXENDA) 18 MG/3ML SOPN Inject 3 mg into the skin daily 45 mL 3   cholecalciferol (VITAMIN D3) 25 MCG (1000 UNIT) tablet Take 5,000 Units by mouth daily. (Patient not taking: Reported on 02/10/2022)     vitamin B-12 (CYANOCOBALAMIN) 1000 MCG tablet Take 3,000 mcg by mouth daily. (Patient not taking: Reported on 02/10/2022)     No facility-administered medications prior to visit.    Allergies  Allergen Reactions   Ace Inhibitors Swelling   Lisinopril Swelling   Amoxicillin Hives and Swelling   Benadryl [Diphenhydramine Hcl (Sleep)] Hives and Swelling    ROS Review of Systems  Constitutional:  Negative for diaphoresis, fatigue and fever.  Respiratory:  Negative for cough and shortness of breath.   Cardiovascular:  Negative for chest pain, palpitations and leg swelling.  Musculoskeletal:  Negative for back pain, neck pain and neck stiffness.  Neurological:  Negative for dizziness, light-headedness and headaches.     Objective:    Physical Exam Vitals reviewed.  Constitutional:      Appearance: Normal appearance.  HENT:     Head: Normocephalic and atraumatic.  Neck:     Thyroid: No thyroid mass, thyromegaly or thyroid tenderness.  Cardiovascular:     Rate and Rhythm: Normal rate and regular rhythm.     Pulses: Normal pulses.     Heart sounds: Normal heart sounds. No murmur heard.   No gallop.  Pulmonary:     Effort: Pulmonary effort is normal.     Breath sounds: Normal breath sounds. No wheezing, rhonchi or rales.  Musculoskeletal:     Cervical back: Normal range of motion and neck supple.  Lymphadenopathy:     Head:     Right side of head: No submental or submandibular adenopathy.     Left side of head: No submental or submandibular adenopathy.     Upper Body:     Right upper body: No supraclavicular adenopathy.     Left upper body: No supraclavicular adenopathy.  Neurological:     Mental Status: She is alert.    BP 128/82    Pulse 93    Temp 98.3 F (36.8 C)  (Oral)    Resp 16    Ht 5' 10"  (1.778 m)    Wt 260 lb 14.4 oz (118.3 kg)    SpO2 98%    BMI 37.44 kg/m  Wt Readings from Last 3 Encounters:  02/10/22 260 lb 14.4 oz (118.3 kg)  10/24/21 263 lb (119.3 kg)  09/26/21 263 lb  1.6 oz (119.3 kg)     There are no preventive care reminders to display for this patient.  There are no preventive care reminders to display for this patient.  Lab Results  Component Value Date   TSH 2.00 07/23/2021   Lab Results  Component Value Date   WBC 8.4 07/24/2020   HGB 14.6 07/24/2020   HCT 44.0 07/24/2020   MCV 86.1 07/24/2020   PLT 362 07/24/2020   Lab Results  Component Value Date   NA 137 07/23/2021   K 4.4 07/23/2021   CO2 29 07/23/2021   GLUCOSE 106 (H) 07/23/2021   BUN 12 07/23/2021   CREATININE 0.78 07/23/2021   BILITOT 0.7 07/23/2021   ALKPHOS 92 02/11/2018   AST 12 07/23/2021   ALT 14 07/23/2021   PROT 6.7 07/23/2021   ALBUMIN 4.1 02/11/2018   CALCIUM 9.5 07/23/2021   ANIONGAP 8 03/15/2018   EGFR 95 07/23/2021   Lab Results  Component Value Date   CHOL 217 (H) 07/23/2021   Lab Results  Component Value Date   HDL 69 07/23/2021   Lab Results  Component Value Date   LDLCALC 118 (H) 07/23/2021   Lab Results  Component Value Date   TRIG 178 (H) 07/23/2021   Lab Results  Component Value Date   CHOLHDL 3.1 07/23/2021   Lab Results  Component Value Date   HGBA1C 5.5 10/28/2018      Assessment & Plan:   Problem List Items Addressed This Visit       Cardiovascular and Mediastinum   Essential hypertension, benign (Chronic)    Chronic condition currently stable  Well managed with current regimen Continue current medications  Discussed continuation of exercise and diet to assist with cardiovascular health       Relevant Medications   EPINEPHrine 0.3 mg/0.3 mL IJ SOAJ injection     Endocrine   Hypothyroid    Chronic condition currently stable  Well managed with current regimen Continue current medications   Lab work in the next few months for monitoring per patient preference Follow up in 6 months         Other   Obesity (BMI 35.0-39.9 without comorbidity) - Primary    Chronic, ongoing condition Currently taking Saxenda for weight loss assistance Recommend continuation of current medication, exercise and diet regimen Lab work in the next few months per patient preference- A1c, CMP, TSH, CBC for monitoring        Relevant Medications   Liraglutide -Weight Management (SAXENDA) 18 MG/3ML SOPN   Other Visit Diagnoses     Anaphylaxis, subsequent encounter       pt request epi pen refill   Relevant Medications   EPINEPHrine 0.3 mg/0.3 mL IJ SOAJ injection   Metabolic syndrome       Relevant Medications   Liraglutide -Weight Management (SAXENDA) 18 MG/3ML SOPN       Meds ordered this encounter  Medications   EPINEPHrine 0.3 mg/0.3 mL IJ SOAJ injection    Sig: Inject 0.3 mg into the muscle as needed for anaphylaxis.    Dispense:  2 each    Refill:  1   DISCONTD: Liraglutide -Weight Management (SAXENDA) 18 MG/3ML SOPN    Sig: Inject 3 mg into the skin daily    Dispense:  45 mL    Refill:  3    Obesity, metabolic syndrome, BMI > 30 (41.41) and pt has tried diet/lifestyle and nutritional programs   Liraglutide -Weight Management (SAXENDA) 18 MG/3ML SOPN  Sig: Inject 3 mg into the skin daily    Dispense:  45 mL    Refill:  3    Obesity, metabolic syndrome, BMI > 30 (41.41) and pt has tried diet/lifestyle and nutritional programs    Follow-up: Return in about 3 months (around 05/10/2022) for Annual physical and labs.    I, Jasey Cortez E Orion Mole, PA-C, have reviewed all documentation for this visit. The documentation on 02/10/22 for the exam, diagnosis, procedures, and orders are all accurate and complete.    Stanely Sexson E Adayah Arocho, PA-C

## 2022-02-10 NOTE — Assessment & Plan Note (Signed)
Chronic condition currently stable  Well managed with current regimen Continue current medications  Lab work in the next few months for monitoring per patient preference Follow up in 6 months

## 2022-02-10 NOTE — Patient Instructions (Addendum)
°  Here is a resource for sharps disposal locations and instructions [email or web address]  It was nice to meet you and I appreciate the opportunity to be involved in your care

## 2022-02-11 NOTE — Telephone Encounter (Signed)
Can we start a prior auth for this to see if that will help with cost? I can also talk to Cristie Hem to see if there is a cost assistance program before we change her regimen too much.

## 2022-02-26 ENCOUNTER — Encounter: Payer: Self-pay | Admitting: Family Medicine

## 2022-02-26 ENCOUNTER — Other Ambulatory Visit: Payer: Self-pay

## 2022-02-26 DIAGNOSIS — D242 Benign neoplasm of left breast: Secondary | ICD-10-CM

## 2022-05-02 LAB — HM MAMMOGRAPHY: HM Mammogram: ABNORMAL — AB (ref 0–4)

## 2022-05-05 ENCOUNTER — Other Ambulatory Visit: Payer: Self-pay | Admitting: Unknown Physician Specialty

## 2022-05-05 DIAGNOSIS — E039 Hypothyroidism, unspecified: Secondary | ICD-10-CM

## 2022-05-07 NOTE — Telephone Encounter (Signed)
Rx 07/23/21 #90 #RF- too soon ?Requested Prescriptions  ?Pending Prescriptions Disp Refills  ?? levothyroxine (SYNTHROID) 100 MCG tablet [Pharmacy Med Name: Levothyroxine Sodium 100 MCG Oral Tablet] 90 tablet 3  ?  Sig: TAKE 1 TABLET BY MOUTH  DAILY  ?  ? Endocrinology:  Hypothyroid Agents Passed - 05/05/2022 10:47 PM  ?  ?  Passed - TSH in normal range and within 360 days  ?  TSH  ?Date Value Ref Range Status  ?07/23/2021 2.00 mIU/L Final  ?  Comment:  ?            Reference Range ?. ?          > or = 20 Years  0.40-4.50 ?. ?               Pregnancy Ranges ?          First trimester    0.26-2.66 ?          Second trimester   0.55-2.73 ?          Third trimester    0.43-2.91 ?  ?   ?  ?  Passed - Valid encounter within last 12 months  ?  Recent Outpatient Visits   ?      ? 2 months ago Obesity (BMI 35.0-39.9 without comorbidity)  ? [facility] Mecum, Erin E, PA-C  ? 5 months ago COVID-19  ? [place], FNP  ? 6 months ago Essential hypertension, benign  ? [place], NP  ? 7 months ago Essential hypertension, benign  ? [place], NP  ? 8 months ago Essential hypertension, benign  ? [facility] Kathrine Haddock, NP  ?  ?  ?Future Appointments   ?        ? In 3 months Mecum, Dani Gobble, PA-C [facility], [place]  ?  ? ?  ?  ?  ? ?

## 2022-05-08 ENCOUNTER — Other Ambulatory Visit: Payer: Self-pay | Admitting: Unknown Physician Specialty

## 2022-05-08 DIAGNOSIS — F3341 Major depressive disorder, recurrent, in partial remission: Secondary | ICD-10-CM

## 2022-05-08 NOTE — Telephone Encounter (Signed)
Requested Prescriptions  Pending Prescriptions Disp Refills  . buPROPion (WELLBUTRIN XL) 300 MG 24 hr tablet [Pharmacy Med Name: buPROPion HCl ER (XL) 300 MG Oral Tablet Extended Release 24 Hour] 90 tablet 3    Sig: TAKE 1 TABLET BY MOUTH  DAILY     Psychiatry: Antidepressants - bupropion Passed - 05/08/2022  6:52 AM      Passed - Cr in normal range and within 360 days    Creat  Date Value Ref Range Status  07/23/2021 0.78 0.50 - 0.99 mg/dL Final         Passed - AST in normal range and within 360 days    AST  Date Value Ref Range Status  07/23/2021 12 10 - 35 U/L Final         Passed - ALT in normal range and within 360 days    ALT  Date Value Ref Range Status  07/23/2021 14 6 - 29 U/L Final         Passed - Completed PHQ-2 or PHQ-9 in the last 360 days      Passed - Last BP in normal range    BP Readings from Last 1 Encounters:  02/10/22 128/82         Passed - Valid encounter within last 6 months    Recent Outpatient Visits          2 months ago Obesity (BMI 35.0-39.9 without comorbidity)   [place], PA-C   5 months ago [place], FNP   6 months ago Essential hypertension, benign   [facility] Kathrine Haddock, NP   7 months ago Essential hypertension, benign   [place], NP   8 months ago Essential hypertension, benign   [place], NP      Future Appointments            In 3 months Mecum, Dani Gobble, PA-C [facility], [facility]

## 2022-05-12 ENCOUNTER — Encounter: Payer: 59 | Admitting: Physician Assistant

## 2022-07-08 ENCOUNTER — Other Ambulatory Visit: Payer: Self-pay | Admitting: Unknown Physician Specialty

## 2022-07-08 DIAGNOSIS — I10 Essential (primary) hypertension: Secondary | ICD-10-CM

## 2022-07-09 ENCOUNTER — Other Ambulatory Visit: Payer: Self-pay

## 2022-07-09 DIAGNOSIS — I10 Essential (primary) hypertension: Secondary | ICD-10-CM

## 2022-07-09 NOTE — Telephone Encounter (Signed)
Requested medication (s) are due for refill today: yes  Requested medication (s) are on the active medication list: yes  Last refill:  07/23/21 #90 3 RF  Future visit scheduled: yes  Notes to clinic:  overdue lab work   Requested Prescriptions  Pending Prescriptions Disp Refills   hydrochlorothiazide (HYDRODIURIL) 25 MG tablet [Pharmacy Med Name: hydroCHLOROthiazide 25 MG Oral Tablet] 90 tablet 3    Sig: TAKE 1 TABLET BY MOUTH  DAILY     Cardiovascular: Diuretics - Thiazide Failed - 07/08/2022 11:14 PM      Failed - Cr in normal range and within 180 days    Creat  Date Value Ref Range Status  07/23/2021 0.78 0.50 - 0.99 mg/dL Final         Failed - K in normal range and within 180 days    Potassium  Date Value Ref Range Status  07/23/2021 4.4 3.5 - 5.3 mmol/L Final         Failed - Na in normal range and within 180 days    Sodium  Date Value Ref Range Status  07/23/2021 137 135 - 146 mmol/L Final  04/20/2018 141 134 - 144 mmol/L Final         Passed - Last BP in normal range    BP Readings from Last 1 Encounters:  02/10/22 128/82         Passed - Valid encounter within last 6 months    Recent Outpatient Visits           4 months ago Obesity (BMI 35.0-39.9 without comorbidity)   [place], Dani Gobble, PA-C   7 months ago [place], FNP   8 months ago Essential hypertension, benign   [facility] Kathrine Haddock, NP   9 months ago Essential hypertension, benign   [facility] Kathrine Haddock, NP   10 months ago Essential hypertension, benign   [facility] Kathrine Haddock, NP       Future Appointments             In 1 month Delsa Grana, PA-C [facility], [facility]

## 2022-07-16 ENCOUNTER — Ambulatory Visit: Payer: Self-pay

## 2022-07-16 NOTE — Telephone Encounter (Signed)
Pt called, LVMTCB to discuss travels with a nurse regarding medication requested.   Summary: unexpectedly having to travel out of the country/requesting preventative medication.   Pt stated she is unexpectedly having to travel out of the country to [place] due to a death in the family. Pt is requesting preventative medication.Pt stated she is leaving tomorrow morning. Pt mentioned that malaria is a concern in this country and requested a z-pack.    Pt mentioned she had dengue fever in the past and knows she can possibly get it again.    Pt seeking clinical advice.

## 2022-07-16 NOTE — Telephone Encounter (Signed)
Pt notified and # given

## 2022-07-16 NOTE — Telephone Encounter (Signed)
  Chief Complaint: medication request Symptoms: NA Frequency: traveling to [place] tomorrow for 11-12 days d/t death Disposition: '[]'$ ED /'[]'$ Urgent Care (no appt availability in office) / '[]'$ Appointment(In office/virtual)/ '[]'$  [facility]/ '[]'$ Home Care/ '[]'$ Refused Recommended Disposition /'[]'$ [facility]/ '[x]'$  Follow-up with PCP Additional Notes: pt is requesting some type of preventative abx since she is traveling to [place] leaving in the morning d/t death in family and will be there 11-12 days. Pt hasn't traveled there in several years but last time got very mulitple times. Pt is allergic to [facility] but is asking if something can be prescribed for just in case scenario. Pt is wanting [facility]. She would like CB if something is sent in as well. Advised her I will send message back and someone can fu after lunch.   Summary: unexpectedly having to travel out of the country/requesting preventative medication.    Pt stated she is unexpectedly having to travel out of the country to [place] due to a death in the family. Pt is requesting preventative medication.Pt stated she is leaving tomorrow morning. Pt mentioned that malaria is a concern in this country and requested a z-pack.    Pt mentioned she had dengue fever in the past and knows she can possibly get it again.    Pt seeking clinical advice.       Reason for Disposition  Prescription request for new medicine (not a refill)  Answer Assessment - Initial Assessment Questions 1. NAME of MEDICINE: "What medicine(s) are you calling about?"     Prevention abx 2. QUESTION: "What is your question?" (e.g., double dose of medicine, side effect)     Traveling to [place] leaving to  3. PRESCRIBER: "Who prescribed the medicine?" Reason: if prescribed by specialist, call should be referred to that group.     [place], PA 4. SYMPTOMS: "Do you have any symptoms?" If Yes, ask: "What symptoms are you having?"  "How bad are the  symptoms (e.g., mild, moderate, severe)     NA  Protocols used: Medication Question Call-A-AH

## 2022-07-28 ENCOUNTER — Ambulatory Visit: Payer: Self-pay | Admitting: *Deleted

## 2022-07-28 NOTE — Telephone Encounter (Signed)
Summary: dengue fever   Pt was out of the country and thinks she may have Dengue fever / she is experiencing body aches, fatigue and growth on her tonsils / pt asked for an appt but the office stated she would have to go to the health dept / please advise if needed      No answer, message left.

## 2022-07-28 NOTE — Telephone Encounter (Signed)
  Chief Complaint: dengue fever Symptoms: sore throat with patches on back of tonsil, fatigue, body and joint aches Frequency: 2-3 days Pertinent Negatives: Patient denies fever Disposition: '[]'$ ED /'[x]'$ Urgent Care (no appt availability in office) / '[]'$ Appointment(In office/virtual)/ '[]'$  [facility]/ '[]'$ Home Care/ '[]'$ Refused Recommended Disposition /'[]'$ [facility]/ '[]'$  Follow-up with PCP Additional Notes: pt states she flew back in yesterday. Pt went to [facility] and was swabbed for strep, rapid came back negative but culute wont be back for few days. Pt called [facility] and was advised they dont do testing for Dengue Fever. Pt is just wanting lab test done since her sx are similar. Called and spoke with Melissa, [facility] who advised pt can schedule appt and do testing. Advised pt of this and offered appt for 07/31/22 but pt states she already scheduled appt with UC at 0930 tomorrow morning.   Summary: dengue fever    [place] was out [place] the country and thinks she may have Dengue fever / she is experiencing body aches, fatigue and growth on her tonsils / pt asked for an appt but the office stated she would have to go to the health dept / please advise if needed      Reason for Disposition  [1] Sore throat is the only symptom AND [2] present > 48 hours  Answer Assessment - Initial Assessment Questions 1. ONSET: "When did the throat start hurting?" (Hours or days ago)      2-3 days  2. SEVERITY: "How bad is the sore throat?" (Scale 1-10; mild, moderate or severe)   - MILD (1-3):  Doesn't interfere with eating or normal activities.   - MODERATE (4-7): Interferes with eating some solids and normal activities.   - SEVERE (8-10):  Excruciating pain, interferes with most normal activities.   - SEVERE WITH DYSPHAGIA (10): Can't swallow liquids, drooling.     Mild to moderate  3. STREP EXPOSURE: "Has there been any exposure to strep within the past week?" If Yes, ask: "What type of contact  occurred?"      no 4.  VIRAL SYMPTOMS: "Are there any symptoms of a cold, such as a runny nose, cough, hoarse voice or red eyes?"      *No Answer* 5. FEVER: "Do you have a fever?" If Yes, ask: "What is your temperature, how was it measured, and when did it start?"     no 6. PUS ON THE TONSILS: "Is there pus on the tonsils in the back of your throat?"     Yes  7. OTHER SYMPTOMS: "Do you have any other symptoms?" (e.g., difficulty breathing, headache, rash)     Body and joint pain, fatigue  Protocols used: Sore Throat-A-AH

## 2022-07-29 ENCOUNTER — Encounter: Payer: Self-pay | Admitting: Family Medicine

## 2022-07-30 ENCOUNTER — Telehealth (INDEPENDENT_AMBULATORY_CARE_PROVIDER_SITE_OTHER): Payer: 59 | Admitting: Family Medicine

## 2022-07-30 ENCOUNTER — Encounter: Payer: Self-pay | Admitting: Family Medicine

## 2022-07-30 VITALS — BP 124/89 | HR 64

## 2022-07-30 DIAGNOSIS — Z8619 Personal history of other infectious and parasitic diseases: Secondary | ICD-10-CM | POA: Diagnosis not present

## 2022-07-30 DIAGNOSIS — Z209 Contact with and (suspected) exposure to unspecified communicable disease: Secondary | ICD-10-CM | POA: Diagnosis not present

## 2022-07-30 DIAGNOSIS — B349 Viral infection, unspecified: Secondary | ICD-10-CM

## 2022-07-30 NOTE — Patient Instructions (Addendum)
Leoda -   I ordered the labs I could find - they are through [facility] and can be done in office with a blood draw - I would come this week if you would like to complete them  Per CDC: If you think you have dengue [PDF - 1 page] See a healthcare provider if you develop a fever or have symptoms of dengue. Tell him or her about your travel. Rest as much as possible. Take acetaminophen (also known as paracetamol outside of the [place]) to control fever and relieve pain. Do not take aspirin or ibuprofen! Drink plenty of fluids to stay hydrated. Drink water or drinks with added electrolytes. For mild symptoms, care for a sick infant, child, or family member at home. Sign for emergency room Symptoms of dengue can become severe within a few hours. Severe dengue is a medical emergency.  Severe dengue About 1 in 58 people who get sick with dengue will develop severe dengue. Severe dengue can result in shock, internal bleeding, and even death. If you have had dengue in the past, you are more likely to develop severe dengue. Infants and pregnant women are at higher risk for developing severe dengue. Symptoms of severe dengue Warning signs of severe dengue  Watch for signs and symptoms of severe dengue. Warning signs usually begin in the 24-48 hours after your fever has gone away.  Immediately go to a local clinic or emergency room if you or a family member has any of the following symptoms.  Belly pain, tenderness Vomiting (at least 3 times in 24 hours) Bleeding from the nose or gums Vomiting blood, or blood in the stool Feeling tired, restless, or irritable

## 2022-07-30 NOTE — Progress Notes (Signed)
Name: Natalie Petersen   MRN: [id number]    DOB: [birth year]   Date:07/30/2022       Progress Note  Subjective:    Chief Complaint  Chief Complaint  Patient presents with   Possible dengue    Joint, muscle, head, eye pain recently traveled to [place] got bitten by mosquitos. Has had Dengue before and wants to make sure she does not have it since symptoms tend to relate as what she experienced before.   Fatigue    I connected with  Khloie Hamada Haywood on 07/30/22 at  3:40 PM EDT by telephone and verified that I am speaking with the correct person using two identifiers.   I discussed the limitations, risks, security and privacy concerns of performing an evaluation and management service by telephone and the availability of in person appointments. Staff also discussed with the patient that there may be a patient responsible charge related to this service.  Patient verbalized understanding and agreed to proceed with encounter. Patient Location: home Provider Location: [facility] clinic Additional Individuals present: none  HPI  Traveled 7/28 and returned 8/6 - traveled to [place] - dengue in the area a current problem, despite bug spray etc she had multiple mosquito bites 7/30-7/31, then developed various sx - Joint pain, fatigue, sore throat, ear pain [place] - had dengue in the past - tried UC, ID specialists in multiple systems - could not get seen or tested anywhere [facility] throat, spots on tonsils - rapid strep was neg, culture still pending  Hand, elbow ankles aches and generalized body ached diarrhea No cough or sweats  No rashes, bleeding  Her sx have improved a little bit with rest and OTC meds   Next Friday appt here in clinic for routine f/up   Patient Active Problem List   Diagnosis Date Noted   Hypothyroid 07/23/2021   Fibroadenoma of left breast 08/31/2020   OSA (obstructive sleep apnea) 06/18/2020   MDD (major depressive disorder), recurrent, in  partial remission ([place]) 06/28/2019   GAD (generalized anxiety disorder) 06/28/2019   LVH (left ventricular hypertrophy) 04/08/2018   Essential hypertension, benign 03/30/2018   Family history of ovarian cancer 10/26/2017   Abnormal mammogram of left breast 09/04/2015   Obesity (BMI 35.0-39.9 without comorbidity) 07/16/2015   Vitamin D deficiency 07/16/2015    Social History   Tobacco Use   Smoking status: Never   Smokeless tobacco: Never  Substance Use Topics   Alcohol use: Yes    Alcohol/week: 2.0 standard drinks of alcohol    Types: 2 Glasses of wine per week     Current Outpatient Medications:    BD PEN NEEDLE NANO 2ND GEN 32G X 4 MM MISC, USE AS DIRECTED WITH SAXENDA DAILY, Disp: 100 each, Rfl: 1   buPROPion (WELLBUTRIN XL) 150 MG 24 hr tablet, Take 1 tablet (150 mg total) by mouth daily., Disp: 30 tablet, Rfl: 0   buPROPion (WELLBUTRIN XL) 150 MG 24 hr tablet, TAKE 1 TABLET BY MOUTH  DAILY, Disp: 90 tablet, Rfl: 3   buPROPion (WELLBUTRIN XL) 300 MG 24 hr tablet, TAKE 1 TABLET BY MOUTH  DAILY, Disp: 90 tablet, Rfl: 3   cyanocobalamin 1000 MCG tablet, Take 1,000 mcg by mouth daily., Disp: , Rfl:    EPINEPHrine 0.3 mg/0.3 mL IJ SOAJ injection, Inject 0.3 mg into the muscle as needed for anaphylaxis., Disp: 2 each, Rfl: 1   hydrochlorothiazide (HYDRODIURIL) 25 MG tablet, TAKE 1 TABLET BY  MOUTH  DAILY, Disp: 90 tablet, Rfl: 0   levothyroxine (SYNTHROID) 100 MCG tablet, TAKE 1 TABLET BY MOUTH  DAILY, Disp: 90 tablet, Rfl: 3   Vitamin D, Ergocalciferol, 50000 units CAPS, Take by mouth., Disp: , Rfl:    Liraglutide -Weight Management (SAXENDA) 18 MG/3ML SOPN, Inject 3 mg into the skin daily, Disp: 45 mL, Rfl: 3  Allergies  Allergen Reactions   Ace Inhibitors Swelling   Lisinopril Swelling   Amoxicillin Hives and Swelling   Benadryl [Diphenhydramine Hcl (Sleep)] Hives and Swelling    Chart Review: I personally reviewed active problem list, medication list, allergies, family  history, social history, health maintenance, notes from last encounter, lab results, imaging with the patient/caregiver today.   Review of Systems  Constitutional: Negative.   HENT: Negative.    Eyes: Negative.   Respiratory: Negative.    Cardiovascular: Negative.   Gastrointestinal: Negative.   Endocrine: Negative.   Genitourinary: Negative.   Musculoskeletal: Negative.   Skin: Negative.   Allergic/Immunologic: Negative.   Neurological: Negative.   Hematological: Negative.   Psychiatric/Behavioral: Negative.    All other systems reviewed and are negative.    Objective:    Virtual encounter, vitals limited, only able to obtain the following Today's Vitals   07/30/22 1355  BP: 124/89  Pulse: 64   There is no height or weight on file to calculate BMI. Nursing Note and Vital Signs reviewed.  Physical Exam Vitals and nursing note reviewed.  Neck:     Trachea: Phonation normal.  Pulmonary:     Effort: No respiratory distress.  Neurological:     Mental Status: She is alert.  Psychiatric:        Mood and Affect: Mood normal.     PE limited by telephone encounter  No results found for this or any previous visit (from the past 72 hour(s)).  Assessment and Plan:     ICD-10-CM   1. Viral illness  B34.9 Dengue Fever Abs and NS1 Ag    2. Hx of dengue  Z86.19 Dengue Fever Abs and NS1 Ag    3. Contact with and (suspected) exposure to unspecified communicable disease  Z20.9 Dengue Fever Abs and NS1 Ag     Pt seems to be having some improvement with supportive measures, no fever, petechia, bleeding Strep culture pending from UC visit from Monday  (2d ago) Discussed limitations of testing, and how it will not affect current medical decision making with lack of treatment for dengue  Similarly other viral illness testing is limited  Plan to continue monitoring and supportive and symptomatic care, pt will need to present to ED with concerns of worsening sx - she had  confirmed prior dengue and a second infection can be worse - so we discussed monitoring sx closely   Per CDC: If you think you have dengue [PDF - 1 page] See a healthcare provider if you develop a fever or have symptoms of dengue. Tell him or her about your travel. Rest as much as possible. Take acetaminophen (also known as paracetamol outside of the [place]) to control fever and relieve pain. Do not take aspirin or ibuprofen! Drink plenty of fluids to stay hydrated. Drink water or drinks with added electrolytes. For mild symptoms, care for a sick infant, child, or family member at home. Sign for emergency room Symptoms of dengue can become severe within a few hours. Severe dengue is a medical emergency.  Severe dengue About 1 in 20 people who get sick  with dengue will develop severe dengue. Severe dengue can result in shock, internal bleeding, and even death. If you have had dengue in the past, you are more likely to develop severe dengue. Infants and pregnant women are at higher risk for developing severe dengue. Symptoms of severe dengue Warning signs of severe dengue  Watch for signs and symptoms of severe dengue. Warning signs usually begin in the 24-48 hours after your fever has gone away.  Immediately go to a local clinic or emergency room if you or a family member has any of the following symptoms.  Belly pain, tenderness Vomiting (at least 3 times in 24 hours) Bleeding from the nose or gums Vomiting blood, or blood in the stool Feeling tired, restless, or irritable  -Red flags and when to present for emergency care or RTC including but not limited to new/worsening/un-resolving symptoms,  reviewed with patient at time of visit. Follow up and care instructions discussed and provided in AVS. - I discussed the assessment and treatment plan with the patient. The patient was provided an opportunity to ask questions and all were answered. The patient agreed with the plan and  demonstrated an understanding of the instructions.  - The patient was advised to call back or seek an in-person evaluation if the symptoms worsen or if the condition fails to improve as anticipated.  I provided 22+ minutes of non-face-to-face time during this encounter.  Delsa Grana, PA-C 07/30/22 4:18 PM

## 2022-08-07 DIAGNOSIS — E782 Mixed hyperlipidemia: Secondary | ICD-10-CM | POA: Insufficient documentation

## 2022-08-07 NOTE — Progress Notes (Signed)
Patient: Natalie Petersen, Female    DOB: 08-25-1976, 46 y.o.   MRN: [id number] Delsa Grana, PA-C Visit Date: 08/07/2022  Today's Provider: Delsa Grana, PA-C   Chief Complaint  Patient presents with   Annual Exam   Subjective:   Annual physical exam:  Natalie Petersen is a 46 y.o. female who presents today for complete physical exam:  Exercise/Activity:  exercise often Diet/nutrition:  healthy - very actively working on healthy diet and lifestyle and monitoring weight Sleep:  no concerns reported   OSA CPAP res med - mild OSA - she has f/up with specialist  Previously on Rx supplement and taking 5000 IU daily OTC supplement Last vitamin D Lab Results  Component Value Date   VD25OH 31 06/15/2020   On B12 supplement as well  Hypertension:  Currently managed on HCTZ 25 mg Pt reports good med compliance and denies any SE.   Blood pressure today is well controlled - much lower readings at home than in office ranges 90-110's/60-70's, no lightheadedness or near syncope, helps her LE edema, when she's taken a half dose she notes holding more fluid in legs  BP Readings from Last 3 Encounters:  08/08/22 130/82  07/30/22 124/89  02/10/22 128/82   Pt denies CP, SOB, exertional sx, LE edema, palpitation, Ha's, visual disturbances, lightheadedness, hypotension, syncope.  Hyperlipidemia: Not on meds, monitoring Last Lipids: Lab Results  Component Value Date   CHOL 217 (H) 07/23/2021   HDL 69 07/23/2021   LDLCALC 118 (H) 07/23/2021   TRIG 178 (H) 07/23/2021   CHOLHDL 3.1 07/23/2021   - Denies: Chest pain, shortness of breath, myalgias, claudication  MDD - phq reviewed elsewhere On wellbutrin 450 mg daily Did use SSRI in the past and it was helpful for moods but at that time she experienced a lot of hair loss so stopped meds, she feels good right now, does not desire/need changes  Hypothyroidism: Subclinical hypothyroid, sx and TSH have improved with starting levothyroxine and  titrating up the dose Current Medication Regimen: 100 mcg synthroid Current Symptoms: denies fatigue, weight changes, heat/cold intolerance, bowel/skin changes or CVS symptoms Most recent results are below; we will be repeating labs today. Lab Results  Component Value Date   TSH 2.00 07/23/2021   T3TOTAL 155 02/11/2018   T4TOTAL 7.8 07/23/2021     SDOH Screenings   Alcohol Screen: Low Risk  (10/03/2020)   Alcohol Screen    Last Alcohol Screening Score (AUDIT): 0  Depression (PHQ2-9): Low Risk  (07/30/2022)   Depression (PHQ2-9)    PHQ-2 Score: 0  Financial Resource Strain: Low Risk  (04/05/2018)   Overall Financial Resource Strain (CARDIA)    Difficulty of Paying Living Expenses: Not hard at all  Food Insecurity: No Food Insecurity (04/05/2018)   Hunger Vital Sign    Worried About Running Out of Food in the Last Year: Never true    Ran Out of Food in the Last Year: Never true  Housing: Not on file  Physical Activity: Insufficiently Active (04/05/2018)   Exercise Vital Sign    Days of Exercise per Week: 3 days    Minutes of Exercise per Session: 20 min  Social Connections: Somewhat Isolated (04/05/2018)   Social Connection and Isolation Panel [NHANES]    Frequency of Communication with Friends and Family: More than three times a week    Frequency of Social Gatherings with Friends and Family: Once a week    Attends Religious Services: Never  Active Member of Clubs or Organizations: No    Attends Archivist Meetings: Never    Marital Status: Married  Stress: Stress Concern Present (10/26/2017)   [place]    Feeling of Stress : To some extent  Tobacco Use: Low Risk  (07/30/2022)   Patient History    Smoking Tobacco Use: Never    Smokeless Tobacco Use: Never    Passive Exposure: Not on file  Transportation Needs: No Transportation Needs (04/05/2018)   PRAPARE - Transportation    Lack of Transportation  (Medical): No    Lack of Transportation (Non-Medical): No    USPSTF grade A and B recommendations - reviewed and addressed today  Depression:  Phq 9 completed today by patient, was reviewed by me with patient in the room PHQ score is neg, pt feels good    07/30/2022    1:53 PM 02/10/2022    9:25 AM 11/28/2021   11:05 AM 10/24/2021    1:10 PM  PHQ 2/9 Scores  PHQ - 2 Score 0 0 0 0  PHQ- 9 Score 0 0 0 0      07/30/2022    1:53 PM 02/10/2022    9:25 AM 11/28/2021   11:05 AM 10/24/2021    1:10 PM 09/26/2021    1:24 PM  Depression screen PHQ 2/9  Decreased Interest 0 0 0 0 0  Down, Depressed, Hopeless 0 0 0 0 0  PHQ - 2 Score 0 0 0 0 0  Altered sleeping 0 0 0 0 0  Tired, decreased energy 0 0 0 0 1  Change in appetite 0 0 0 0 0  Feeling bad or failure about yourself  0 0 0 0 0  Trouble concentrating 0 0 0 0 0  Moving slowly or fidgety/restless 0 0 0 0 0  Suicidal thoughts 0 0 0 0 0  PHQ-9 Score 0 0 0 0 1  Difficult doing work/chores Not difficult at all Not difficult at all Not difficult at all Not difficult at all Not difficult at all    Alcohol screening: [place] Office Visit from 10/03/2020 in [facility]  AUDIT-C Score 0       Immunizations and Health Maintenance: Health Maintenance  Topic Date Due   COVID-19 Vaccine (5 - Pfizer risk series) 11/05/2021   MAMMOGRAM  04/25/2022   INFLUENZA VACCINE  07/22/2022   PAP SMEAR-Modifier  07/25/2023   Fecal DNA (Cologuard)  08/06/2024   TETANUS/TDAP  09/09/2026   Hepatitis C Screening  Completed   HIV Screening  Completed   HPV VACCINES  Aged Out     Hep C Screening: completed  STD testing and prevention (HIV/chl/gon/syphilis):  see above, no additional testing desired by pt today  Intimate partner violence:   safe  Sexual History/Pain during Intercourse: Married  Menstrual History/LMP/Abnormal Bleeding:   No LMP recorded.  Incontinence Symptoms:  none  Breast cancer:  due mammo previously  ordered - at [facility] would be willing to change to [place] in May every year Last Mammogram: *see HM list above BRCA gene screening:   Cervical cancer screening: UTD- Prior cotesting due 2024-2026, no pelvic or GU concerns or sx Pt denies family hx of cancers - breast, ovarian, uterine, colon:     Osteoporosis:    Discussion on osteoporosis per age, including high calcium and vitamin D supplementation, weight bearing exercises Pt is supplementing with daily calcium/Vit D.   Skin cancer:  Hx of skin CA -  NO - dermatology appt oct - [facility] Discussed atypical lesions   Colorectal cancer:   Colonoscopy is UTD   Discussed concerning signs and sx of CRC, pt denies   Lung cancer:   Low Dose CT Chest recommended if Age 77-80 years, 20 pack-year currently smoking OR have quit w/in 15years. Patient does not qualify.    Social History   Tobacco Use   Smoking status: Never   Smokeless tobacco: Never  Vaping Use   Vaping Use: Never used  Substance Use Topics   Alcohol use: Yes    Alcohol/week: 2.0 standard drinks of alcohol    Types: 2 Glasses of wine per week   Drug use: No     Flowsheet Row Office Visit from 10/03/2020 in [facility]  AUDIT-C Score 0       Family History  Problem Relation Age of Onset   Heart murmur Mother    Atrial fibrillation Mother    Arthritis Father        RA   Hyperlipidemia Father    Cancer Maternal Aunt        ovarian and uterine    Arthritis Maternal Grandmother    Heart attack Maternal Grandfather    Stroke Paternal Grandmother    Arthritis Paternal Grandmother    Breast cancer Paternal Grandmother    Stroke Paternal Uncle      Blood pressure/Hypertension: BP Readings from Last 3 Encounters:  07/30/22 124/89  02/10/22 128/82  10/24/21 104/79    Weight/Obesity: Wt Readings from Last 3 Encounters:  02/10/22 260 lb 14.4 oz (118.3 kg)  10/24/21 263 lb (119.3 kg)  09/26/21 263 lb 1.6 oz (119.3 kg)   BMI  Readings from Last 3 Encounters:  02/10/22 37.44 kg/m  10/24/21 37.74 kg/m  09/26/21 37.75 kg/m     Lipids:  Lab Results  Component Value Date   CHOL 217 (H) 07/23/2021   CHOL 200 (H) 07/24/2020   CHOL 199 02/11/2018   Lab Results  Component Value Date   HDL 69 07/23/2021   HDL 67 07/24/2020   HDL 71 02/11/2018   Lab Results  Component Value Date   LDLCALC 118 (H) 07/23/2021   LDLCALC 109 (H) 07/24/2020   LDLCALC 104 (H) 02/11/2018   Lab Results  Component Value Date   TRIG 178 (H) 07/23/2021   TRIG 129 07/24/2020   TRIG 121 02/11/2018   Lab Results  Component Value Date   CHOLHDL 3.1 07/23/2021   CHOLHDL 3.0 07/24/2020   CHOLHDL 2.7 10/13/2017   No results found for: "LDLDIRECT" Based on the results of lipid panel his/her cardiovascular risk factor ( using [place] )  in the next 10 years is: The 10-year ASCVD risk score (Arnett DK, et al., 2019) is: 1%   Values used to calculate the score:     Age: 66 years     Sex: Female     Is Non-Hispanic African American: No     Diabetic: No     Tobacco smoker: No     Systolic Blood Pressure: 902 mmHg     Is BP treated: Yes     HDL Cholesterol: 69 mg/dL     Total Cholesterol: 217 mg/dL  Glucose:  Glucose, Bld  Date Value Ref Range Status  07/23/2021 106 (H) 65 - 99 mg/dL Final    Comment:    .            Fasting reference  interval . For someone without known diabetes, a glucose value between 100 and 125 mg/dL is consistent with prediabetes and should be confirmed with a follow-up test. .   07/24/2020 109 (H) 65 - 99 mg/dL Final    Comment:    .            Fasting reference interval . For someone without known diabetes, a glucose value between 100 and 125 mg/dL is consistent with prediabetes and should be confirmed with a follow-up test. .   06/15/2020 121 (H) 65 - 99 mg/dL Final    Comment:    .            Fasting reference interval . For someone without known diabetes, a glucose  value between 100 and 125 mg/dL is consistent with prediabetes and should be confirmed with a follow-up test. .    Glucose-Capillary  Date Value Ref Range Status  03/15/2018 93 65 - 99 mg/dL Final    Advanced Care Planning:  A voluntary discussion about advance care planning including the explanation and discussion of advance directives.   Discussed health care proxy and Living will, and the patient was able to identify a health care proxy as [place].   Patient does have a living will at present time.   Social History       Social History   Socioeconomic History   Marital status: Married    Spouse name: Maximo Donaire   Number of children: 1   Years of education: Not on file   Highest education level: Master's degree (e.g., MA, MS, MEng, MEd, MSW, MBA)  Occupational History    Comment: full time  Tobacco Use   Smoking status: Never   Smokeless tobacco: Never  Vaping Use   Vaping Use: Never used  Substance and Sexual Activity   Alcohol use: Yes    Alcohol/week: 2.0 standard drinks of alcohol    Types: 2 Glasses of wine per week   Drug use: No   Sexual activity: Yes    Partners: Male    Birth control/protection: Pill  Other Topics Concern   Not on file  Social History Narrative   Not on file   Social Determinants of Health   Financial Resource Strain: Low Risk  (04/05/2018)   Overall Financial Resource Strain (CARDIA)    Difficulty of Paying Living Expenses: Not hard at all  Food Insecurity: No Food Insecurity (04/05/2018)   Hunger Vital Sign    Worried About Running Out of Food in the Last Year: Never true    [place] in the Last Year: Never true  Transportation Needs: No Transportation Needs (04/05/2018)   PRAPARE - Hydrologist (Medical): No    Lack of Transportation (Non-Medical): No  Physical Activity: Insufficiently Active (04/05/2018)   Exercise Vital Sign    Days of Exercise per Week: 3 days    Minutes of Exercise per  Session: 20 min  Stress: Stress Concern Present (10/26/2017)   [place]    Feeling of Stress : To some extent  Social Connections: Somewhat Isolated (04/05/2018)   Social Connection and Isolation Panel [NHANES]    Frequency of Communication with Friends and Family: More than three times a week    Frequency of Social Gatherings with Friends and Family: Once a week    Attends Religious Services: Never    Marine scientist or Organizations: No  Attends Archivist Meetings: Never    Marital Status: Married    Family History        Family History  Problem Relation Age of Onset   Heart murmur Mother    Atrial fibrillation Mother    Arthritis Father        RA   Hyperlipidemia Father    Cancer Maternal Aunt        ovarian and uterine    Arthritis Maternal Grandmother    Heart attack Maternal Grandfather    Stroke Paternal Grandmother    Arthritis Paternal Grandmother    Breast cancer Paternal Grandmother    Stroke Paternal Uncle     Patient Active Problem List   Diagnosis Date Noted   Hypothyroid 07/23/2021   Fibroadenoma of left breast 08/31/2020   OSA (obstructive sleep apnea) 06/18/2020   MDD (major depressive disorder), recurrent, in partial remission ([place]) 06/28/2019   GAD (generalized anxiety disorder) 06/28/2019   LVH (left ventricular hypertrophy) 04/08/2018   Essential hypertension, benign 03/30/2018   Family history of ovarian cancer 10/26/2017   Abnormal mammogram of left breast 09/04/2015   Obesity (BMI 35.0-39.9 without comorbidity) 07/16/2015   Vitamin D deficiency 07/16/2015    Past Surgical History:  Procedure Laterality Date   BRAIN SURGERY       Current Outpatient Medications:    BD PEN NEEDLE NANO 2ND GEN 32G X 4 MM MISC, USE AS DIRECTED WITH SAXENDA DAILY, Disp: 100 each, Rfl: 1   buPROPion (WELLBUTRIN XL) 150 MG 24 hr tablet, Take 1 tablet (150 mg total) by mouth  daily., Disp: 30 tablet, Rfl: 0   buPROPion (WELLBUTRIN XL) 150 MG 24 hr tablet, TAKE 1 TABLET BY MOUTH  DAILY, Disp: 90 tablet, Rfl: 3   buPROPion (WELLBUTRIN XL) 300 MG 24 hr tablet, TAKE 1 TABLET BY MOUTH  DAILY, Disp: 90 tablet, Rfl: 3   cyanocobalamin 1000 MCG tablet, Take 1,000 mcg by mouth daily., Disp: , Rfl:    EPINEPHrine 0.3 mg/0.3 mL IJ SOAJ injection, Inject 0.3 mg into the muscle as needed for anaphylaxis., Disp: 2 each, Rfl: 1   hydrochlorothiazide (HYDRODIURIL) 25 MG tablet, TAKE 1 TABLET BY MOUTH  DAILY, Disp: 90 tablet, Rfl: 0   levothyroxine (SYNTHROID) 100 MCG tablet, TAKE 1 TABLET BY MOUTH  DAILY, Disp: 90 tablet, Rfl: 3   Liraglutide -Weight Management (SAXENDA) 18 MG/3ML SOPN, Inject 3 mg into the skin daily, Disp: 45 mL, Rfl: 3   Vitamin D, Ergocalciferol, 50000 units CAPS, Take by mouth., Disp: , Rfl:   Allergies  Allergen Reactions   Ace Inhibitors Swelling   Lisinopril Swelling   Amoxicillin Hives and Swelling   Benadryl [Diphenhydramine Hcl (Sleep)] Hives and Swelling    Patient Care Team: Delsa Grana, PA-C as PCP - General (Family Medicine) Laqueta Linden, MD as Consulting Physician [facility])   Chart Review: I personally reviewed active problem list, medication list, allergies, family history, social history, health maintenance, notes from last encounter, lab results, imaging with the patient/caregiver today.   Review of Systems        Objective:   Vitals:  There were no vitals filed for this visit.  There is no height or weight on file to calculate BMI.  Physical Exam    Fall Risk:    07/30/2022    1:53 PM 02/10/2022    9:24 AM 11/28/2021   11:05 AM 10/24/2021    1:10 PM 09/26/2021    1:22 PM  Fall Risk  Falls in the past year? 0 0 0 0 0  Number falls in past yr: 0 0 0 0 0  Injury with Fall? 0 0 0 0 0  Risk for fall due to : No Fall Risks No Fall Risks No Fall Risks    Follow up Falls prevention discussed;Education provided  Falls prevention discussed Falls prevention discussed      Functional Status Survey:     Assessment & Plan:    CPE completed today  USPSTF grade A and B recommendations reviewed with patient; age-appropriate recommendations, preventive care, screening tests, etc discussed and encouraged; healthy living encouraged; see AVS for patient education given to patient  Discussed importance of 150 minutes of physical activity weekly, AHA exercise recommendations given to pt in AVS/handout  Discussed importance of healthy diet:  eating lean meats and proteins, avoiding trans fats and saturated fats, avoid simple sugars and excessive carbs in diet, eat 6 servings of fruit/vegetables daily and drink plenty of water and avoid sweet beverages.    Recommended pt to do annual eye exam and routine dental exams/cleanings  Depression, alcohol, fall screening completed as documented above and per flowsheets  Advance Care planning information and packet discussed and offered today, encouraged pt to discuss with family members/spouse/partner/friends and complete Advanced directive packet and bring copy to office   Reviewed Health Maintenance: Health Maintenance  Topic Date Due   COVID-19 Vaccine (5 - Pfizer risk series) 11/05/2021   MAMMOGRAM  04/25/2022   INFLUENZA VACCINE  07/22/2022   PAP SMEAR-Modifier  07/25/2023   Fecal DNA (Cologuard)  08/06/2024   TETANUS/TDAP  09/09/2026   Hepatitis C Screening  Completed   HIV Screening  Completed   HPV VACCINES  Aged Out    Immunizations: Immunization History  Administered Date(s) Administered   Influenza,inj,Quad PF,6+ Mos 08/30/2019   Influenza-Unspecified 10/12/2017, 10/12/2020   PFIZER(Purple Top)SARS-COV-2 Vaccination 02/22/2020, 04/06/2020, 10/18/2020, 09/10/2021   Tdap 09/09/2016   Vaccines:  HPV: up to at age 43 , ask insurance if age between 79-45  Shingrix: 78-64 yo and ask insurance if covered when patient above 63 yo Pneumonia:   educated and discussed with patient. Flu: discussed - [facility]- educated and discussed with patient. COVID:  UTD  Problem List Items Addressed This Visit       Cardiovascular and Mediastinum   Essential hypertension, benign (Chronic)    Well controlled, compliant with meds, no SE or concerning sx  She is having lower BP at home than what we see in office - but not hypotensive or symptomatic Recheck renal function and electrolytes Pt encouraged to continue to work on healthy diet (low salt) and lifestyle for improving HTN management      Relevant Medications   hydrochlorothiazide (HYDRODIURIL) 25 MG tablet   Other Relevant Orders   COMPLETE METABOLIC PANEL WITH GFR     Respiratory   OSA (obstructive sleep apnea)    Well controlled - per specialist        Endocrine   Subclinical hypothyroidism    Feels good/euthyroid, will recheck TSH and adjust meds as needed, tx subclinical hypothyroid seems to have stopped her progressive weight gain, no SE with meds      Relevant Orders   TSH     Other   Obesity (BMI 35.0-39.9 without comorbidity)    Continued diligent efforts and active lifestyle Weight and BMI down from a few years ago She was on weight loss meds - but could not affort so regained  a little weight when stopping meds Wt Readings from Last 5 Encounters:  08/08/22 271 lb 9.6 oz (123.2 kg)  02/10/22 260 lb 14.4 oz (118.3 kg)  10/24/21 263 lb (119.3 kg)  09/26/21 263 lb 1.6 oz (119.3 kg)  08/22/21 272 lb 4.8 oz (123.5 kg)   BMI Readings from Last 5 Encounters:  08/08/22 39.53 kg/m  02/10/22 37.44 kg/m  10/24/21 37.74 kg/m  09/26/21 37.75 kg/m  08/22/21 39.07 kg/m          Vitamin D deficiency    Last labs in normal range, on higher dose OTC supplement, will recheck vit d and ensure calcium is normal - if any abnormalities may need to reduce daily supplement to 1000-2000      Relevant Orders   Vitamin D 1,25 dihydroxy   MDD (major depressive  disorder), recurrent, in partial remission (HCC)    Stable on wellbutrin 450 mg daily phq and gad7 reviewed      Relevant Medications   buPROPion (WELLBUTRIN XL) 300 MG 24 hr tablet   buPROPion (WELLBUTRIN XL) 150 MG 24 hr tablet   GAD (generalized anxiety disorder)    Well controlled - see MDD      Relevant Medications   buPROPion (WELLBUTRIN XL) 300 MG 24 hr tablet   buPROPion (WELLBUTRIN XL) 150 MG 24 hr tablet   Fibroadenoma of left breast   Relevant Orders   MM Digital Diagnostic Bilat   US BREAST COMPLETE UNI LEFT INC AXILLA   Mixed hyperlipidemia    She has continued diet and lifestyle efforts, not on meds, monitoring Currently low ASCVD risk      Relevant Medications   hydrochlorothiazide (HYDRODIURIL) 25 MG tablet   Other Relevant Orders   COMPLETE METABOLIC PANEL WITH GFR   Lipid panel   Pseudoangiomatous stromal hyperplasia of breast   Relevant Orders   MM Digital Diagnostic Bilat   US BREAST COMPLETE UNI LEFT INC AXILLA   Other Visit Diagnoses     Annual physical exam    -  Primary   Relevant Orders   CBC with Differential/Platelet   COMPLETE METABOLIC PANEL WITH GFR   Lipid panel   TSH   Hemoglobin A1c   Vitamin D 1,25 dihydroxy   Need for influenza vaccination       declined here - [facility]   Encounter for screening mammogram for malignant neoplasm of breast       [facility]- we do the orders and she gets done every year in [place]    Relevant Orders   MM Digital Diagnostic Bilat   US BREAST COMPLETE UNI LEFT INC AXILLA   Encounter for screening mammogram for malignant neoplasm of breast       would like to transition screening from [place] back to [employer]    Relevant Orders   MM Digital Diagnostic Bilat   US BREAST COMPLETE UNI LEFT INC AXILLA   Rhinosinusitis       erythematous nasal mucosa and throat - mild sx, will do otc meds, she will avoid abx if worsening        Return in about 6 months (around 02/08/2023) for Routine  follow-up. HTN/BP meds     Delsa Grana, PA-C 08/07/22 1:45 PM  [facility]

## 2022-08-07 NOTE — Patient Instructions (Addendum)
FDA Approved Pharmacotherapy Adjunct to Comprehensive Lifestyle Intervention:  Indication: As an adjunct to a reduced calorie diet and increased physical activity for chronic weight management in adults with an initial BMI >30kg/m2 or ?27kg/m2 in the presence of ?1 weight related comorbid condition (e.g., hypertension, type 2 diabetes, dyslipidemia)   Other FDA approved weight loss meds are qysmia - (Phentermine/topiramate ER), contrave (Naltrexone/bupropion ER - you're already on buproprion), phentermine (stimulant not an option in this form), orlistat over the counter or prescription   Preventive Care 53-37 Years Old, Female Preventive care refers to lifestyle choices and visits with your health care provider that can promote health and wellness. Preventive care visits are also called wellness exams. What can I expect for my preventive care visit? Counseling Your health care provider may ask you questions about your: Medical history, including: Past medical problems. Family medical history. Pregnancy history. Current health, including: Menstrual cycle. Method of birth control. Emotional well-being. Home life and relationship well-being. Sexual activity and sexual health. Lifestyle, including: Alcohol, nicotine or tobacco, and drug use. Access to firearms. Diet, exercise, and sleep habits. Work and work Statistician. Sunscreen use. Safety issues such as seatbelt and bike helmet use. Physical exam Your health care provider will check your: Height and weight. These may be used to calculate your BMI (body mass index). BMI is a measurement that tells if you are at a healthy weight. Waist circumference. This measures the distance around your waistline. This measurement also tells if you are at a healthy weight and may help predict your risk of certain diseases, such as type 2 diabetes and high blood pressure. Heart rate and blood pressure. Body temperature. Skin for abnormal spots. What  immunizations do I need?  Vaccines are usually given at various ages, according to a schedule. Your health care provider will recommend vaccines for you based on your age, medical history, and lifestyle or other factors, such as travel or where you work. What tests do I need? Screening Your health care provider may recommend screening tests for certain conditions. This may include: Lipid and cholesterol levels. Diabetes screening. This is done by checking your blood sugar (glucose) after you have not eaten for a while (fasting). Pelvic exam and Pap test. Hepatitis B test. Hepatitis C test. HIV (human immunodeficiency virus) test. STI (sexually transmitted infection) testing, if you are at risk. Lung cancer screening. Colorectal cancer screening. Mammogram. Talk with your health care provider about when you should start having regular mammograms. This may depend on whether you have a family history of breast cancer. BRCA-related cancer screening. This may be done if you have a family history of breast, ovarian, tubal, or peritoneal cancers. Bone density scan. This is done to screen for osteoporosis. Talk with your health care provider about your test results, treatment options, and if necessary, the need for more tests. Follow these instructions at home: Eating and drinking  Eat a diet that includes fresh fruits and vegetables, whole grains, lean protein, and low-fat dairy products. Take vitamin and mineral supplements as recommended by your health care provider. Do not drink alcohol if: Your health care provider tells you not to drink. You are pregnant, may be pregnant, or are planning to become pregnant. If you drink alcohol: Limit how much you have to 0-1 drink a day. Know how much alcohol is in your drink. In the [place] one drink equals one 12 oz bottle of beer (355 mL), one 5 oz glass of wine (148 mL), or one 1 oz  glass of hard liquor (44 mL). Lifestyle Brush your teeth every  morning and night with fluoride toothpaste. Floss one time each day. Exercise for at least 30 minutes 5 or more days each week. Do not use any products that contain nicotine or tobacco. These products include cigarettes, chewing tobacco, and vaping devices, such as e-cigarettes. If you need help quitting, ask your health care provider. Do not use drugs. If you are sexually active, practice safe sex. Use a condom or other form of protection to prevent STIs. If you do not wish to become pregnant, use a form of birth control. If you plan to become pregnant, see your health care provider for a prepregnancy visit. Take aspirin only as told by your health care provider. Make sure that you understand how much to take and what form to take. Work with your health care provider to find out whether it is safe and beneficial for you to take aspirin daily. Find healthy ways to manage stress, such as: Meditation, yoga, or listening to music. Journaling. Talking to a trusted person. Spending time with friends and family. Minimize exposure to UV radiation to reduce your risk of skin cancer. Safety Always wear your seat belt while driving or riding in a vehicle. Do not drive: If you have been drinking alcohol. Do not ride with someone who has been drinking. When you are tired or distracted. While texting. If you have been using any mind-altering substances or drugs. Wear a helmet and other protective equipment during sports activities. If you have firearms in your house, make sure you follow all gun safety procedures. Seek help if you have been physically or sexually abused. What's next? Visit your health care provider once a year for an annual wellness visit. Ask your health care provider how often you should have your eyes and teeth checked. Stay up to date on all vaccines. This information is not intended to replace advice given to you by your health care provider. Make sure you discuss any questions  you have with your health care provider. Document Revised: 06/05/2021 Document Reviewed: 06/05/2021 Elsevier Patient Education  [place].

## 2022-08-08 ENCOUNTER — Encounter: Payer: Self-pay | Admitting: Family Medicine

## 2022-08-08 ENCOUNTER — Ambulatory Visit (INDEPENDENT_AMBULATORY_CARE_PROVIDER_SITE_OTHER): Payer: 59 | Admitting: Family Medicine

## 2022-08-08 VITALS — BP 130/82 | HR 90 | Temp 98.0°F | Resp 16 | Ht 69.5 in | Wt 271.6 lb

## 2022-08-08 DIAGNOSIS — N6489 Other specified disorders of breast: Secondary | ICD-10-CM

## 2022-08-08 DIAGNOSIS — F411 Generalized anxiety disorder: Secondary | ICD-10-CM

## 2022-08-08 DIAGNOSIS — J329 Chronic sinusitis, unspecified: Secondary | ICD-10-CM

## 2022-08-08 DIAGNOSIS — E669 Obesity, unspecified: Secondary | ICD-10-CM

## 2022-08-08 DIAGNOSIS — Z23 Encounter for immunization: Secondary | ICD-10-CM

## 2022-08-08 DIAGNOSIS — I10 Essential (primary) hypertension: Secondary | ICD-10-CM

## 2022-08-08 DIAGNOSIS — E039 Hypothyroidism, unspecified: Secondary | ICD-10-CM

## 2022-08-08 DIAGNOSIS — Z1231 Encounter for screening mammogram for malignant neoplasm of breast: Secondary | ICD-10-CM | POA: Diagnosis not present

## 2022-08-08 DIAGNOSIS — Z760 Encounter for issue of repeat prescription: Secondary | ICD-10-CM

## 2022-08-08 DIAGNOSIS — E559 Vitamin D deficiency, unspecified: Secondary | ICD-10-CM

## 2022-08-08 DIAGNOSIS — G4733 Obstructive sleep apnea (adult) (pediatric): Secondary | ICD-10-CM | POA: Diagnosis not present

## 2022-08-08 DIAGNOSIS — D242 Benign neoplasm of left breast: Secondary | ICD-10-CM

## 2022-08-08 DIAGNOSIS — E038 Other specified hypothyroidism: Secondary | ICD-10-CM

## 2022-08-08 DIAGNOSIS — F3341 Major depressive disorder, recurrent, in partial remission: Secondary | ICD-10-CM | POA: Diagnosis not present

## 2022-08-08 DIAGNOSIS — E782 Mixed hyperlipidemia: Secondary | ICD-10-CM | POA: Diagnosis not present

## 2022-08-08 DIAGNOSIS — J310 Chronic rhinitis: Secondary | ICD-10-CM

## 2022-08-08 DIAGNOSIS — Z0000 Encounter for general adult medical examination without abnormal findings: Secondary | ICD-10-CM | POA: Diagnosis not present

## 2022-08-08 MED ORDER — HYDROCHLOROTHIAZIDE 25 MG PO TABS: 25.0000 mg | ORAL_TABLET | Freq: Every day | ORAL | 3 refills | Status: DC

## 2022-08-08 MED ORDER — BUPROPION HCL ER (XL) 300 MG PO TB24: ORAL_TABLET | ORAL | 3 refills | Status: DC

## 2022-08-08 MED ORDER — BUPROPION HCL ER (XL) 150 MG PO TB24: ORAL_TABLET | ORAL | 3 refills | Status: DC

## 2022-08-08 NOTE — Assessment & Plan Note (Signed)
Well controlled, compliant with meds, no SE or concerning sx  She is having lower BP at home than what we see in office - but not hypotensive or symptomatic Recheck renal function and electrolytes Pt encouraged to continue to work on healthy diet (low salt) and lifestyle for improving HTN management

## 2022-08-08 NOTE — Assessment & Plan Note (Signed)
Well controlled - per specialist

## 2022-08-08 NOTE — Assessment & Plan Note (Signed)
Stable on wellbutrin 450 mg daily phq and gad7 reviewed

## 2022-08-08 NOTE — Assessment & Plan Note (Signed)
Continued diligent efforts and active lifestyle Weight and BMI down from a few years ago She was on weight loss meds - but could not affort so regained a little weight when stopping meds Wt Readings from Last 5 Encounters:  08/08/22 271 lb 9.6 oz (123.2 kg)  02/10/22 260 lb 14.4 oz (118.3 kg)  10/24/21 263 lb (119.3 kg)  09/26/21 263 lb 1.6 oz (119.3 kg)  08/22/21 272 lb 4.8 oz (123.5 kg)   BMI Readings from Last 5 Encounters:  08/08/22 39.53 kg/m  02/10/22 37.44 kg/m  10/24/21 37.74 kg/m  09/26/21 37.75 kg/m  08/22/21 39.07 kg/m

## 2022-08-08 NOTE — Assessment & Plan Note (Signed)
Well controlled - see MDD

## 2022-08-08 NOTE — Assessment & Plan Note (Signed)
Feels good/euthyroid, will recheck TSH and adjust meds as needed, tx subclinical hypothyroid seems to have stopped her progressive weight gain, no SE with meds

## 2022-08-08 NOTE — Assessment & Plan Note (Signed)
She has continued diet and lifestyle efforts, not on meds, monitoring Currently low ASCVD risk

## 2022-08-08 NOTE — Assessment & Plan Note (Signed)
Last labs in normal range, on higher dose OTC supplement, will recheck vit d and ensure calcium is normal - if any abnormalities may need to reduce daily supplement to 1000-2000

## 2022-08-12 LAB — CBC WITH DIFFERENTIAL/PLATELET
Absolute Monocytes: 523 cells/uL (ref 200–950)
Basophils Absolute: 62 cells/uL (ref 0–200)
Basophils Relative: 0.8 %
Eosinophils Absolute: 70 cells/uL (ref 15–500)
Eosinophils Relative: 0.9 %
HCT: 43.4 % (ref 35.0–45.0)
Hemoglobin: 14.5 g/dL (ref 11.7–15.5)
Lymphs Abs: 1966 cells/uL (ref 850–3900)
MCH: 28.8 pg (ref 27.0–33.0)
MCHC: 33.4 g/dL (ref 32.0–36.0)
MCV: 86.3 fL (ref 80.0–100.0)
MPV: 9.9 fL (ref 7.5–12.5)
Monocytes Relative: 6.7 %
Neutro Abs: 5179 cells/uL (ref 1500–7800)
Neutrophils Relative %: 66.4 %
Platelets: 285 10*3/uL (ref 140–400)
RBC: 5.03 10*6/uL (ref 3.80–5.10)
RDW: 13.5 % (ref 11.0–15.0)
Total Lymphocyte: 25.2 %
WBC: 7.8 10*3/uL (ref 3.8–10.8)

## 2022-08-12 LAB — COMPLETE METABOLIC PANEL WITH GFR
AG Ratio: 1.6 (calc) (ref 1.0–2.5)
ALT: 17 U/L (ref 6–29)
AST: 14 U/L (ref 10–35)
Albumin: 4.1 g/dL (ref 3.6–5.1)
Alkaline phosphatase (APISO): 69 U/L (ref 31–125)
BUN: 13 mg/dL (ref 7–25)
CO2: 27 mmol/L (ref 20–32)
Calcium: 9.5 mg/dL (ref 8.6–10.2)
Chloride: 100 mmol/L (ref 98–110)
Creat: 0.79 mg/dL (ref 0.50–0.99)
Globulin: 2.6 g/dL (calc) (ref 1.9–3.7)
Glucose, Bld: 95 mg/dL (ref 65–99)
Potassium: 4.2 mmol/L (ref 3.5–5.3)
Sodium: 137 mmol/L (ref 135–146)
Total Bilirubin: 0.5 mg/dL (ref 0.2–1.2)
Total Protein: 6.7 g/dL (ref 6.1–8.1)
eGFR: 93 mL/min/{1.73_m2} (ref >=60)

## 2022-08-12 LAB — HEMOGLOBIN A1C
Hgb A1c MFr Bld: 5.3 % of total Hgb (ref <5.7)
Mean Plasma Glucose: 105 mg/dL
eAG (mmol/L): 5.8 mmol/L

## 2022-08-12 LAB — LIPID PANEL
Cholesterol: 207 mg/dL — ABNORMAL HIGH (ref <200)
HDL: 69 mg/dL (ref >=50)
LDL Cholesterol (Calc): 118 mg/dL (calc) — ABNORMAL HIGH
Non-HDL Cholesterol (Calc): 138 mg/dL (calc) — ABNORMAL HIGH (ref <130)
Total CHOL/HDL Ratio: 3 (calc) (ref <5.0)
Triglycerides: 98 mg/dL (ref <150)

## 2022-08-12 LAB — VITAMIN D 1,25 DIHYDROXY
Vitamin D 1, 25 (OH)2 Total: 49 pg/mL (ref 18–72)
Vitamin D2 1, 25 (OH)2: <8 pg/mL
Vitamin D3 1, 25 (OH)2: 49 pg/mL

## 2022-08-12 LAB — TSH: TSH: 1.95 mIU/L

## 2022-08-13 ENCOUNTER — Inpatient Hospital Stay
Admission: RE | Admit: 2022-08-13 | Discharge: 2022-08-13 | Disposition: A | Discharge status: Home / Self Care | Payer: Self-pay | Source: Ambulatory Visit | Attending: *Deleted | Admitting: *Deleted

## 2022-08-13 ENCOUNTER — Other Ambulatory Visit: Payer: Self-pay | Admitting: Family Medicine

## 2022-08-13 ENCOUNTER — Other Ambulatory Visit: Payer: Self-pay | Admitting: *Deleted

## 2022-08-13 DIAGNOSIS — Z1231 Encounter for screening mammogram for malignant neoplasm of breast: Secondary | ICD-10-CM

## 2022-10-07 ENCOUNTER — Encounter: Payer: Self-pay | Admitting: Family Medicine

## 2023-04-14 ENCOUNTER — Other Ambulatory Visit: Payer: Self-pay | Admitting: Unknown Physician Specialty

## 2023-04-14 DIAGNOSIS — E039 Hypothyroidism, unspecified: Secondary | ICD-10-CM

## 2023-04-14 NOTE — Telephone Encounter (Signed)
Requested Prescriptions  Pending Prescriptions Disp Refills   levothyroxine (SYNTHROID) 100 MCG tablet [Pharmacy Med Name: Levothyroxine Sodium 100 MCG Oral Tablet] 90 tablet 1    Sig: TAKE 1 TABLET BY MOUTH DAILY     Endocrinology:  Hypothyroid Agents Passed - 04/14/2023  5:40 AM      Passed - TSH in normal range and within 360 days    TSH  Date Value Ref Range Status  08/08/2022 1.95 mIU/L Final    Comment:              Reference Range .           > or = 20 Years  0.40-4.50 .                Pregnancy Ranges           First trimester    0.26-2.66           Second trimester   0.55-2.73           Third trimester    0.43-2.91          Passed - Valid encounter within last 12 months    Recent Outpatient Visits           8 months ago Annual physical exam   [facility] Danelle Berry, PA-C   8 months ago Viral illness   [facility] Danelle Berry, PA-C   1 year ago Obesity (BMI 35.0-39.9 without comorbidity)   [facility]um, Oswaldo Conroy, PA-C   1 year ago COVID-19   [facility] Berniece Salines, FNP   1 year ago Essential hypertension, benign   [facility] Gabriel Cirri, NP       Future Appointments             In 4 months Danelle Berry, PA-C [facility]

## 2023-04-23 ENCOUNTER — Other Ambulatory Visit: Payer: Self-pay | Admitting: Physician Assistant

## 2023-04-23 DIAGNOSIS — T782XXD Anaphylactic shock, unspecified, subsequent encounter: Secondary | ICD-10-CM

## 2023-04-24 NOTE — Telephone Encounter (Signed)
Requested Prescriptions  Pending Prescriptions Disp Refills   EPINEPHRINE 0.3 mg/0.3 mL IJ SOAJ injection [Pharmacy Med Name: EPINEPHrine AUTO 0.3MG  (Epipen)] 2 each 1    Sig: INJECT INTRAMUSCULARLY 1 PEN AS  NEEDED FOR ALLERGIC RESPONSE AS  DIRECTED BY MD. Assunta Found MEDICAL  HELP AFTER USE.     Immunology: Antidotes Passed - 04/23/2023  2:05 PM      Passed - Valid encounter within last 12 months    Recent Outpatient Visits           8 months ago Annual physical exam   [facility] Danelle Berry, PA-C   8 months ago Viral illness   [facility] Danelle Berry, PA-C   1 year ago Obesity (BMI 35.0-39.9 without comorbidity)   [facility] Mecum, Oswaldo Conroy, PA-C   1 year ago COVID-19   [facility] Berniece Salines, FNP   1 year ago Essential hypertension, benign   [facility] Gabriel Cirri, NP       Future Appointments             In 3 months Danelle Berry, PA-C [facility], [facility]

## 2023-06-17 ENCOUNTER — Other Ambulatory Visit: Payer: Self-pay | Admitting: Family Medicine

## 2023-06-17 DIAGNOSIS — F3341 Major depressive disorder, recurrent, in partial remission: Secondary | ICD-10-CM

## 2023-06-17 DIAGNOSIS — T782XXD Anaphylactic shock, unspecified, subsequent encounter: Secondary | ICD-10-CM

## 2023-07-24 ENCOUNTER — Other Ambulatory Visit: Payer: Self-pay | Admitting: Family Medicine

## 2023-07-24 DIAGNOSIS — F3341 Major depressive disorder, recurrent, in partial remission: Secondary | ICD-10-CM

## 2023-07-27 NOTE — Telephone Encounter (Signed)
Requested Prescriptions  Pending Prescriptions Disp Refills   buPROPion (WELLBUTRIN XL) 300 MG 24 hr tablet [Pharmacy Med Name: buPROPion HCl ER (XL) 300 MG Oral Tablet Extended Release 24 Hour] 90 tablet 0    Sig: TAKE 1 TABLET BY MOUTH IN THE  MORNING WITH 150 MG TABLET FOR  TOTAL OF 450 MG     Psychiatry: Antidepressants - bupropion Failed - 07/24/2023 10:33 PM      Failed - Valid encounter within last 6 months    Recent Outpatient Visits           11 months ago Annual physical exam   [facility] Danelle Berry, PA-C   12 months ago Viral illness   [facility] Danelle Berry, PA-C   1 year ago Obesity (BMI 35.0-39.9 without comorbidity)   [place] [facility] Mecum, Oswaldo Conroy, PA-C   1 year ago COVID-19   [facility] Della Goo F, FNP   1 year ago Essential hypertension, benign   [facility] Gabriel Cirri, NP       Future Appointments             In 2 weeks Danelle Berry, PA-C [facility]            Passed - Cr in normal range and within 360 days    Creat  Date Value Ref Range Status  08/08/2022 0.79 0.50 - 0.99 mg/dL Final         Passed - AST in normal range and within 360 days    AST  Date Value Ref Range Status  08/08/2022 14 10 - 35 U/L Final         Passed - ALT in normal range and within 360 days    ALT  Date Value Ref Range Status  08/08/2022 17 6 - 29 U/L Final         Passed - Completed PHQ-2 or PHQ-9 in the last 360 days      Passed - Last BP in normal range    BP Readings from Last 1 Encounters:  08/08/22 130/82

## 2023-08-13 NOTE — Progress Notes (Signed)
Name: Natalie Petersen   MRN: [id number]    DOB: 09/13/1976   Date:08/14/2023       Progress Note  Subjective  Chief Complaint  Chief Complaint  Patient presents with   Annual Exam    HPI  Patient presents for annual CPE and acute issue  Bradycardia: patient reports that lately she has noticed low heart rate and feeling lethargic.  She says that her heart rate 52-59.  She reports she has been exercising.  Will get EKG,  labs and zio patch.  EKG was NSR.   Diet: weight watcher, protein, fruit, vegetbales Exercise: 5 days 45 minutes  Last Eye Exam: March 2024 Last Dental Exam: February 2024  Flowsheet Row Office Visit from 08/14/2023 in [facility]  AUDIT-C Score 0      Depression: Phq 9 is  negative    08/14/2023   11:17 AM 08/08/2022    9:04 AM 07/30/2022    1:53 PM 02/10/2022    9:25 AM 11/28/2021   11:05 AM  Depression screen PHQ 2/9  Decreased Interest 0  0 0 0  Down, Depressed, Hopeless 0 0 0 0 0  PHQ - 2 Score 0 0 0 0 0  Altered sleeping 0 0 0 0 0  Tired, decreased energy 0 0 0 0 0  Change in appetite 0 0 0 0 0  Feeling bad or failure about yourself  0 0 0 0 0  Trouble concentrating 0 0 0 0 0  Moving slowly or fidgety/restless 0 0 0 0 0  Suicidal thoughts 0 0 0 0 0  PHQ-9 Score 0 0 0 0 0  Difficult doing work/chores Not difficult at all Not difficult at all Not difficult at all Not difficult at all Not difficult at all   Hypertension: BP Readings from Last 3 Encounters:  08/14/23 122/76  08/08/22 130/82  07/30/22 124/89   Obesity: Wt Readings from Last 3 Encounters:  08/14/23 269 lb 6.4 oz (122.2 kg)  08/08/22 271 lb 9.6 oz (123.2 kg)  02/10/22 260 lb 14.4 oz (118.3 kg)   BMI Readings from Last 3 Encounters:  08/14/23 39.21 kg/m  08/08/22 39.53 kg/m  02/10/22 37.44 kg/m     Vaccines:  HPV: up to at age 36 , ask insurance if age between 56-45  Shingrix: 70-64 yo and ask insurance if covered when patient above 7 yo Pneumonia:   educated and discussed with patient. Flu:  educated and discussed with patient.     Hep C Screening: completed STD testing and prevention (HIV/chl/gon/syphilis): completed Intimate partner violence: negative screen  Sexual History : yes, vasectomy Menstrual History/LMP/Abnormal Bleeding: [facility]: 08/14/2023 Discussed importance of follow up if any post-menopausal bleeding: no  Incontinence Symptoms: negative for symptoms   Breast cancer:  - Last Mammogram: 08/13/2022 - BRCA gene screening:   Osteoporosis Prevention : Discussed high calcium and vitamin D supplementation, weight bearing exercises Bone density :no   Cervical cancer screening: 08/06/2021  Skin cancer: Discussed monitoring for atypical lesions  Colorectal cancer:    Lung cancer:  Low Dose CT Chest recommended if Age 88-80 years, 20 pack-year currently smoking OR have quit w/in 15years. Patient does not qualify for screen   ECG: 02/11/2018  Advanced Care Planning: A voluntary discussion about advance care planning including the explanation and discussion of advance directives.  Discussed health care proxy and Living will, and the patient was able to identify a health care proxy as husband.  Patient does have a  living will and power of attorney of health care   Lipids: Lab Results  Component Value Date   CHOL 207 (H) 08/08/2022   CHOL 217 (H) 07/23/2021   CHOL 200 (H) 07/24/2020   Lab Results  Component Value Date   HDL 69 08/08/2022   HDL 69 07/23/2021   HDL 67 07/24/2020   Lab Results  Component Value Date   LDLCALC 118 (H) 08/08/2022   LDLCALC 118 (H) 07/23/2021   LDLCALC 109 (H) 07/24/2020   Lab Results  Component Value Date   TRIG 98 08/08/2022   TRIG 178 (H) 07/23/2021   TRIG 129 07/24/2020   Lab Results  Component Value Date   CHOLHDL 3.0 08/08/2022   CHOLHDL 3.1 07/23/2021   CHOLHDL 3.0 07/24/2020   No results found for: "LDLDIRECT"  Glucose: Glucose, Bld  Date Value Ref Range Status   08/08/2022 95 65 - 99 mg/dL Final    Comment:    .            Fasting reference interval .   07/23/2021 106 (H) 65 - 99 mg/dL Final    Comment:    .            Fasting reference interval . For someone without known diabetes, a glucose value between 100 and 125 mg/dL is consistent with prediabetes and should be confirmed with a follow-up test. .   07/24/2020 109 (H) 65 - 99 mg/dL Final    Comment:    .            Fasting reference interval . For someone without known diabetes, a glucose value between 100 and 125 mg/dL is consistent with prediabetes and should be confirmed with a follow-up test. .    Glucose-Capillary  Date Value Ref Range Status  03/15/2018 93 65 - 99 mg/dL Final    Patient Active Problem List   Diagnosis Date Noted   Pseudoangiomatous stromal hyperplasia of breast 08/08/2022   Mixed hyperlipidemia 08/07/2022   Subclinical hypothyroidism 07/23/2021   Fibroadenoma of left breast 08/31/2020   OSA (obstructive sleep apnea) 06/18/2020   MDD (major depressive disorder), recurrent, in partial remission (HCC) 06/28/2019   GAD (generalized anxiety disorder) 06/28/2019   LVH (left ventricular hypertrophy) 04/08/2018   Essential hypertension, benign 03/30/2018   Family history of ovarian cancer 10/26/2017   Obesity (BMI 35.0-39.9 without comorbidity) 07/16/2015   Vitamin D deficiency 07/16/2015    Past Surgical History:  Procedure Laterality Date   BRAIN SURGERY      Family History  Problem Relation Age of Onset   Heart murmur Mother    Atrial fibrillation Mother    Arthritis Father        RA   Hyperlipidemia Father    Cancer Maternal Aunt        ovarian and uterine    Arthritis Maternal Grandmother    Heart attack Maternal Grandfather    Stroke Paternal Grandmother    Arthritis Paternal Grandmother    Breast cancer Paternal Grandmother    Stroke Paternal Uncle     Social History   Socioeconomic History   Marital status: Married     Spouse name: Maximo Donaire   Number of children: 1   Years of education: Not on file   Highest education level: Master's degree (e.g., MA, MS, MEng, MEd, MSW, MBA)  Occupational History    Comment: full time  Tobacco Use   Smoking status: Never   Smokeless tobacco: Never  Vaping Use  Vaping status: Never Used  Substance and Sexual Activity   Alcohol use: Yes    Alcohol/week: 2.0 standard drinks of alcohol    Types: 2 Glasses of wine per week   Drug use: No   Sexual activity: Yes    Partners: Male    Birth control/protection: Pill  Other Topics Concern   Not on file  Social History Narrative   Not on file   Social Determinants of Health   Financial Resource Strain: Low Risk  (08/14/2023)   Overall Financial Resource Strain (CARDIA)    Difficulty of Paying Living Expenses: Not hard at all  Food Insecurity: No Food Insecurity (08/14/2023)   Hunger Vital Sign    Worried About Running Out of Food in the Last Year: Never true    Ran Out of Food in the Last Year: Never true  Transportation Needs: No Transportation Needs (08/14/2023)   PRAPARE - Administrator, Civil Service (Medical): No    Lack of Transportation (Non-Medical): No  Physical Activity: Sufficiently Active (08/14/2023)   Exercise Vital Sign    Days of Exercise per Week: 5 days    Minutes of Exercise per Session: 40 min  Stress: No Stress Concern Present (08/14/2023)   [employer] of Occupational Health - Occupational Stress Questionnaire    Feeling of Stress : Only a little  Social Connections: Moderately Integrated (08/14/2023)   Social Connection and Isolation Panel [NHANES]    Frequency of Communication with Friends and Family: More than three times a week    Frequency of Social Gatherings with Friends and Family: More than three times a week    Attends Religious Services: More than 4 times per year    Active Member of [employer] or Organizations: No    Attends Banker Meetings: Never     Marital Status: Married  Catering manager Violence: Not At Risk (08/14/2023)   Humiliation, Afraid, Rape, and Kick questionnaire    Fear of Current or Ex-Partner: No    Emotionally Abused: No    Physically Abused: No    Sexually Abused: No     Current Outpatient Medications:    buPROPion (WELLBUTRIN XL) 150 MG 24 hr tablet, Take 150 mg tab with 300 mg tab for total of 450 mg po q am, Disp: 90 tablet, Rfl: 3   buPROPion (WELLBUTRIN XL) 300 MG 24 hr tablet, TAKE 1 TABLET BY MOUTH IN THE  MORNING WITH 150 MG TABLET FOR  TOTAL OF 450 MG, Disp: 90 tablet, Rfl: 0   cyanocobalamin 1000 MCG tablet, Take 1,000 mcg by mouth daily., Disp: , Rfl:    EPINEPHRINE 0.3 mg/0.3 mL IJ SOAJ injection, INJECT INTRAMUSCULARLY 1 PEN AS  NEEDED FOR ALLERGIC RESPONSE AS  DIRECTED BY MD. SEEK MEDICAL  HELP AFTER USE., Disp: 2 each, Rfl: 1   hydrochlorothiazide (HYDRODIURIL) 25 MG tablet, Take 1 tablet (25 mg total) by mouth daily., Disp: 90 tablet, Rfl: 3   levothyroxine (SYNTHROID) 100 MCG tablet, TAKE 1 TABLET BY MOUTH DAILY, Disp: 90 tablet, Rfl: 1   minoxidil (LONITEN) 10 MG tablet, Take 10 mg by mouth daily., Disp: , Rfl:    WEGOVY 1 MG/0.5ML SOAJ, Inject 1 mg into the skin once a week., Disp: , Rfl:   Allergies  Allergen Reactions   Ace Inhibitors Swelling   Lisinopril Swelling   Amoxicillin Hives and Swelling   Benadryl [Diphenhydramine Hcl (Sleep)] Hives and Swelling     ROS  Constitutional: Negative for fever or  weight change.  Respiratory: Negative for cough and shortness of breath.   Cardiovascular: Negative for chest pain or palpitations.  Gastrointestinal: Negative for abdominal pain, no bowel changes.  Musculoskeletal: Negative for gait problem or joint swelling.  Skin: Negative for rash.  Neurological: Negative for dizziness or headache.  No other specific complaints in a complete review of systems (except as listed in HPI above).   Objective  Vitals:   08/14/23 1109  BP: 122/76   Pulse: 76  Resp: 16  Temp: 98.4 F (36.9 C)  TempSrc: Oral  SpO2: 98%  Weight: 269 lb 6.4 oz (122.2 kg)  Height: 5' 9.5" (1.765 m)    Body mass index is 39.21 kg/m.  Physical Exam Constitutional: Patient appears well-developed and well-nourished. No distress.  HENT: Head: Normocephalic and atraumatic. Ears: B TMs ok, no erythema or effusion; Nose: Nose normal. Mouth/Throat: Oropharynx is clear and moist. No oropharyngeal exudate.  Eyes: Conjunctivae and EOM are normal. Pupils are equal, round, and reactive to light. No scleral icterus.  Neck: Normal range of motion. Neck supple. No JVD present. No thyromegaly present.  Cardiovascular: Normal rate, regular rhythm and normal heart sounds.  No murmur heard. No BLE edema. Pulmonary/Chest: Effort normal and breath sounds normal. No respiratory distress. Abdominal: Soft. Bowel sounds are normal, no distension. There is no tenderness. no masses Breast: no lumps or masses, no nipple discharge or rashes Musculoskeletal: Normal range of motion, no joint effusions. No gross deformities Neurological: he is alert and oriented to person, place, and time. No cranial nerve deficit. Coordination, balance, strength, speech and gait are normal.  Skin: Skin is warm and dry. No rash noted. No erythema.  Psychiatric: Patient has a normal mood and affect. behavior is normal. Judgment and thought content normal.   No results found for this or any previous visit (from the past 2160 hour(s)).   Fall Risk:    08/14/2023   11:16 AM 08/08/2022    9:04 AM 07/30/2022    1:53 PM 02/10/2022    9:24 AM 11/28/2021   11:05 AM  Fall Risk   Falls in the past year? 0 0 0 0 0  Number falls in past yr: 0 0 0 0 0  Injury with Fall? 0 0 0 0 0  Risk for fall due to :  No Fall Risks No Fall Risks No Fall Risks No Fall Risks  Follow up  Falls prevention discussed;Education provided Falls prevention discussed;Education provided Falls prevention discussed Falls prevention  discussed     Functional Status Survey: Is the patient deaf or have difficulty hearing?: No Does the patient have difficulty seeing, even when wearing glasses/contacts?: No Does the patient have difficulty concentrating, remembering, or making decisions?: No Does the patient have difficulty walking or climbing stairs?: No Does the patient have difficulty dressing or bathing?: No Does the patient have difficulty doing errands alone such as visiting a doctor's office or shopping?: No   Assessment & Plan  1. Annual physical exam Continue to work on lifestyle modification - CBC with Differential/Platelet - COMPLETE METABOLIC PANEL WITH GFR - Lipid panel - Hemoglobin A1c - TSH - VITAMIN D 25 Hydroxy (Vit-D Deficiency, Fractures) - MM Digital Screening; Future  2. Essential hypertension, benign  - CBC with Differential/Platelet - COMPLETE METABOLIC PANEL WITH GFR  3. Mixed hyperlipidemia  - Lipid panel  4. Vitamin D deficiency  - VITAMIN D 25 Hydroxy (Vit-D Deficiency, Fractures)  5. Subclinical hypothyroidism  - TSH  6. Encounter for screening  mammogram for malignant neoplasm of breast  - MM Digital Screening; Future  7. Screening for diabetes mellitus  - Hemoglobin A1c  8. Other fatigue Getting labs, zio patch,ekg - Estrogens, total - FSH/LH - Testosterone - CBC with Differential/Platelet - COMPLETE METABOLIC PANEL WITH GFR - Lipid panel - Hemoglobin A1c - TSH 9. Patient request for diagnostic testing  - Estrogens, total - FSH/LH - Testosterone  10. Bradycardia -patient reports bradycardia and fatigue will get labs, EKG and zio patch - EKG 12-Lead - LONG TERM MONITOR (3-14 DAYS); Future  - CBC with Differential/Platelet - COMPLETE METABOLIC PANEL WITH GFR - Lipid panel - Hemoglobin A1c - TSH  -USPSTF grade A and B recommendations reviewed with patient; age-appropriate recommendations, preventive care, screening tests, etc discussed and  encouraged; healthy living encouraged; see AVS for patient education given to patient -Discussed importance of 150 minutes of physical activity weekly, eat two servings of fish weekly, eat one serving of tree nuts ( cashews, pistachios, pecans, almonds.Marland Kitchen) every other day, eat 6 servings of fruit/vegetables daily and drink plenty of water and avoid sweet beverages.   -Reviewed Health Maintenance: Yes.

## 2023-08-14 ENCOUNTER — Ambulatory Visit: Payer: 59 | Attending: Nurse Practitioner

## 2023-08-14 ENCOUNTER — Other Ambulatory Visit: Payer: Self-pay

## 2023-08-14 ENCOUNTER — Ambulatory Visit (INDEPENDENT_AMBULATORY_CARE_PROVIDER_SITE_OTHER): Payer: 59 | Admitting: Nurse Practitioner

## 2023-08-14 ENCOUNTER — Encounter: Payer: Self-pay | Admitting: Nurse Practitioner

## 2023-08-14 ENCOUNTER — Encounter: Payer: 59 | Admitting: Family Medicine

## 2023-08-14 VITALS — BP 122/76 | HR 76 | Temp 98.4°F | Resp 16 | Ht 69.5 in | Wt 269.4 lb

## 2023-08-14 DIAGNOSIS — Z0001 Encounter for general adult medical examination with abnormal findings: Secondary | ICD-10-CM | POA: Diagnosis not present

## 2023-08-14 DIAGNOSIS — Z0000 Encounter for general adult medical examination without abnormal findings: Secondary | ICD-10-CM

## 2023-08-14 DIAGNOSIS — I10 Essential (primary) hypertension: Secondary | ICD-10-CM | POA: Diagnosis not present

## 2023-08-14 DIAGNOSIS — R001 Bradycardia, unspecified: Secondary | ICD-10-CM

## 2023-08-14 DIAGNOSIS — E782 Mixed hyperlipidemia: Secondary | ICD-10-CM | POA: Diagnosis not present

## 2023-08-14 DIAGNOSIS — Z131 Encounter for screening for diabetes mellitus: Secondary | ICD-10-CM

## 2023-08-14 DIAGNOSIS — E559 Vitamin D deficiency, unspecified: Secondary | ICD-10-CM | POA: Diagnosis not present

## 2023-08-14 DIAGNOSIS — R5383 Other fatigue: Secondary | ICD-10-CM

## 2023-08-14 DIAGNOSIS — E038 Other specified hypothyroidism: Secondary | ICD-10-CM

## 2023-08-14 DIAGNOSIS — Z1231 Encounter for screening mammogram for malignant neoplasm of breast: Secondary | ICD-10-CM

## 2023-08-14 DIAGNOSIS — Z0189 Encounter for other specified special examinations: Secondary | ICD-10-CM

## 2023-08-15 ENCOUNTER — Encounter: Payer: Self-pay | Admitting: Nurse Practitioner

## 2023-08-17 ENCOUNTER — Other Ambulatory Visit: Payer: Self-pay | Admitting: Nurse Practitioner

## 2023-08-17 DIAGNOSIS — E039 Hypothyroidism, unspecified: Secondary | ICD-10-CM

## 2023-08-17 MED ORDER — LEVOTHYROXINE SODIUM 75 MCG PO TABS: 75.0000 ug | ORAL_TABLET | Freq: Every day | ORAL | 0 refills | Status: DC

## 2023-08-18 NOTE — Telephone Encounter (Signed)
Requested medications are due for refill today.  no  Requested medications are on the active medications list.  yes  Last refill. 08/17/2023 #60 0 rf  Future visit scheduled.   yes  Notes to clinic.  Pharmacy is requesting a 1 year supply.    Requested Prescriptions  Pending Prescriptions Disp Refills   levothyroxine (SYNTHROID) 75 MCG tablet [Pharmacy Med Name: LEVOTHYROXINE 0.075MG  ( ) TABS] 90 tablet     Sig: TAKE 1 TABLET(75 MCG) BY MOUTH DAILY     Endocrinology:  Hypothyroid Agents Passed - 08/17/2023  9:31 AM      Passed - TSH in normal range and within 360 days    TSH  Date Value Ref Range Status  08/14/2023 0.73 mIU/L Final    Comment:              Reference Range .           > or = 20 Years  0.40-4.50 .                Pregnancy Ranges           First trimester    0.26-2.66           Second trimester   0.55-2.73           Third trimester    0.43-2.91          Passed - Valid encounter within last 12 months    Recent Outpatient Visits           4 days ago Annual physical exam   [facility] Berniece Salines, FNP   1 year ago Annual physical exam   [facility] Danelle Berry, PA-C   1 year ago Viral illness   [facility] Danelle Berry, PA-C   1 year ago Obesity (BMI 35.0-39.9 without comorbidity)   [facility] Mecum, Oswaldo Conroy, PA-C   1 year ago COVID-19   [facility] Berniece Salines, FNP       Future Appointments             In 12 months Alba Cory, MD [facility] [place], [facility]

## 2023-08-19 LAB — CBC WITH DIFFERENTIAL/PLATELET
Absolute Monocytes: 428 {cells}/uL (ref 200–950)
Basophils Absolute: 50 {cells}/uL (ref 0–200)
Basophils Relative: 0.8 %
Eosinophils Absolute: 82 {cells}/uL (ref 15–500)
Eosinophils Relative: 1.3 %
HCT: 42.6 % (ref 35.0–45.0)
Hemoglobin: 14 g/dL (ref 11.7–15.5)
Lymphs Abs: 2186 {cells}/uL (ref 850–3900)
MCH: 27.5 pg (ref 27.0–33.0)
MCHC: 32.9 g/dL (ref 32.0–36.0)
MCV: 83.7 fL (ref 80.0–100.0)
MPV: 9.9 fL (ref 7.5–12.5)
Monocytes Relative: 6.8 %
Neutro Abs: 3553 {cells}/uL (ref 1500–7800)
Neutrophils Relative %: 56.4 %
Platelets: 378 10*3/uL (ref 140–400)
RBC: 5.09 10*6/uL (ref 3.80–5.10)
RDW: 13.1 % (ref 11.0–15.0)
Total Lymphocyte: 34.7 %
WBC: 6.3 10*3/uL (ref 3.8–10.8)

## 2023-08-19 LAB — COMPLETE METABOLIC PANEL WITH GFR
AG Ratio: 1.7 (calc) (ref 1.0–2.5)
ALT: 15 U/L (ref 6–29)
AST: 13 U/L (ref 10–35)
Albumin: 4.2 g/dL (ref 3.6–5.1)
Alkaline phosphatase (APISO): 69 U/L (ref 31–125)
BUN: 12 mg/dL (ref 7–25)
CO2: 28 mmol/L (ref 20–32)
Calcium: 9.5 mg/dL (ref 8.6–10.2)
Chloride: 102 mmol/L (ref 98–110)
Creat: 0.83 mg/dL (ref 0.50–0.99)
Globulin: 2.5 g/dL (ref 1.9–3.7)
Glucose, Bld: 81 mg/dL (ref 65–99)
Potassium: 4 mmol/L (ref 3.5–5.3)
Sodium: 139 mmol/L (ref 135–146)
Total Bilirubin: 0.6 mg/dL (ref 0.2–1.2)
Total Protein: 6.7 g/dL (ref 6.1–8.1)
eGFR: 87 mL/min/{1.73_m2} (ref >=60)

## 2023-08-19 LAB — TESTOSTERONE, TOTAL, LC/MS/MS: Testosterone, Total, LC-MS-MS: 17 ng/dL (ref 2–45)

## 2023-08-19 LAB — ESTROGENS, TOTAL: Estrogen: 135 pg/mL

## 2023-08-19 LAB — LIPID PANEL
Cholesterol: 196 mg/dL (ref <200)
HDL: 57 mg/dL (ref >=50)
LDL Cholesterol (Calc): 124 mg/dL — ABNORMAL HIGH
Non-HDL Cholesterol (Calc): 139 mg/dL — ABNORMAL HIGH (ref <130)
Total CHOL/HDL Ratio: 3.4 (calc) (ref <5.0)
Triglycerides: 62 mg/dL (ref <150)

## 2023-08-19 LAB — HEMOGLOBIN A1C
Hgb A1c MFr Bld: 5.4 %{Hb} (ref <5.7)
Mean Plasma Glucose: 108 mg/dL
eAG (mmol/L): 6 mmol/L

## 2023-08-19 LAB — FSH/LH
FSH: 14.6 m[IU]/mL
LH: 2.8 m[IU]/mL

## 2023-08-19 LAB — VITAMIN D 25 HYDROXY (VIT D DEFICIENCY, FRACTURES): Vit D, 25-Hydroxy: 58 ng/mL (ref 30–100)

## 2023-08-19 LAB — TSH: TSH: 0.73 m[IU]/L

## 2023-08-24 DIAGNOSIS — R001 Bradycardia, unspecified: Secondary | ICD-10-CM | POA: Diagnosis not present

## 2023-09-04 ENCOUNTER — Other Ambulatory Visit: Payer: Self-pay | Admitting: Family Medicine

## 2023-09-04 DIAGNOSIS — I10 Essential (primary) hypertension: Secondary | ICD-10-CM

## 2023-09-08 ENCOUNTER — Other Ambulatory Visit: Payer: Self-pay | Admitting: Family Medicine

## 2023-09-08 DIAGNOSIS — F3341 Major depressive disorder, recurrent, in partial remission: Secondary | ICD-10-CM

## 2023-09-08 DIAGNOSIS — T782XXD Anaphylactic shock, unspecified, subsequent encounter: Secondary | ICD-10-CM

## 2023-09-08 MED ORDER — BUPROPION HCL ER (XL) 150 MG PO TB24: ORAL_TABLET | ORAL | 3 refills | Status: DC

## 2023-09-08 MED ORDER — EPINEPHRINE 0.3 MG/0.3ML IJ SOAJ: 0.3000 mg | INTRAMUSCULAR | 1 refills | Status: AC | PRN

## 2023-09-15 ENCOUNTER — Other Ambulatory Visit: Payer: Self-pay | Admitting: Family Medicine

## 2023-09-15 DIAGNOSIS — Z1231 Encounter for screening mammogram for malignant neoplasm of breast: Secondary | ICD-10-CM

## 2023-09-24 ENCOUNTER — Ambulatory Visit
Admission: RE | Admit: 2023-09-24 | Discharge: 2023-09-24 | Disposition: A | Discharge status: Home / Self Care | Payer: 59 | Source: Ambulatory Visit | Attending: Family Medicine | Admitting: Family Medicine

## 2023-09-24 DIAGNOSIS — Z1231 Encounter for screening mammogram for malignant neoplasm of breast: Secondary | ICD-10-CM | POA: Diagnosis present

## 2023-09-28 ENCOUNTER — Encounter: Payer: Self-pay | Admitting: Family Medicine

## 2023-09-29 ENCOUNTER — Other Ambulatory Visit: Payer: Self-pay | Admitting: Family Medicine

## 2023-09-29 DIAGNOSIS — F3341 Major depressive disorder, recurrent, in partial remission: Secondary | ICD-10-CM

## 2023-10-05 ENCOUNTER — Telehealth: Payer: 59 | Admitting: Physician Assistant

## 2023-10-05 DIAGNOSIS — U071 COVID-19: Secondary | ICD-10-CM | POA: Diagnosis not present

## 2023-10-05 MED ORDER — NIRMATRELVIR/RITONAVIR (PAXLOVID)TABLET: 3.0000 | ORAL_TABLET | Freq: Two times a day (BID) | ORAL | 0 refills | Status: AC

## 2023-10-05 MED ORDER — FLUTICASONE PROPIONATE 50 MCG/ACT NA SUSP: 2.0000 | Freq: Every day | NASAL | 0 refills | Status: DC

## 2023-10-05 NOTE — Progress Notes (Signed)
Virtual Visit Consent   Natalie Petersen, you are scheduled for a virtual visit with a [facility] provider today. Just as with appointments in the office, your consent must be obtained to participate. Your consent will be active for this visit and any virtual visit you may have with one of our providers in the next 365 days. If you have a MyChart account, a copy of this consent can be sent to you electronically.  As this is a virtual visit, video technology does not allow for your provider to perform a traditional examination. This may limit your provider's ability to fully assess your condition. If your provider identifies any concerns that need to be evaluated in person or the need to arrange testing (such as labs, EKG, etc.), we will make arrangements to do so. Although advances in technology are sophisticated, we cannot ensure that it will always work on either your end or our end. If the connection with a video visit is poor, the visit may have to be switched to a telephone visit. With either a video or telephone visit, we are not always able to ensure that we have a secure connection.  By engaging in this virtual visit, you consent to the provision of healthcare and authorize for your insurance to be billed (if applicable) for the services provided during this visit. Depending on your insurance coverage, you may receive a charge related to this service.  I need to obtain your verbal consent now. Are you willing to proceed with your visit today? Natalie Petersen has provided verbal consent on 10/05/2023 for a virtual visit (video or telephone). Margaretann Loveless, PA-C  Date: 10/05/2023 9:14 AM  Virtual Visit via Video Note   I, Margaretann Loveless, connected with  Natalie Petersen  ([id number], 04-18-1948) on 10/05/23 at  9:00 AM EDT by a video-enabled telemedicine application and verified that I am speaking with the correct person using two identifiers.  Location: Patient: Virtual Visit Location  Patient: Home Provider: Virtual Visit Location Provider: Home Office   I discussed the limitations of evaluation and management by telemedicine and the availability of in person appointments. The patient expressed understanding and agreed to proceed.    History of Present Illness: Natalie Petersen is a 47 y.o. who identifies as a female who was assigned female at birth, and is being seen today for Covid 12.  HPI: URI  This is a new problem. The current episode started yesterday (Symptoms started on Saturday, 10/03/23; tested positive on at home test yesterday). The problem has been gradually worsening. There has been no fever. Associated symptoms include congestion, coughing (from post nasal drainage), headaches, a plugged ear sensation and sinus pain. Pertinent negatives include no diarrhea, ear pain, nausea, rhinorrhea, sore throat, vomiting or wheezing. Associated symptoms comments: Myalgias, post nasal drainage, loss of taste. Treatments tried: robitussin, nyquil equivalent. The treatment provided no relief.     Problems:  Patient Active Problem List   Diagnosis Date Noted   Pseudoangiomatous stromal hyperplasia of breast 08/08/2022   Mixed hyperlipidemia 08/07/2022   Subclinical hypothyroidism 07/23/2021   Fibroadenoma of left breast 08/31/2020   OSA (obstructive sleep apnea) 06/18/2020   MDD (major depressive disorder), recurrent, in partial remission (HCC) 06/28/2019   GAD (generalized anxiety disorder) 06/28/2019   LVH (left ventricular hypertrophy) 04/08/2018   Essential hypertension, benign 03/30/2018   Family history of ovarian cancer 10/26/2017   Obesity (BMI 35.0-39.9 without comorbidity) 07/16/2015   Vitamin D deficiency 07/16/2015  Allergies:  Allergies  Allergen Reactions   Ace Inhibitors Swelling   Lisinopril Swelling   Amoxicillin Hives and Swelling   Benadryl [Diphenhydramine Hcl (Sleep)] Hives and Swelling   Medications:  Current Outpatient Medications:     fluticasone (FLONASE) 50 MCG/ACT nasal spray, Place 2 sprays into both nostrils daily., Disp: 16 g, Rfl: 0   nirmatrelvir/ritonavir (PAXLOVID) 20 x 150 MG & 10 x 100MG  TABS, Take 3 tablets by mouth 2 (two) times daily for 5 days. (Take nirmatrelvir 150 mg two tablets twice daily for 5 days and ritonavir 100 mg one tablet twice daily for 5 days) Patient GFR is 87, Disp: 30 tablet, Rfl: 0   buPROPion (WELLBUTRIN XL) 150 MG 24 hr tablet, Take 150 mg tab with 300 mg tab for total of 450 mg po q am, Disp: 90 tablet, Rfl: 3   buPROPion (WELLBUTRIN XL) 300 MG 24 hr tablet, TAKE 1 TABLET BY MOUTH IN THE  MORNING WITH 150 MG TABLET FOR  TOTAL OF 450 MG, Disp: 90 tablet, Rfl: 0   cyanocobalamin 1000 MCG tablet, Take 1,000 mcg by mouth daily., Disp: , Rfl:    EPINEPHrine 0.3 mg/0.3 mL IJ SOAJ injection, Inject 0.3 mg into the muscle as needed for anaphylaxis., Disp: 2 each, Rfl: 1   hydrochlorothiazide (HYDRODIURIL) 25 MG tablet, TAKE 1 TABLET BY MOUTH DAILY, Disp: 90 tablet, Rfl: 3   levothyroxine (SYNTHROID) 75 MCG tablet, Take 1 tablet (75 mcg total) by mouth daily., Disp: 60 tablet, Rfl: 0   minoxidil (LONITEN) 10 MG tablet, Take 10 mg by mouth daily., Disp: , Rfl:    WEGOVY 1 MG/0.5ML SOAJ, Inject 1 mg into the skin once a week., Disp: , Rfl:   Observations/Objective: Patient is well-developed, well-nourished in no acute distress.  Resting comfortably at home.  Head is normocephalic, atraumatic.  No labored breathing. Speech is clear and coherent with logical content.  Patient is alert and oriented at baseline.    Assessment and Plan: 1. COVID-19 - nirmatrelvir/ritonavir (PAXLOVID) 20 x 150 MG & 10 x 100MG  TABS; Take 3 tablets by mouth 2 (two) times daily for 5 days. (Take nirmatrelvir 150 mg two tablets twice daily for 5 days and ritonavir 100 mg one tablet twice daily for 5 days) Patient GFR is 87  Dispense: 30 tablet; Refill: 0 - MyChart COVID-19 home monitoring program; Future - fluticasone  (FLONASE) 50 MCG/ACT nasal spray; Place 2 sprays into both nostrils daily.  Dispense: 16 g; Refill: 0  - Continue OTC symptomatic management of choice - Will send OTC vitamins and supplement information through AVS - Paxlovid and Flonase prescribed - Patient enrolled in MyChart symptom monitoring - Push fluids - Rest as needed - Discussed return precautions and when to seek in-person evaluation, sent via AVS as well   Follow Up Instructions: I discussed the assessment and treatment plan with the patient. The patient was provided an opportunity to ask questions and all were answered. The patient agreed with the plan and demonstrated an understanding of the instructions.  A copy of instructions were sent to the patient via MyChart unless otherwise noted below.    The patient was advised to call back or seek an in-person evaluation if the symptoms worsen or if the condition fails to improve as anticipated.    Margaretann Loveless, PA-C

## 2023-10-05 NOTE — Patient Instructions (Signed)
Fionna D Malinak, thank you for joining Margaretann Loveless, PA-C for today's virtual visit.  While this provider is not your primary care provider (PCP), if your PCP is located in our provider database this encounter information will be shared with them immediately following your visit.   A [facility] MyChart account gives you access to today's visit and all your visits, tests, and labs performed at [facility] " click here if you don't have a [facility] MyChart account or go to [email or web address]  Consent: (Patient) Natalie Petersen provided verbal consent for this virtual visit at the beginning of the encounter.  Current Medications:  Current Outpatient Medications:    fluticasone (FLONASE) 50 MCG/ACT nasal spray, Place 2 sprays into both nostrils daily., Disp: 16 g, Rfl: 0   nirmatrelvir/ritonavir (PAXLOVID) 20 x 150 MG & 10 x 100MG  TABS, Take 3 tablets by mouth 2 (two) times daily for 5 days. (Take nirmatrelvir 150 mg two tablets twice daily for 5 days and ritonavir 100 mg one tablet twice daily for 5 days) Patient GFR is 87, Disp: 30 tablet, Rfl: 0   buPROPion (WELLBUTRIN XL) 150 MG 24 hr tablet, Take 150 mg tab with 300 mg tab for total of 450 mg po q am, Disp: 90 tablet, Rfl: 3   buPROPion (WELLBUTRIN XL) 300 MG 24 hr tablet, TAKE 1 TABLET BY MOUTH IN THE  MORNING WITH 150 MG TABLET FOR  TOTAL OF 450 MG, Disp: 90 tablet, Rfl: 0   cyanocobalamin 1000 MCG tablet, Take 1,000 mcg by mouth daily., Disp: , Rfl:    EPINEPHrine 0.3 mg/0.3 mL IJ SOAJ injection, Inject 0.3 mg into the muscle as needed for anaphylaxis., Disp: 2 each, Rfl: 1   hydrochlorothiazide (HYDRODIURIL) 25 MG tablet, TAKE 1 TABLET BY MOUTH DAILY, Disp: 90 tablet, Rfl: 3   levothyroxine (SYNTHROID) 75 MCG tablet, Take 1 tablet (75 mcg total) by mouth daily., Disp: 60 tablet, Rfl: 0   minoxidil (LONITEN) 10 MG tablet, Take 10 mg by mouth daily., Disp: , Rfl:    WEGOVY 1 MG/0.5ML SOAJ, Inject 1 mg into the skin once  a week., Disp: , Rfl:    Medications ordered in this encounter:  Meds ordered this encounter  Medications   nirmatrelvir/ritonavir (PAXLOVID) 20 x 150 MG & 10 x 100MG  TABS    Sig: Take 3 tablets by mouth 2 (two) times daily for 5 days. (Take nirmatrelvir 150 mg two tablets twice daily for 5 days and ritonavir 100 mg one tablet twice daily for 5 days) Patient GFR is 87    Dispense:  30 tablet    Refill:  0    Order Specific Question:   Supervising Provider    Answer:   Merrilee Jansky [[id number]]   fluticasone (FLONASE) 50 MCG/ACT nasal spray    Sig: Place 2 sprays into both nostrils daily.    Dispense:  16 g    Refill:  0    Order Specific Question:   Supervising Provider    Answer:   Merrilee Jansky [id number]     *If you need refills on other medications prior to your next appointment, please contact your pharmacy*  Follow-Up: Call back or seek an in-person evaluation if the symptoms worsen or if the condition fails to improve as anticipated.  [facility] [phone]  Care Instructions: Can take to lessen severity: Vit C 500mg  twice daily Quercertin 250-500mg  twice daily Zinc 75-100mg  daily Melatonin 3-6 mg at bedtime Vit  D3 1000-2000 IU daily Aspirin 81 mg daily with food Optional: Famotidine 20mg  daily Also can add tylenol/ibuprofen as needed for fevers and body aches May add Mucinex or Mucinex DM as needed for cough/congestion    Isolation Instructions: You are to isolate at home until you have been fever free for at least 24 hours without a fever-reducing medication, and symptoms have been steadily improving for 24 hours. At that time,  you can end isolation but need to mask for an additional 5 days.   If you must be around other household members who do not have symptoms, you need to make sure that both you and the family members are masking consistently with a high-quality mask.  If you note any worsening of symptoms despite treatment, please seek  an in-person evaluation ASAP. If you note any significant shortness of breath or any chest pain, please seek ER evaluation. Please do not delay care!   COVID-19: What to Do if You Are Sick If you test positive and are an older adult or someone who is at high risk of getting very sick from COVID-19, treatment may be available. Contact a healthcare provider right away after a positive test to determine if you are eligible, even if your symptoms are mild right now. You can also visit a Test to Treat location and, if eligible, receive a prescription from a provider. Don't delay: Treatment must be started within the first few days to be effective. If you have a fever, cough, or other symptoms, you might have COVID-19. Most people have mild illness and are able to recover at home. If you are sick: Keep track of your symptoms. If you have an emergency warning sign (including trouble breathing), call 911. Steps to help prevent the spread of COVID-19 if you are sick If you are sick with COVID-19 or think you might have COVID-19, follow the steps below to care for yourself and to help protect other people in your home and community. Stay home except to get medical care Stay home. Most people with COVID-19 have mild illness and can recover at home without medical care. Do not leave your home, except to get medical care. Do not visit public areas and do not go to places where you are unable to wear a mask. Take care of yourself. Get rest and stay hydrated. Take over-the-counter medicines, such as acetaminophen, to help you feel better. Stay in touch with your doctor. Call before you get medical care. Be sure to get care if you have trouble breathing, or have any other emergency warning signs, or if you think it is an emergency. Avoid public transportation, ride-sharing, or taxis if possible. Get tested If you have symptoms of COVID-19, get tested. While waiting for test results, stay away from others, including  staying apart from those living in your household. Get tested as soon as possible after your symptoms start. Treatments may be available for people with COVID-19 who are at risk for becoming very sick. Don't delay: Treatment must be started early to be effective--some treatments must begin within 5 days of your first symptoms. Contact your healthcare provider right away if your test result is positive to determine if you are eligible. Self-tests are one of several options for testing for the virus that causes COVID-19 and may be more convenient than laboratory-based tests and point-of-care tests. Ask your healthcare provider or your local health department if you need help interpreting your test results. You can visit your state, tribal, local,  and territorial health department's website to look for the latest local information on testing sites. Separate yourself from other people As much as possible, stay in a specific room and away from other people and pets in your home. If possible, you should use a separate bathroom. If you need to be around other people or animals in or outside of the home, wear a well-fitting mask. Tell your close contacts that they may have been exposed to COVID-19. An infected person can spread COVID-19 starting 48 hours (or 2 days) before the person has any symptoms or tests positive. By letting your close contacts know they may have been exposed to COVID-19, you are helping to protect everyone. See COVID-19 and Animals if you have questions about pets. If you are diagnosed with COVID-19, someone from the health department may call you. Answer the call to slow the spread. Monitor your symptoms Symptoms of COVID-19 include fever, cough, or other symptoms. Follow care instructions from your healthcare provider and local health department. Your local health authorities may give instructions on checking your symptoms and reporting information. When to seek emergency medical  attention Look for emergency warning signs* for COVID-19. If someone is showing any of these signs, seek emergency medical care immediately: Trouble breathing Persistent pain or pressure in the chest New confusion Inability to wake or stay awake Pale, gray, or blue-colored skin, lips, or nail beds, depending on skin tone *This list is not all possible symptoms. Please call your medical provider for any other symptoms that are severe or concerning to you. Call 911 or call ahead to your local emergency facility: Notify the operator that you are seeking care for someone who has or may have COVID-19. Call ahead before visiting your doctor Call ahead. Many medical visits for routine care are being postponed or done by phone or telemedicine. If you have a medical appointment that cannot be postponed, call your doctor's office, and tell them you have or may have COVID-19. This will help the office protect themselves and other patients. If you are sick, wear a well-fitting mask You should wear a mask if you must be around other people or animals, including pets (even at home). Wear a mask with the best fit, protection, and comfort for you. You don't need to wear the mask if you are alone. If you can't put on a mask (because of trouble breathing, for example), cover your coughs and sneezes in some other way. Try to stay at least 6 feet away from other people. This will help protect the people around you. Masks should not be placed on young children under age 30 years, anyone who has trouble breathing, or anyone who is not able to remove the mask without help. Cover your coughs and sneezes Cover your mouth and nose with a tissue when you cough or sneeze. Throw away used tissues in a lined trash can. Immediately wash your hands with soap and water for at least 20 seconds. If soap and water are not available, clean your hands with an alcohol-based hand sanitizer that contains at least 60% alcohol. Clean your  hands often Wash your hands often with soap and water for at least 20 seconds. This is especially important after blowing your nose, coughing, or sneezing; going to the bathroom; and before eating or preparing food. Use hand sanitizer if soap and water are not available. Use an alcohol-based hand sanitizer with at least 60% alcohol, covering all surfaces of your hands and rubbing them together until  they feel dry. Soap and water are the best option, especially if hands are visibly dirty. Avoid touching your eyes, nose, and mouth with unwashed hands. Handwashing Tips Avoid sharing personal household items Do not share dishes, drinking glasses, cups, eating utensils, towels, or bedding with other people in your home. Wash these items thoroughly after using them with soap and water or put in the dishwasher. Clean surfaces in your home regularly Clean and disinfect high-touch surfaces (for example, doorknobs, tables, handles, light switches, and countertops) in your "sick room" and bathroom. In shared spaces, you should clean and disinfect surfaces and items after each use by the person who is ill. If you are sick and cannot clean, a caregiver or other person should only clean and disinfect the area around you (such as your bedroom and bathroom) on an as needed basis. Your caregiver/other person should wait as long as possible (at least several hours) and wear a mask before entering, cleaning, and disinfecting shared spaces that you use. Clean and disinfect areas that may have blood, stool, or body fluids on them. Use household cleaners and disinfectants. Clean visible dirty surfaces with household cleaners containing soap or detergent. Then, use a household disinfectant. Use a product from [employer] List N: Disinfectants for Coronavirus (COVID-19). Be sure to follow the instructions on the label to ensure safe and effective use of the product. Many products recommend keeping the surface wet with a  disinfectant for a certain period of time (look at "contact time" on the product label). You may also need to wear personal protective equipment, such as gloves, depending on the directions on the product label. Immediately after disinfecting, wash your hands with soap and water for 20 seconds. For completed guidance on cleaning and disinfecting your home, visit Complete Disinfection Guidance. Take steps to improve ventilation at home Improve ventilation (air flow) at home to help prevent from spreading COVID-19 to other people in your household. Clear out COVID-19 virus particles in the air by opening windows, using air filters, and turning on fans in your home. Use this interactive tool to learn how to improve air flow in your home. When you can be around others after being sick with COVID-19 Deciding when you can be around others is different for different situations. Find out when you can safely end home isolation. For any additional questions about your care, contact your healthcare provider or state or local health department. 03/12/2021 Content source: [facility] ([facility]), Division of Viral Diseases This information is not intended to replace advice given to you by your health care provider. Make sure you discuss any questions you have with your health care provider. Document Revised: 04/25/2021 Document Reviewed: 04/25/2021 Elsevier Patient Education  2022 [employer].     If you have been instructed to have an in-person evaluation today at a local Urgent Care facility, please use the link below. It will take you to a list of all of our available [facility], including address, phone number and hours of operation. Please do not delay care.  [facility]  If you or a family member do not have a primary care provider, use the link below to schedule a visit and establish care. When you choose a [facility] primary  care physician or advanced practice provider, you gain a long-term partner in health. Find a Primary Care Provider  Learn more about [facility]'s in-office and virtual care options: [facility] - Get Care  Now

## 2023-10-24 ENCOUNTER — Other Ambulatory Visit: Payer: Self-pay | Admitting: Nurse Practitioner

## 2023-10-24 ENCOUNTER — Other Ambulatory Visit: Payer: Self-pay | Admitting: Internal Medicine

## 2023-10-24 DIAGNOSIS — F3341 Major depressive disorder, recurrent, in partial remission: Secondary | ICD-10-CM

## 2023-10-24 DIAGNOSIS — E039 Hypothyroidism, unspecified: Secondary | ICD-10-CM

## 2023-10-26 ENCOUNTER — Other Ambulatory Visit: Payer: Self-pay

## 2023-10-26 ENCOUNTER — Other Ambulatory Visit (HOSPITAL_COMMUNITY): Payer: Self-pay

## 2023-10-26 MED ORDER — LEVOTHYROXINE SODIUM 75 MCG PO TABS
75.0000 ug | ORAL_TABLET | Freq: Every day | ORAL | 1 refills | Status: DC
  Filled 2023-10-26: qty 30, 30d supply, fill #0

## 2023-10-26 NOTE — Telephone Encounter (Signed)
Reordered 09/30/23 #90  Requested Prescriptions  Refused Prescriptions Disp Refills   buPROPion (WELLBUTRIN XL) 300 MG 24 hr tablet [Pharmacy Med Name: buPROPion HCl ER (XL) 300 MG Oral Tablet Extended Release 24 Hour] 90 tablet 3    Sig: TAKE 1 TABLET BY MOUTH IN THE  MORNING WITH 150 MG TABLET FOR  TOTAL OF 450 MG     Psychiatry: Antidepressants - bupropion Passed - 10/24/2023  2:31 PM      Passed - Cr in normal range and within 360 days    Creat  Date Value Ref Range Status  08/14/2023 0.83 0.50 - 0.99 mg/dL Final         Passed - AST in normal range and within 360 days    AST  Date Value Ref Range Status  08/14/2023 13 10 - 35 U/L Final         Passed - ALT in normal range and within 360 days    ALT  Date Value Ref Range Status  08/14/2023 15 6 - 29 U/L Final         Passed - Completed PHQ-2 or PHQ-9 in the last 360 days      Passed - Last BP in normal range    BP Readings from Last 1 Encounters:  08/14/23 122/76         Passed - Valid encounter within last 6 months    Recent Outpatient Visits           2 months ago Annual physical exam   [facility] Berniece Salines, FNP   1 year ago Annual physical exam   [facility] Danelle Berry, PA-C   1 year ago Viral illness   [facility] Danelle Berry, PA-C   1 year ago Obesity (BMI 35.0-39.9 without comorbidity)   [facility], Oswaldo Conroy, PA-C   1 year ago COVID-19   [facility] Berniece Salines, FNP       Future Appointments             In 9 months Alba Cory, MD [facility]

## 2023-10-27 ENCOUNTER — Other Ambulatory Visit: Payer: Self-pay

## 2023-10-30 ENCOUNTER — Other Ambulatory Visit: Payer: Self-pay

## 2023-11-12 ENCOUNTER — Encounter: Payer: Self-pay | Admitting: Nurse Practitioner

## 2023-11-17 ENCOUNTER — Other Ambulatory Visit: Payer: Self-pay

## 2023-11-17 ENCOUNTER — Encounter: Payer: Self-pay | Admitting: Nurse Practitioner

## 2023-11-17 ENCOUNTER — Ambulatory Visit: Payer: 59 | Admitting: Nurse Practitioner

## 2023-11-17 VITALS — BP 118/84 | HR 95 | Temp 98.1°F | Resp 16 | Ht 69.5 in | Wt 248.2 lb

## 2023-11-17 DIAGNOSIS — E039 Hypothyroidism, unspecified: Secondary | ICD-10-CM | POA: Diagnosis not present

## 2023-11-17 DIAGNOSIS — E669 Obesity, unspecified: Secondary | ICD-10-CM | POA: Diagnosis not present

## 2023-11-17 NOTE — Progress Notes (Signed)
BP 118/84   Pulse 95   Temp 98.1 F (36.7 C) (Oral)   Resp 16   Ht 5' 9.5" (1.765 m)   Wt 248 lb 3.2 oz (112.6 kg)   SpO2 99%   BMI 36.13 kg/m    Subjective:    Patient ID: Natalie Petersen, female    DOB: Jun 12, 1976, 47 y.o.   MRN: [id number]  HPI: Natalie Petersen is a 47 y.o. female  Chief Complaint  Patient presents with   Obesity   Discussed the use of AI scribe software for clinical note transcription with the patient, who gave verbal consent to proceed.  History of Present Illness   The patient, with a history of obesity and hypothyroidism, presents for a follow-up visit. She has been participating in a [employer] program and taking Wegovy for weight loss. She experienced severe side effects when the dose was increased to 1.0mg , including sulfur burps and nausea. The side effects subsided after a few weeks, but she is concerned about increasing the dose to 1.7mg  as planned. She has been on the 1.0mg  dose for several months and feels it is not as effective as it was initially. She has a 1.7mg  dose at home and plans to start it after Thanksgiving.  In addition, she has been taking half of a levothyroxine tablet for her hypothyroidism for a couple of weeks. She had been prescribed , but there was an issue with the pharmacy and she decided to use her 90-day supply of tablets, splitting them in half. She reports feeling fine on the dose.  Lastly, she had COVID-19 last month and is considering getting the flu vaccine this year.    Obesity Co morbidities include hypothyroidism, HTN, OSA, HLD    11/17/2023    7:36 AM 08/14/2023   11:09 AM 08/08/2022    9:06 AM  Vitals with BMI  Height 5' 9.5" 5' 9.5" 5' 9.5"  Weight 248 lbs 3 oz 269 lbs 6 oz 271 lbs 10 oz  BMI 36.14 39.23 39.55  Systolic 118 122 202  Diastolic 84 76 82  Pulse 95 76 90    Relevant past medical, surgical, family and social history reviewed and updated as indicated. Interim medical  history since our last visit reviewed. Allergies and medications reviewed and updated.  Review of Systems  Constitutional: Negative for fever or weight change.  Respiratory: Negative for cough and shortness of breath.   Cardiovascular: Negative for chest pain or palpitations.  Gastrointestinal: Negative for abdominal pain, no bowel changes.  Musculoskeletal: Negative for gait problem or joint swelling.  Skin: Negative for rash.  Neurological: Negative for dizziness or headache.  No other specific complaints in a complete review of systems (except as listed in HPI above).      Objective:    BP 118/84   Pulse 95   Temp 98.1 F (36.7 C) (Oral)   Resp 16   Ht 5' 9.5" (1.765 m)   Wt 248 lb 3.2 oz (112.6 kg)   SpO2 99%   BMI 36.13 kg/m   Wt Readings from Last 3 Encounters:  11/17/23 248 lb 3.2 oz (112.6 kg)  08/14/23 269 lb 6.4 oz (122.2 kg)  08/08/22 271 lb 9.6 oz (123.2 kg)    Physical Exam  Constitutional: Patient appears well-developed and well-nourished. Obese  No distress.  HEENT: head atraumatic, normocephalic, pupils equal and reactive to light, neck supple Cardiovascular: Normal rate, regular rhythm and normal heart sounds.  No  murmur heard. No BLE edema. Pulmonary/Chest: Effort normal and breath sounds normal. No respiratory distress. Abdominal: Soft.  There is no tenderness. Psychiatric: Patient has a normal mood and affect. behavior is normal. Judgment and thought content normal.   Results for orders placed or performed in visit on 08/14/23  CBC with Differential/Platelet  Result Value Ref Range   WBC 6.3 3.8 - 10.8 Thousand/uL   RBC 5.09 3.80 - 5.10 Million/uL   Hemoglobin 14.0 11.7 - 15.5 g/dL   HCT 78.2 95.6 - 21.3 %   MCV 83.7 80.0 - 100.0 fL   MCH 27.5 27.0 - 33.0 pg   MCHC 32.9 32.0 - 36.0 g/dL   RDW 08.6 57.8 - 46.9 %   Platelets 378 140 - 400 Thousand/uL   MPV 9.9 7.5 - 12.5 fL   Neutro Abs 3,553 1,500 - 7,800 cells/uL   Lymphs Abs 2,186 850 -  3,900 cells/uL   Absolute Monocytes 428 200 - 950 cells/uL   Eosinophils Absolute 82 15 - 500 cells/uL   Basophils Absolute 50 0 - 200 cells/uL   Neutrophils Relative % 56.4 %   Total Lymphocyte 34.7 %   Monocytes Relative 6.8 %   Eosinophils Relative 1.3 %   Basophils Relative 0.8 %  COMPLETE METABOLIC PANEL WITH GFR  Result Value Ref Range   Glucose, Bld 81 65 - 99 mg/dL   BUN 12 7 - 25 mg/dL   Creat 6.29 5.28 - 4.13 mg/dL   eGFR 87 > OR = 60 KG/MWN/0.27O5   BUN/Creatinine Ratio SEE NOTE: 6 - 22 (calc)   Sodium 139 135 - 146 mmol/L   Potassium 4.0 3.5 - 5.3 mmol/L   [place] 102 98 - 110 mmol/L   CO2 28 20 - 32 mmol/L   Calcium 9.5 8.6 - 10.2 mg/dL   Total Protein 6.7 6.1 - 8.1 g/dL   Albumin 4.2 3.6 - 5.1 g/dL   Globulin 2.5 1.9 - 3.7 g/dL (calc)   AG Ratio 1.7 1.0 - 2.5 (calc)   Total Bilirubin 0.6 0.2 - 1.2 mg/dL   Alkaline phosphatase (APISO) 69 31 - 125 U/L   AST 13 10 - 35 U/L   ALT 15 6 - 29 U/L  Lipid panel  Result Value Ref Range   Cholesterol 196 <200 mg/dL   HDL 57 > OR = 50 mg/dL   Triglycerides 62 <366 mg/dL   LDL Cholesterol (Calc) 124 (H) mg/dL (calc)   Total CHOL/HDL Ratio 3.4 <5.0 (calc)   Non-HDL Cholesterol (Calc) 139 (H) <130 mg/dL (calc)  Hemoglobin Y4I  Result Value Ref Range   Hgb A1c MFr Bld 5.4 <5.7 % of total Hgb   Mean Plasma Glucose 108 mg/dL   eAG (mmol/L) 6.0 mmol/L  TSH  Result Value Ref Range   TSH 0.73 mIU/L  VITAMIN D 25 Hydroxy (Vit-D Deficiency, Fractures)  Result Value Ref Range   Vit D, 25-Hydroxy 58 30 - 100 ng/mL  Estrogens, total  Result Value Ref Range   Estrogen 135 pg/mL  FSH/LH  Result Value Ref Range   FSH 14.6 mIU/mL   LH 2.8 mIU/mL  Testosterone, Total, LC/MS/MS  Result Value Ref Range   Testosterone, Total, LC-MS-MS 17 2 - 45 ng/dL      Assessment & Plan:   Problem List Items Addressed This Visit       Other   Obesity (BMI 35.0-39.9 without comorbidity) - Primary    Assessment and Plan     Obesity Currently on Wegovy  1mg  with some side effects. Patient reports less efficacy after several months on this dose. Plan to increase to 1.7mg , but patient is concerned about potential side effects. -Increase Wegovy to 1.7mg  starting on 11/20/2023. -Contact provider after third dose to assess tolerance and efficacy.  Hypothyroidism Patient currently taking of Levothyroxine (half of tablet) for the past couple of weeks. No current symptoms of hypothyroidism. -Continue Levothyroxine daily. -Check TSH level before next refill.  COVID-19 Vaccination Patient had COVID-19 last month and is due for additional vaccination in December. -Plan to receive COVID-19 booster in December 2024.  Influenza Vaccination Patient is undecided about receiving the flu shot this year. -Encouraged to receive the flu shot for this season.        Follow up plan: Return in about 3 months (around 02/17/2024) for follow up.

## 2023-12-06 ENCOUNTER — Other Ambulatory Visit: Payer: Self-pay | Admitting: Family Medicine

## 2023-12-06 DIAGNOSIS — F3341 Major depressive disorder, recurrent, in partial remission: Secondary | ICD-10-CM

## 2023-12-07 ENCOUNTER — Other Ambulatory Visit: Payer: Self-pay | Admitting: Nurse Practitioner

## 2023-12-07 ENCOUNTER — Other Ambulatory Visit: Payer: Self-pay

## 2023-12-07 DIAGNOSIS — E669 Obesity, unspecified: Secondary | ICD-10-CM

## 2023-12-07 DIAGNOSIS — F3341 Major depressive disorder, recurrent, in partial remission: Secondary | ICD-10-CM

## 2023-12-07 MED ORDER — BUPROPION HCL ER (XL) 300 MG PO TB24: ORAL_TABLET | ORAL | 3 refills | Status: DC

## 2023-12-07 MED ORDER — WEGOVY 1.7 MG/0.75ML ~~LOC~~ SOAJ: 1.7000 mg | SUBCUTANEOUS | 0 refills | Status: DC

## 2023-12-08 ENCOUNTER — Other Ambulatory Visit: Payer: Self-pay | Admitting: Nurse Practitioner

## 2023-12-08 ENCOUNTER — Other Ambulatory Visit: Payer: Self-pay | Admitting: Internal Medicine

## 2023-12-08 DIAGNOSIS — E669 Obesity, unspecified: Secondary | ICD-10-CM

## 2023-12-08 DIAGNOSIS — F3341 Major depressive disorder, recurrent, in partial remission: Secondary | ICD-10-CM

## 2023-12-08 MED ORDER — WEGOVY 1.7 MG/0.75ML ~~LOC~~ SOAJ: 1.7000 mg | SUBCUTANEOUS | 0 refills | Status: DC

## 2023-12-08 NOTE — Telephone Encounter (Signed)
Requested Prescriptions  Refused Prescriptions Disp Refills   buPROPion (WELLBUTRIN XL) 300 MG 24 hr tablet [Pharmacy Med Name: buPROPion HCl ER (XL) 300 MG Oral Tablet Extended Release 24 Hour] 90 tablet 3    Sig: TAKE 1 TABLET BY MOUTH IN THE  MORNING WITH 150 MG TABLET FOR  TOTAL OF 450 MG     Psychiatry: Antidepressants - bupropion Passed - 12/08/2023  2:16 PM      Passed - Cr in normal range and within 360 days    Creat  Date Value Ref Range Status  08/14/2023 0.83 0.50 - 0.99 mg/dL Final         Passed - AST in normal range and within 360 days    AST  Date Value Ref Range Status  08/14/2023 13 10 - 35 U/L Final         Passed - ALT in normal range and within 360 days    ALT  Date Value Ref Range Status  08/14/2023 15 6 - 29 U/L Final         Passed - Completed PHQ-2 or PHQ-9 in the last 360 days      Passed - Last BP in normal range    BP Readings from Last 1 Encounters:  11/17/23 118/84         Passed - Valid encounter within last 6 months    Recent Outpatient Visits           3 weeks ago Obesity (BMI 35.0-39.9 without comorbidity)   [facility] Berniece Salines, FNP   3 months ago Annual physical exam   [facility] Berniece Salines, FNP   1 year ago Annual physical exam   [facility] Danelle Berry, PA-C   1 year ago Viral illness   [facility] Danelle Berry, PA-C   1 year ago Obesity (BMI 35.0-39.9 without comorbidity)   [facility] Mecum, Oswaldo Conroy, PA-C       Future Appointments             In 2 months Zane Herald, Rudolpho Sevin, FNP [facility], [facility]   In 8 months Alba Cory, MD [facility], [facility]

## 2024-01-04 DIAGNOSIS — E669 Obesity, unspecified: Secondary | ICD-10-CM

## 2024-01-11 NOTE — Addendum Note (Signed)
Addended by: Davene Costain on: 01/11/2024 04:28 PM   Modules accepted: Orders

## 2024-01-12 ENCOUNTER — Other Ambulatory Visit: Payer: Self-pay | Admitting: Nurse Practitioner

## 2024-01-12 DIAGNOSIS — E669 Obesity, unspecified: Secondary | ICD-10-CM

## 2024-01-12 MED ORDER — WEGOVY 1.7 MG/0.75ML ~~LOC~~ SOAJ: 1.7000 mg | SUBCUTANEOUS | 0 refills | Status: DC

## 2024-01-12 NOTE — Telephone Encounter (Signed)
Requested medication (s) are due for refill today -no  Requested medication (s) are on the active medication list -yes  Future visit scheduled -yes  Last refill: 01/12/24  Notes to clinic:   Pharmacy comment: Alternative Requested:PRIOR AUTH REQUIRED.    Requested Prescriptions  Pending Prescriptions Disp Refills   WEGOVY 1.7 MG/0.75ML SOAJ [Pharmacy Med Name: WEGOVY 1.7 MG/0.75 ML PEN]  0    Sig: Inject 1.7 mg into the skin once a week.     Endocrinology:  Diabetes - GLP-1 Receptor Agonists - semaglutide Passed - 01/12/2024  2:35 PM      Passed - HBA1C in normal range and within 180 days    Hgb A1c MFr Bld  Date Value Ref Range Status  08/14/2023 5.4 <5.7 % of total Hgb Final    Comment:    For the purpose of screening for the presence of diabetes: . <5.7%       Consistent with the absence of diabetes 5.7-6.4%    Consistent with increased risk for diabetes             (prediabetes) > or =6.5%  Consistent with diabetes . This assay result is consistent with a decreased risk of diabetes. . Currently, no consensus exists regarding use of hemoglobin A1c for diagnosis of diabetes in children. . According to American Diabetes Association (ADA) guidelines, hemoglobin A1c <7.0% represents optimal control in non-pregnant diabetic patients. Different metrics may apply to specific patient populations.  Standards of Medical Care in Diabetes(ADA). .          Passed - Cr in normal range and within 360 days    Creat  Date Value Ref Range Status  08/14/2023 0.83 0.50 - 0.99 mg/dL Final         Passed - Valid encounter within last 6 months    Recent Outpatient Visits           1 month ago Obesity (BMI 35.0-39.9 without comorbidity)   [facility] Berniece Salines, FNP   5 months ago Annual physical exam   [facility] Berniece Salines, FNP   1 year ago Annual physical exam   [facility] Danelle Berry, PA-C   1 year ago Viral illness   [facility] Danelle Berry, PA-C   1 year ago Obesity (BMI 35.0-39.9 without comorbidity)   [facility] Mecum, Oswaldo Conroy, PA-C       Future Appointments             In 1 month Zane Herald, Rudolpho Sevin, FNP [facility], [facility]   In 7 months Alba Cory, MD [facility], [facility]               Requested Prescriptions  Pending Prescriptions Disp Refills   WEGOVY 1.7 MG/0.75ML SOAJ [Pharmacy Med Name: WEGOVY 1.7 MG/0.75 ML PEN]  0    Sig: Inject 1.7 mg into the skin once a week.     Endocrinology:  Diabetes - GLP-1 Receptor Agonists - semaglutide Passed - 01/12/2024  2:35 PM      Passed - HBA1C in normal range and within 180 days    Hgb A1c MFr Bld  Date Value Ref Range Status  08/14/2023 5.4 <5.7 % of total Hgb Final    Comment:    For the purpose of screening for the presence of diabetes: . <5.7%  Consistent with the absence of diabetes 5.7-6.4%    Consistent with increased risk for diabetes             (prediabetes) > or =6.5%  Consistent with diabetes . This assay result is consistent with a decreased risk of diabetes. . Currently, no consensus exists regarding use of hemoglobin A1c for diagnosis of diabetes in children. . According to American Diabetes Association (ADA) guidelines, hemoglobin A1c <7.0% represents optimal control in non-pregnant diabetic patients. Different metrics may apply to specific patient populations.  Standards of Medical Care in Diabetes(ADA). .          Passed - Cr in normal range and within 360 days    Creat  Date Value Ref Range Status  08/14/2023 0.83 0.50 - 0.99 mg/dL Final         Passed - Valid encounter within last 6 months    Recent Outpatient Visits           1 month ago Obesity (BMI 35.0-39.9 without comorbidity)   [facility] Berniece Salines, FNP   5  months ago Annual physical exam   [facility] Berniece Salines, FNP   1 year ago Annual physical exam   [facility] Danelle Berry, PA-C   1 year ago Viral illness   [facility], [facility]-C   1 year ago Obesity (BMI 35.0-39.9 without comorbidity)   [facility] Mecum, Oswaldo Conroy, PA-C       Future Appointments             In 1 month Zane Herald, Rudolpho Sevin, FNP [facility], [facility]   In 7 months Alba Cory, MD [facility], [facility]

## 2024-01-13 ENCOUNTER — Telehealth: Payer: Self-pay | Admitting: Family Medicine

## 2024-01-13 NOTE — Telephone Encounter (Signed)
Rlober from [employer], states prior Berkley Harvey is already on file and Minerva Areola has been approved until July of this year

## 2024-02-17 ENCOUNTER — Other Ambulatory Visit: Payer: Self-pay

## 2024-02-17 ENCOUNTER — Ambulatory Visit (INDEPENDENT_AMBULATORY_CARE_PROVIDER_SITE_OTHER): Payer: 59 | Admitting: Nurse Practitioner

## 2024-02-17 ENCOUNTER — Encounter: Payer: Self-pay | Admitting: Nurse Practitioner

## 2024-02-17 VITALS — BP 124/76 | HR 87 | Temp 98.1°F | Resp 16 | Ht 69.5 in | Wt 237.4 lb

## 2024-02-17 DIAGNOSIS — E038 Other specified hypothyroidism: Secondary | ICD-10-CM

## 2024-02-17 DIAGNOSIS — E669 Obesity, unspecified: Secondary | ICD-10-CM | POA: Diagnosis not present

## 2024-02-17 DIAGNOSIS — I10 Essential (primary) hypertension: Secondary | ICD-10-CM

## 2024-02-17 DIAGNOSIS — F3341 Major depressive disorder, recurrent, in partial remission: Secondary | ICD-10-CM | POA: Diagnosis not present

## 2024-02-17 DIAGNOSIS — F411 Generalized anxiety disorder: Secondary | ICD-10-CM

## 2024-02-17 DIAGNOSIS — N926 Irregular menstruation, unspecified: Secondary | ICD-10-CM

## 2024-02-17 DIAGNOSIS — Z6834 Body mass index (BMI) 34.0-34.9, adult: Secondary | ICD-10-CM

## 2024-02-17 MED ORDER — BUPROPION HCL ER (XL) 300 MG PO TB24: ORAL_TABLET | ORAL | 3 refills | Status: DC

## 2024-02-17 NOTE — Assessment & Plan Note (Signed)
 Pt taking hydrochlorothiazide 25 mg daily, current BP was stable at 124/76. Will continue current treatment and reassess at next follow up appointment

## 2024-02-17 NOTE — Assessment & Plan Note (Addendum)
 Pt currently taking wegovy 1.7mg  weekly, with reassuring results. Will continue current dose and recheck weight at next follow up appointment.   Co-morbidities: HTN, HLD, hypothyroidism,

## 2024-02-17 NOTE — Assessment & Plan Note (Addendum)
 Pt currently taking levothryoxine 75 mcg daily without side effects. Labs pending

## 2024-02-17 NOTE — Progress Notes (Signed)
 BP 124/76 (Cuff Size: Large)   Pulse 87   Temp 98.1 F (36.7 C) (Oral)   Resp 16   Ht 5' 9.5" (1.765 m)   Wt 237 lb 6.4 oz (107.7 kg)   SpO2 98%   BMI 34.56 kg/m    Subjective:    Patient ID: Natalie Petersen, female    DOB: 1976-10-24, 48 y.o.   MRN: [id number]  HPI: Natalie Petersen is a 48 y.o. female  Chief Complaint  Patient presents with   Obesity    Discuss weight   Obesity: Pt returns to clinic for follow up appointment for obesity medication. Pt has lost 11 lbs since last visit, denies any side effects. Pt also reports irregular periods and concerned about associated perimenopausal symptoms. Comorbidities include HTN, HLD, hypothyroid, OSA. Currently on wegovy 1.7 mg weekly.  Patient has a history of hypothyroidism which is managed with levothyroxine 75 mcg daily.    HTN: patient blood pressure controlled with hydrochlorothiazide 25 mg daily.  Blood pressure at goal today. Denies any chest pain, shortness of breath, headaches or blurred vision.   HLD:  managed with diet  Depression/anxiety: managed with wellbutrin 450 mg daily.     11/17/2023    7:40 AM 08/14/2023   11:17 AM 08/08/2022    9:04 AM  Depression screen PHQ 2/9  Decreased Interest 0 0   Down, Depressed, Hopeless 0 0 0  PHQ - 2 Score 0 0 0  Altered sleeping  0 0  Tired, decreased energy  0 0  Change in appetite  0 0  Feeling bad or failure about yourself   0 0  Trouble concentrating  0 0  Moving slowly or fidgety/restless  0 0  Suicidal thoughts  0 0  PHQ-9 Score  0 0  Difficult doing work/chores  Not difficult at all Not difficult at all    Relevant past medical, surgical, family and social history reviewed and updated as indicated. Interim medical history since our last visit reviewed. Allergies and medications reviewed and updated.  Review of Systems Constitutional: Negative for fever or weight change.  Respiratory: Negative for cough and shortness of breath.   Cardiovascular: Negative for chest  pain or palpitations.  Gastrointestinal: Negative for abdominal pain, no bowel changes.  Musculoskeletal: Negative for gait problem or joint swelling.  Skin: Negative for rash.  Neurological: Negative for dizziness or headache.  No other specific complaints in a complete review of systems (except as listed in HPI above).  Per HPI unless specifically indicated above     Objective:    BP 124/76 (Cuff Size: Large)   Pulse 87   Temp 98.1 F (36.7 C) (Oral)   Resp 16   Ht 5' 9.5" (1.765 m)   Wt 237 lb 6.4 oz (107.7 kg)   SpO2 98%   BMI 34.56 kg/m    Wt Readings from Last 3 Encounters:  02/17/24 237 lb 6.4 oz (107.7 kg)  11/17/23 248 lb 3.2 oz (112.6 kg)  08/14/23 269 lb 6.4 oz (122.2 kg)    Physical Exam Vitals reviewed.  Constitutional:      Appearance: Normal appearance.  HENT:     Head: Normocephalic.  Cardiovascular:     Rate and Rhythm: Normal rate and regular rhythm.  Pulmonary:     Effort: Pulmonary effort is normal.     Breath sounds: Normal breath sounds.  Musculoskeletal:        General: Normal range of motion.  Skin:  General: Skin is warm and dry.  Neurological:     General: No focal deficit present.     Mental Status: She is alert and oriented to person, place, and time. Mental status is at baseline.  Psychiatric:        Mood and Affect: Mood normal.        Behavior: Behavior normal.        Thought Content: Thought content normal.        Judgment: Judgment normal.     Results for orders placed or performed in visit on 08/14/23  CBC with Differential/Platelet   Collection Time: 08/14/23 12:23 PM  Result Value Ref Range   WBC 6.3 3.8 - 10.8 Thousand/uL   RBC 5.09 3.80 - 5.10 Million/uL   Hemoglobin 14.0 11.7 - 15.5 g/dL   HCT 78.4 69.6 - 29.5 %   MCV 83.7 80.0 - 100.0 fL   MCH 27.5 27.0 - 33.0 pg   MCHC 32.9 32.0 - 36.0 g/dL   RDW 28.4 13.2 - 44.0 %   Platelets 378 140 - 400 Thousand/uL   MPV 9.9 7.5 - 12.5 fL   Neutro Abs 3,553 1,500 - 7,800  cells/uL   Lymphs Abs 2,186 850 - 3,900 cells/uL   Absolute Monocytes 428 200 - 950 cells/uL   Eosinophils Absolute 82 15 - 500 cells/uL   Basophils Absolute 50 0 - 200 cells/uL   Neutrophils Relative % 56.4 %   Total Lymphocyte 34.7 %   Monocytes Relative 6.8 %   Eosinophils Relative 1.3 %   Basophils Relative 0.8 %  COMPLETE METABOLIC PANEL WITH GFR   Collection Time: 08/14/23 12:23 PM  Result Value Ref Range   Glucose, Bld 81 65 - 99 mg/dL   BUN 12 7 - 25 mg/dL   Creat 1.02 7.25 - 3.66 mg/dL   eGFR 87 > OR = 60 YQ/IHK/7.42V9   BUN/Creatinine Ratio SEE NOTE: 6 - 22 (calc)   Sodium 139 135 - 146 mmol/L   Potassium 4.0 3.5 - 5.3 mmol/L   Chloride 102 98 - 110 mmol/L   CO2 28 20 - 32 mmol/L   Calcium 9.5 8.6 - 10.2 mg/dL   Total Protein 6.7 6.1 - 8.1 g/dL   Albumin 4.2 3.6 - 5.1 g/dL   Globulin 2.5 1.9 - 3.7 g/dL (calc)   AG Ratio 1.7 1.0 - 2.5 (calc)   Total Bilirubin 0.6 0.2 - 1.2 mg/dL   Alkaline phosphatase (APISO) 69 31 - 125 U/L   AST 13 10 - 35 U/L   ALT 15 6 - 29 U/L  Lipid panel   Collection Time: 08/14/23 12:23 PM  Result Value Ref Range   Cholesterol 196 <200 mg/dL   HDL 57 > OR = 50 mg/dL   Triglycerides 62 <563 mg/dL   LDL Cholesterol (Calc) 124 (H) mg/dL (calc)   Total CHOL/HDL Ratio 3.4 <5.0 (calc)   Non-HDL Cholesterol (Calc) 139 (H) <130 mg/dL (calc)  Hemoglobin O7F   Collection Time: 08/14/23 12:23 PM  Result Value Ref Range   Hgb A1c MFr Bld 5.4 <5.7 % of total Hgb   Mean Plasma Glucose 108 mg/dL   eAG (mmol/L) 6.0 mmol/L  TSH   Collection Time: 08/14/23 12:23 PM  Result Value Ref Range   TSH 0.73 mIU/L  VITAMIN D 25 Hydroxy (Vit-D Deficiency, Fractures)   Collection Time: 08/14/23 12:23 PM  Result Value Ref Range   Vit D, 25-Hydroxy 58 30 - 100 ng/mL  Estrogens, total  Collection Time: 08/14/23 12:23 PM  Result Value Ref Range   Estrogen 135 pg/mL  FSH/LH   Collection Time: 08/14/23 12:23 PM  Result Value Ref Range   FSH 14.6 mIU/mL    LH 2.8 mIU/mL  Testosterone, Total, LC/MS/MS   Collection Time: 08/14/23 12:23 PM  Result Value Ref Range   Testosterone, Total, LC-MS-MS 17 2 - 45 ng/dL       Assessment & Plan:   Problem List Items Addressed This Visit       Cardiovascular and Mediastinum   Essential hypertension, benign (Chronic)   Pt taking hydrochlorothiazide 25 mg daily, current BP was stable at 124/76. Will continue current treatment and reassess at next follow up appointment        Endocrine   Subclinical hypothyroidism   Pt currently taking levothryoxine 75 mcg daily without side effects. Labs pending      Relevant Orders   TSH     Other   Obesity (BMI 35.0-39.9 without comorbidity) - Primary   Pt currently taking wegovy 1.7mg  weekly, with reassuring results. Will continue current dose and recheck weight at next follow up appointment.   Co-morbidities: HTN, HLD, hypothyroidism,       MDD (major depressive disorder), recurrent, in partial remission (HCC)   Relevant Medications   buPROPion (WELLBUTRIN XL) 300 MG 24 hr tablet   GAD (generalized anxiety disorder)   Relevant Medications   buPROPion (WELLBUTRIN XL) 300 MG 24 hr tablet   Other Visit Diagnoses       Irregular periods       patient requesting hormones to be checked due to having irregular periods.   Relevant Orders   FSH/LH   TSH   Estrogens, total   17-Hydroxyprogesterone       Will check hormone levels d/t patient's c/o irregular periods (1 menstrual cycle in November, none in December, and 2 in January), along with unexplainable mood swings. Pt expressed interest in HRT.    Follow up plan: Return in about 6 months (around 08/16/2024) for follow up, tapia.

## 2024-02-19 ENCOUNTER — Encounter: Payer: Self-pay | Admitting: Nurse Practitioner

## 2024-02-22 ENCOUNTER — Other Ambulatory Visit: Payer: Self-pay | Admitting: Nurse Practitioner

## 2024-02-22 DIAGNOSIS — N951 Menopausal and female climacteric states: Secondary | ICD-10-CM

## 2024-02-23 LAB — 17-HYDROXYPROGESTERONE: 17-OH-Progesterone, LC/MS/MS: 20 ng/dL

## 2024-02-23 LAB — FSH/LH
FSH: 9.4 m[IU]/mL
LH: 3.3 m[IU]/mL

## 2024-02-23 LAB — ESTROGENS, TOTAL: Estrogen: 186 pg/mL

## 2024-02-23 LAB — TSH: TSH: 2.4 m[IU]/L

## 2024-02-24 ENCOUNTER — Other Ambulatory Visit: Payer: Self-pay | Admitting: Nurse Practitioner

## 2024-02-24 DIAGNOSIS — N951 Menopausal and female climacteric states: Secondary | ICD-10-CM

## 2024-02-24 MED ORDER — COMBIPATCH 0.05-0.14 MG/DAY TD PTTW: 1.0000 | MEDICATED_PATCH | TRANSDERMAL | 12 refills | Status: DC

## 2024-03-02 ENCOUNTER — Other Ambulatory Visit: Payer: Self-pay | Admitting: Nurse Practitioner

## 2024-03-02 DIAGNOSIS — E669 Obesity, unspecified: Secondary | ICD-10-CM

## 2024-03-02 NOTE — Telephone Encounter (Signed)
 Requested medication (s) are due for refill today:   Yes  Requested medication (s) are on the active medication list:   Yes  Future visit scheduled:   Yes.   LOV 02/17/2024   Last ordered: 01/12/2024 9 ml, 0 refills  Unable to refill because A1C id due   Requested Prescriptions  Pending Prescriptions Disp Refills   WEGOVY 1.7 MG/0.75ML SOAJ [Pharmacy Med Name: WEGOVY 1.7 MG/0.75 ML PEN]  2    Sig: Inject 1.7 mg into the skin once a week.     Endocrinology:  Diabetes - GLP-1 Receptor Agonists - semaglutide Failed - 03/02/2024  3:06 PM      Failed - HBA1C in normal range and within 180 days    Hgb A1c MFr Bld  Date Value Ref Range Status  08/14/2023 5.4 <5.7 % of total Hgb Final    Comment:    For the purpose of screening for the presence of diabetes: . <5.7%       Consistent with the absence of diabetes 5.7-6.4%    Consistent with increased risk for diabetes             (prediabetes) > or =6.5%  Consistent with diabetes . This assay result is consistent with a decreased risk of diabetes. . Currently, no consensus exists regarding use of hemoglobin A1c for diagnosis of diabetes in children. . According to American Diabetes Association (ADA) guidelines, hemoglobin A1c <7.0% represents optimal control in non-pregnant diabetic patients. Different metrics may apply to specific patient populations.  Standards of Medical Care in Diabetes(ADA). .          Passed - Cr in normal range and within 360 days    Creat  Date Value Ref Range Status  08/14/2023 0.83 0.50 - 0.99 mg/dL Final         Passed - Valid encounter within last 6 months    Recent Outpatient Visits           3 months ago Obesity (BMI 35.0-39.9 without comorbidity)   [facility] Berniece Salines, FNP   6 months ago Annual physical exam   [facility] Berniece Salines, FNP   1 year ago Annual physical exam   [facility] Danelle Berry, PA-C   1 year ago Viral illness   [facility] Danelle Berry, PA-C   2 years ago Obesity (BMI 35.0-39.9 without comorbidity)   [facility] Mecum, Oswaldo Conroy, PA-C       Future Appointments             In 5 months Alba Cory, MD [facility], [facility]

## 2024-03-04 ENCOUNTER — Telehealth: Payer: Self-pay | Admitting: Family Medicine

## 2024-03-04 NOTE — Telephone Encounter (Signed)
 Refill request from [facility]  estradiol-norethindrone [facility]) 0.05-0.14 MG/DAY

## 2024-03-04 NOTE — Telephone Encounter (Signed)
 Medication sent 02/25/24

## 2024-03-09 ENCOUNTER — Other Ambulatory Visit: Payer: Self-pay | Admitting: Nurse Practitioner

## 2024-03-09 DIAGNOSIS — E669 Obesity, unspecified: Secondary | ICD-10-CM

## 2024-03-09 MED ORDER — WEGOVY 2.4 MG/0.75ML ~~LOC~~ SOAJ: 2.4000 mg | SUBCUTANEOUS | 6 refills | Status: DC

## 2024-03-23 ENCOUNTER — Telehealth: Payer: Self-pay | Admitting: Family Medicine

## 2024-03-23 NOTE — Telephone Encounter (Signed)
 Refill from [facility]  estradiol-norethindrone [facility]) 0.05-0.14 MG/DAY

## 2024-04-26 ENCOUNTER — Encounter: Payer: Self-pay | Admitting: Nurse Practitioner

## 2024-05-02 ENCOUNTER — Encounter: Payer: Self-pay | Admitting: Nurse Practitioner

## 2024-05-02 ENCOUNTER — Other Ambulatory Visit: Payer: Self-pay

## 2024-05-02 ENCOUNTER — Ambulatory Visit (INDEPENDENT_AMBULATORY_CARE_PROVIDER_SITE_OTHER): Admitting: Nurse Practitioner

## 2024-05-02 VITALS — BP 120/82 | HR 77 | Temp 98.1°F | Resp 16 | Ht 69.5 in | Wt 233.3 lb

## 2024-05-02 DIAGNOSIS — E669 Obesity, unspecified: Secondary | ICD-10-CM | POA: Diagnosis not present

## 2024-05-02 MED ORDER — TIRZEPATIDE-WEIGHT MANAGEMENT 10 MG/0.5ML ~~LOC~~ SOAJ: 10.0000 mg | SUBCUTANEOUS | 0 refills | Status: DC

## 2024-05-02 NOTE — Progress Notes (Signed)
 BP 120/82 (Cuff Size: Large)   Pulse 77   Temp 98.1 F (36.7 C) (Oral)   Resp 16   Ht 5' 9.5" (1.765 m)   Wt 233 lb 4.8 oz (105.8 kg)   SpO2 98%   BMI 33.96 kg/m    Subjective:    Patient ID: Natalie Petersen, female    DOB: 03-23-1976, 48 y.o.   MRN: [id number]  HPI: Natalie Petersen is a 48 y.o. female  Chief Complaint  Patient presents with   Obesity    Discuss options, feel she is at at stand still with Wegovy     Discussed the use of AI scribe software for clinical note transcription with the patient, who gave verbal consent to proceed.  History of Present Illness Natalie Petersen is a 48 year old female who presents with concerns about a weight loss plateau while on Wegovy .  She is currently on Wegovy , 2.4 mg weekly, for weight management. Her current weight is 233 pounds, with a BMI of 33.96. Despite being on Wegovy , her weight loss has stalled, as she has not lost any more weight recently. Her weight in 2023 was 260 pounds, indicating a significant weight loss over the past few years.  She has previously been on Saxenda . She is considering switching to Zepbound, seeking a medication to help overcome the current plateau. She uses Weight Watchers as part of her weight management strategy but feels she needs additional support to manage 'food noise'.          11/17/2023    7:40 AM 08/14/2023   11:17 AM 08/08/2022    9:04 AM  Depression screen PHQ 2/9  Decreased Interest 0 0   Down, Depressed, Hopeless 0 0 0  PHQ - 2 Score 0 0 0  Altered sleeping  0 0  Tired, decreased energy  0 0  Change in appetite  0 0  Feeling bad or failure about yourself   0 0  Trouble concentrating  0 0  Moving slowly or fidgety/restless  0 0  Suicidal thoughts  0 0  PHQ-9 Score  0 0  Difficult doing work/chores  Not difficult at all Not difficult at all    Relevant past medical, surgical, family and social history reviewed and updated as indicated. Interim medical history since our last visit  reviewed. Allergies and medications reviewed and updated.  Review of Systems  Constitutional: Negative for fever or weight change.  Respiratory: Negative for cough and shortness of breath.   Cardiovascular: Negative for chest pain or palpitations.  Gastrointestinal: Negative for abdominal pain, no bowel changes.  Musculoskeletal: Negative for gait problem or joint swelling.  Skin: Negative for rash.  Neurological: Negative for dizziness or headache.  No other specific complaints in a complete review of systems (except as listed in HPI above).      Objective:      BP 120/82 (Cuff Size: Large)   Pulse 77   Temp 98.1 F (36.7 C) (Oral)   Resp 16   Ht 5' 9.5" (1.765 m)   Wt 233 lb 4.8 oz (105.8 kg)   SpO2 98%   BMI 33.96 kg/m    Wt Readings from Last 3 Encounters:  05/02/24 233 lb 4.8 oz (105.8 kg)  02/17/24 237 lb 6.4 oz (107.7 kg)  11/17/23 248 lb 3.2 oz (112.6 kg)    Physical Exam Vitals reviewed.  Constitutional:      Appearance: Normal appearance.  HENT:     Head: Normocephalic.  Cardiovascular:     Rate and Rhythm: Normal rate and regular rhythm.  Pulmonary:     Effort: Pulmonary effort is normal.     Breath sounds: Normal breath sounds.  Musculoskeletal:        General: Normal range of motion.  Skin:    General: Skin is warm and dry.  Neurological:     General: No focal deficit present.     Mental Status: She is alert and oriented to person, place, and time. Mental status is at baseline.  Psychiatric:        Mood and Affect: Mood normal.        Behavior: Behavior normal.        Thought Content: Thought content normal.        Judgment: Judgment normal.                Assessment & Plan:   Problem List Items Addressed This Visit       Other   Obesity (BMI 35.0-39.9 without comorbidity) - Primary   Relevant Medications   tirzepatide (ZEPBOUND) 10 MG/0.5ML Pen   Other Relevant Orders   Amb ref to Medical Nutrition Therapy-MNT      Assessment and Plan Assessment & Plan Obesity Obesity with a current weight of 233 pounds and a BMI of 33.96. Previously on Wegovy  with significant weight loss from 260 pounds in 2023, but currently experiencing a plateau. Discussed switching to Zepbound, which targets two hormone receptors and may cause less nausea. Explored the option of a comprehensive weight management program in [place], which includes a multidisciplinary team approach. Emphasized the importance of dietary and lifestyle modifications alongside pharmacotherapy. She expressed a goal weight of 175-180 pounds. - Prescribe Zepbound starting at 10 mg, with potential titration to 12.5 mg and 15 mg. - Continue Wegovy  until Zepbound is approved and available. - Initiate prior authorization process for Zepbound. - Refer to a local nutritionist for dietary guidance. - Consider [facility] if further weight management support is needed.     Follow up plan: Return in about 3 months (around 08/02/2024) for follow up with Leisa.

## 2024-05-03 ENCOUNTER — Other Ambulatory Visit (HOSPITAL_COMMUNITY): Payer: Self-pay

## 2024-05-03 ENCOUNTER — Telehealth: Payer: Self-pay | Admitting: Pharmacy Technician

## 2024-05-03 NOTE — Telephone Encounter (Signed)
 Pharmacy Patient Advocate Encounter   Received notification from Onbase that prior authorization for Zepbound 10MG /0.5ML pen-injectors is required/requested.   Insurance verification completed.   The patient is insured through [facility] .   Per test claim: PA required; PA submitted to above mentioned insurance via CoverMyMeds Key/confirmation #/EOC [id number] Status is pending

## 2024-05-04 ENCOUNTER — Other Ambulatory Visit (HOSPITAL_COMMUNITY): Payer: Self-pay

## 2024-05-04 NOTE — Telephone Encounter (Signed)
 Pharmacy Patient Advocate Encounter  Received notification from [facility] that Prior Authorization for  Zepbound 10MG /0.5ML pen-injectors has been APPROVED from 05/04/24 to 11/04/24. Ran test claim, Copay is $51.72. This test claim was processed through [facility]- copay amounts may vary at other pharmacies due to pharmacy/plan contracts, or as the patient moves through the different stages of their insurance plan.   PA #/Case ID/Reference #: [id number]

## 2024-05-31 ENCOUNTER — Other Ambulatory Visit: Payer: Self-pay | Admitting: Family Medicine

## 2024-05-31 ENCOUNTER — Telehealth: Payer: Self-pay | Admitting: Nurse Practitioner

## 2024-05-31 ENCOUNTER — Other Ambulatory Visit: Payer: Self-pay | Admitting: Nurse Practitioner

## 2024-05-31 ENCOUNTER — Encounter: Payer: Self-pay | Admitting: Nurse Practitioner

## 2024-05-31 DIAGNOSIS — N951 Menopausal and female climacteric states: Secondary | ICD-10-CM

## 2024-05-31 DIAGNOSIS — I10 Essential (primary) hypertension: Secondary | ICD-10-CM

## 2024-05-31 DIAGNOSIS — Z1231 Encounter for screening mammogram for malignant neoplasm of breast: Secondary | ICD-10-CM

## 2024-05-31 MED ORDER — COMBIPATCH 0.05-0.14 MG/DAY TD PTTW
1.0000 | MEDICATED_PATCH | TRANSDERMAL | 0 refills | Status: DC
Start: 2024-06-02 — End: 2024-05-31

## 2024-05-31 MED ORDER — COMBIPATCH 0.05-0.14 MG/DAY TD PTTW: 1.0000 | MEDICATED_PATCH | TRANSDERMAL | 0 refills | Status: DC

## 2024-05-31 NOTE — Telephone Encounter (Signed)
 No refill needed given a year supply

## 2024-05-31 NOTE — Telephone Encounter (Signed)
 estradiol -norethindrone  (COMBIPATCH ) 0.05-0.14 MG/DAY

## 2024-06-01 NOTE — Telephone Encounter (Signed)
 Too soon for refill. Refilled 09/07/23 for 90 and 3 refills.  Requested Prescriptions  Pending Prescriptions Disp Refills   hydrochlorothiazide  (HYDRODIURIL ) 25 MG tablet [Pharmacy Med Name: hydroCHLOROthiazide  25 MG Oral Tablet] 90 tablet 3    Sig: TAKE 1 TABLET BY MOUTH DAILY     Cardiovascular: Diuretics - Thiazide Failed - 06/01/2024  2:05 PM      Failed - Cr in normal range and within 180 days    Creat  Date Value Ref Range Status  08/14/2023 0.83 0.50 - 0.99 mg/dL Final         Failed - K in normal range and within 180 days    Potassium  Date Value Ref Range Status  08/14/2023 4.0 3.5 - 5.3 mmol/L Final         Failed - Na in normal range and within 180 days    Sodium  Date Value Ref Range Status  08/14/2023 139 135 - 146 mmol/L Final  04/20/2018 141 134 - 144 mmol/L Final         Passed - Last BP in normal range    BP Readings from Last 1 Encounters:  05/02/24 120/82         Passed - Valid encounter within last 6 months    Recent Outpatient Visits           1 month ago Obesity (BMI 35.0-39.9 without comorbidity)   [facility] Quinton Buckler, FNP   3 months ago Obesity (BMI 35.0-39.9 without comorbidity)   [facility] Quinton Buckler, FNP       Future Appointments             In 2 months Abram Hoguet, Monalisa Angles, FNP [facility], [facility]

## 2024-06-05 ENCOUNTER — Other Ambulatory Visit: Payer: Self-pay | Admitting: Nurse Practitioner

## 2024-06-05 DIAGNOSIS — E669 Obesity, unspecified: Secondary | ICD-10-CM

## 2024-06-07 NOTE — Telephone Encounter (Signed)
 Requested medication (s) are due for refill today: na   Requested medication (s) are on the active medication list: yes   Last refill:  05/02/24 #2 ml 0 refills   Future visit scheduled: yes 08/16/24  Notes to clinic:  medication not assigned to a protocol . Do you want to refill Rx?     Requested Prescriptions  Pending Prescriptions Disp Refills   ZEPBOUND  10 MG/0.5ML Pen [Pharmacy Med Name: ZEPBOUND  10MG /0.5ML INJ (4 PF PENS)] 2 mL 0    Sig: ADMINISTER 10 MG UNDER THE SKIN 1 TIME A WEEK     Off-Protocol Failed - 06/07/2024  4:08 PM      Failed - Medication not assigned to a protocol, review manually.      Passed - Valid encounter within last 12 months    Recent Outpatient Visits           1 month ago Obesity (BMI 35.0-39.9 without comorbidity)   [facility] Donny Gall F, FNP   3 months ago Obesity (BMI 35.0-39.9 without comorbidity)   [facility] Quinton Buckler, FNP       Future Appointments             In 2 months Abram Hoguet, Monalisa Angles, FNP [facility], [facility]

## 2024-06-16 ENCOUNTER — Other Ambulatory Visit: Payer: Self-pay | Admitting: Family Medicine

## 2024-06-16 DIAGNOSIS — I10 Essential (primary) hypertension: Secondary | ICD-10-CM

## 2024-06-17 NOTE — Telephone Encounter (Signed)
 Appt 08/16/24- Rx 09/07/23 #90 3RF- 1 yr supply  Requested Prescriptions  Pending Prescriptions Disp Refills   hydrochlorothiazide  (HYDRODIURIL ) 25 MG tablet [Pharmacy Med Name: hydroCHLOROthiazide  25 MG Oral Tablet] 90 tablet 3    Sig: TAKE 1 TABLET BY MOUTH DAILY     Cardiovascular: Diuretics - Thiazide Failed - 06/17/2024  2:50 PM      Failed - Cr in normal range and within 180 days    Creat  Date Value Ref Range Status  08/14/2023 0.83 0.50 - 0.99 mg/dL Final         Failed - K in normal range and within 180 days    Potassium  Date Value Ref Range Status  08/14/2023 4.0 3.5 - 5.3 mmol/L Final         Failed - Na in normal range and within 180 days    Sodium  Date Value Ref Range Status  08/14/2023 139 135 - 146 mmol/L Final  04/20/2018 141 134 - 144 mmol/L Final         Passed - Last BP in normal range    BP Readings from Last 1 Encounters:  05/02/24 120/82         Passed - Valid encounter within last 6 months    Recent Outpatient Visits           1 month ago Obesity (BMI 35.0-39.9 without comorbidity)   [facility] Gareth Mliss FALCON, FNP   4 months ago Obesity (BMI 35.0-39.9 without comorbidity)   [facility] Gareth Mliss FALCON, FNP       Future Appointments             In 2 months Gareth, Mliss FALCON, FNP [facility], [facility]

## 2024-06-22 NOTE — Progress Notes (Signed)
 Medical Nutrition Therapy  Appointment Start time:  [phone]  Appointment End time:  1036  Primary concerns today: nutrition for weight management  Referral diagnosis: Obesity,Obesity (BMI 35.0-39.9 without comorbidity) Preferred learning style:  no preference indicated Learning readiness: ready-change in progress   NUTRITION ASSESSMENT    Clinical Medical Hx:  Past Medical History:  Diagnosis Date   Abnormal mammogram of left breast 09/04/2015   Addition imaging done: Probably benign fibroadenoma, left breast 3 o'clock location. Low risk of malignancy was discussed with the patient (radiology) and the decision was made to pursue short-term followup left diagnostic mammogram in 6 months, with the option to perform biopsy if the patient prefers.   Allergy    seasonal   Anxiety    Depression    Elevated hematocrit 10/28/2018   Refer to pulm   Menopausal symptoms    Metrorrhagia    Obesity, Class III, BMI 40-49.9 (morbid obesity) 07/16/2015   Vitamin D  deficiency     Medications:  Current Outpatient Medications:    buPROPion  (WELLBUTRIN  XL) 150 MG 24 hr tablet, Take 150 mg tab with 300 mg tab for total of 450 mg po q am, Disp: 90 tablet, Rfl: 3   buPROPion  (WELLBUTRIN  XL) 300 MG 24 hr tablet, TAKE 1 TABLET BY MOUTH IN THE  MORNING WITH 150 MG TABLET FOR  TOTAL OF 450 MG, Disp: 90 tablet, Rfl: 3   estradiol -norethindrone  (COMBIPATCH ) 0.05-0.14 MG/DAY, Place 1 patch onto the skin 2 (two) times a week., Disp: 24 patch, Rfl: 0   hydrochlorothiazide  (HYDRODIURIL ) 25 MG tablet, TAKE 1 TABLET BY MOUTH DAILY, Disp: 90 tablet, Rfl: 3   levothyroxine  (SYNTHROID ) 75 MCG tablet, Take 1 tablet (75 mcg total) by mouth daily., Disp: 90 tablet, Rfl: 1   minoxidil (LONITEN) 2.5 MG tablet, Take 2.5 mg by mouth daily., Disp: , Rfl:    ZEPBOUND  10 MG/0.5ML Pen, ADMINISTER 10 MG UNDER THE SKIN 1 TIME A WEEK, Disp: 2 mL, Rfl: 0   cyanocobalamin 1000 MCG tablet, Take 1,000 mcg by mouth daily., Disp: , Rfl:     EPINEPHrine  0.3 mg/0.3 mL IJ SOAJ injection, Inject 0.3 mg into the muscle as needed for anaphylaxis., Disp: 2 each, Rfl: 1   Labs:  Lab Results  Component Value Date   HGBA1C 5.4 08/14/2023   Lab Results  Component Value Date   CHOL 196 08/14/2023   HDL 57 08/14/2023   LDLCALC 124 (H) 08/14/2023   TRIG 62 08/14/2023   CHOLHDL 3.4 08/14/2023   Last vitamin D  Lab Results  Component Value Date   VD25OH 58 08/14/2023   Notable Signs/Symptoms:  BP Readings from Last 3 Encounters:  05/02/24 120/82  02/17/24 124/76  11/17/23 118/84   Lifestyle & Dietary Hx Pt presents today with her son. Pt reports shared shopping and cooking with her spouse. Pt reports she is working full time mostly sitting working from home. Pt reports she appetite remains high despite weekly GLP. Pt reports she tracks her body weight at home stating most recent 230#. Pt desires weight reduction to a lower BMI range and feels her weight has not decreased with medication changes over the past three month. Pt reports she is currently using weight watchers and states tracking points.  Pt reports eating out once weekly. Pt reports she discontinued CPAP about one year ago. All Pt's questions were answered during this encoutner.   Estimated daily fluid intake: unknown oz Supplements: B12, D3, K Sleep: on average 7 hours nightly Stress /  self-care:  1 out of 10 / self care includes walking, crafting Current average weekly physical activity: walking 40 minutes 4 days weekly with weighted vest   24-Hr Dietary Recall First Meal: triple zero flavored yogurt, ~1 cup of mixed fruit, 2 Tbsp protein granola or 2 eggs, avocado, 2 slices of low calorie wheat toast, water Snack: none Second Meal: [place], swiss, pickles, mayo, 2 slices of low calorie wheat bread Snack: none Third Meal: grilled chicken, broccoli, roasted potatoes or air fried salmon, salad with yogurt dressing or brussel, brown rice Snack: yaso ice cream bars,  blue bunny ice cream *2 or terra chips  Beverages: water  NUTRITION DIAGNOSIS  NB-1.1 Food and nutrition-related knowledge deficit As related to limited prior nutrition related education.  As evidenced by Pt reports and dietary  .  NUTRITION INTERVENTION  Nutrition education (E-1) on the following topics:  Fruits & Vegetables: Aim to fill half your plate with a variety of fruits and vegetables. They are rich in vitamins, minerals, and fiber, and can help reduce the risk of chronic diseases. Choose a colorful assortment of fruits and vegetables to ensure you get a wide range of nutrients. Grains and Starches: Make at least half of your grain choices whole grains, such as brown rice, whole wheat bread, and oats. Whole grains provide fiber, which aids in digestion and healthy cholesterol levels. Aim for whole forms of starchy vegetables such as potatoes, sweet potatoes, beans, peas, and corn, which are fiber rich and provide many vitamins and minerals.  Protein: Incorporate lean sources of protein, such as poultry, fish, beans, nuts, and seeds, into your meals. Protein is essential for building and repairing tissues, staying full, balancing blood sugar, as well as supporting immune function. Dairy: Include low-fat or fat-free dairy products like milk, yogurt, and cheese in your diet. Dairy foods are excellent sources of calcium and vitamin D , which are crucial for bone health.  Physical Activity: Aim for 60 minutes of physical activity daily. Regular physical activity promotes overall health-including helping to reduce risk for heart disease and diabetes, promoting mental health, and helping us  sleep better.   Handouts Provided Include  Plate Planner- Sonofi Move Your way- DHSS  Learning Style & Readiness for Change Teaching method utilized: Visual & Auditory  Demonstrated degree of understanding via: Teach Back  Barriers to learning/adherence to lifestyle change: none  Goals Established by  Pt Portion foods using the plate planner Increase physical activity-add weight resistance training Track water intake    MONITORING & EVALUATION Dietary intake, weekly physical activity  Next Steps  Patient is to return PRN.

## 2024-06-27 ENCOUNTER — Encounter: Attending: Nurse Practitioner | Admitting: Dietician

## 2024-06-27 DIAGNOSIS — E669 Obesity, unspecified: Secondary | ICD-10-CM | POA: Diagnosis present

## 2024-06-27 DIAGNOSIS — Z6835 Body mass index (BMI) 35.0-35.9, adult: Secondary | ICD-10-CM | POA: Diagnosis not present

## 2024-06-27 DIAGNOSIS — Z713 Dietary counseling and surveillance: Secondary | ICD-10-CM | POA: Diagnosis not present

## 2024-06-27 NOTE — Patient Instructions (Addendum)
 Portion foods using the plate planner Increase physical activity-add weight resistance training Track water intake-aim for 64-90 oz daily

## 2024-07-01 ENCOUNTER — Other Ambulatory Visit: Payer: Self-pay | Admitting: Internal Medicine

## 2024-07-01 DIAGNOSIS — E669 Obesity, unspecified: Secondary | ICD-10-CM

## 2024-07-04 ENCOUNTER — Other Ambulatory Visit: Payer: Self-pay | Admitting: Nurse Practitioner

## 2024-07-04 DIAGNOSIS — E669 Obesity, unspecified: Secondary | ICD-10-CM

## 2024-07-04 MED ORDER — ZEPBOUND 10 MG/0.5ML ~~LOC~~ SOAJ: 10.0000 mg | SUBCUTANEOUS | 1 refills | Status: DC

## 2024-07-04 NOTE — Telephone Encounter (Signed)
 Requested medication (s) are due for refill today: no  Requested medication (s) are on the active medication list: yes  Last refill:  07/04/24  Future visit scheduled: yes  Notes to clinic:  med not assigned to a protocol- cannot refuse    Requested Prescriptions  Pending Prescriptions Disp Refills   ZEPBOUND  10 MG/0.5ML Pen [Pharmacy Med Name: ZEPBOUND  10MG /0.5ML INJ (4 PF PENS)] 2 mL 0    Sig: ADMINISTER 10 MG UNDER THE SKIN 1 TIME A WEEK     Off-Protocol Failed - 07/04/2024 12:00 PM      Failed - Medication not assigned to a protocol, review manually.      Passed - Valid encounter within last 12 months    Recent Outpatient Visits           2 months ago Obesity (BMI 35.0-39.9 without comorbidity)   [facility] Gareth Clarity F, FNP   4 months ago Obesity (BMI 35.0-39.9 without comorbidity)   [facility] Gareth Clarity FALCON, FNP       Future Appointments             In 1 month Gareth, Clarity FALCON, FNP [facility], [facility]

## 2024-07-04 NOTE — Telephone Encounter (Signed)
 Copied from CRM [id number]. Topic: Clinical - Medication Refill >> Jul 04, 2024  8:40 AM Charlet HERO wrote: Medication: ZEPBOUND  10 MG/0.5ML Pen  Has the patient contacted their pharmacy? No No refills  This is the patient's preferred pharmacy:  [id number] - [place], [place] - [address] AT [facility] [address] [facility] [address] [place] [address] Phone: [phone] Fax: [phone]   Is this the correct pharmacy for this prescription? Yes If no, delete pharmacy and type the correct one.   Has the prescription been filled recently? Yes  Is the patient out of the medication? Yes  Has the patient been seen for an appointment in the last year OR does the patient have an upcoming appointment? Yes  Can we respond through MyChart? Yes  Agent: Please be advised that Rx refills may take up to 3 business days. We ask that you follow-up with your pharmacy.

## 2024-07-04 NOTE — Telephone Encounter (Signed)
 Requested medication (s) are due for refill today: no  Requested medication (s) are on the active medication list: yes  Last refill:  07/04/24 2 ml 1 RF  Future visit scheduled: yes  Notes to clinic:  med not assigned to a protocol   Requested Prescriptions  Pending Prescriptions Disp Refills   ZEPBOUND  10 MG/0.5ML Pen [Pharmacy Med Name: ZEPBOUND  10MG /0.5ML INJ (4 PF PENS)] 2 mL 0    Sig: ADMINISTER 10 MG UNDER THE SKIN 1 TIME A WEEK     Off-Protocol Failed - 07/04/2024 11:52 AM      Failed - Medication not assigned to a protocol, review manually.      Passed - Valid encounter within last 12 months    Recent Outpatient Visits           2 months ago Obesity (BMI 35.0-39.9 without comorbidity)   [facility] Gareth Clarity F, FNP   4 months ago Obesity (BMI 35.0-39.9 without comorbidity)   [facility] Gareth Clarity FALCON, FNP       Future Appointments             In 1 month Gareth, Clarity FALCON, FNP [facility], [facility]

## 2024-07-21 ENCOUNTER — Other Ambulatory Visit: Payer: Self-pay | Admitting: Family Medicine

## 2024-07-21 DIAGNOSIS — I10 Essential (primary) hypertension: Secondary | ICD-10-CM

## 2024-07-22 NOTE — Telephone Encounter (Signed)
 Requested Prescriptions  Pending Prescriptions Disp Refills   hydrochlorothiazide  (HYDRODIURIL ) 25 MG tablet [Pharmacy Med Name: hydroCHLOROthiazide  25 MG Oral Tablet] 90 tablet 0    Sig: TAKE 1 TABLET BY MOUTH DAILY     Cardiovascular: Diuretics - Thiazide Failed - 07/22/2024  1:50 PM      Failed - Cr in normal range and within 180 days    Creat  Date Value Ref Range Status  08/14/2023 0.83 0.50 - 0.99 mg/dL Final         Failed - K in normal range and within 180 days    Potassium  Date Value Ref Range Status  08/14/2023 4.0 3.5 - 5.3 mmol/L Final         Failed - Na in normal range and within 180 days    Sodium  Date Value Ref Range Status  08/14/2023 139 135 - 146 mmol/L Final  04/20/2018 141 134 - 144 mmol/L Final         Passed - Last BP in normal range    BP Readings from Last 1 Encounters:  05/02/24 120/82         Passed - Valid encounter within last 6 months    Recent Outpatient Visits           2 months ago Obesity (BMI 35.0-39.9 without comorbidity)   [facility] Gareth Mliss FALCON, FNP   5 months ago Obesity (BMI 35.0-39.9 without comorbidity)   [facility] Gareth Mliss FALCON, FNP       Future Appointments             In 3 weeks Gareth, Mliss FALCON, FNP [facility], [facility]

## 2024-08-15 ENCOUNTER — Encounter: Payer: Self-pay | Admitting: Family Medicine

## 2024-08-16 ENCOUNTER — Encounter: Payer: Self-pay | Admitting: Nurse Practitioner

## 2024-08-16 ENCOUNTER — Ambulatory Visit (INDEPENDENT_AMBULATORY_CARE_PROVIDER_SITE_OTHER): Admitting: Nurse Practitioner

## 2024-08-16 VITALS — BP 122/82 | HR 91 | Temp 97.9°F | Resp 18 | Ht 69.0 in | Wt 221.8 lb

## 2024-08-16 DIAGNOSIS — N951 Menopausal and female climacteric states: Secondary | ICD-10-CM

## 2024-08-16 DIAGNOSIS — I10 Essential (primary) hypertension: Secondary | ICD-10-CM

## 2024-08-16 DIAGNOSIS — E038 Other specified hypothyroidism: Secondary | ICD-10-CM

## 2024-08-16 DIAGNOSIS — E782 Mixed hyperlipidemia: Secondary | ICD-10-CM

## 2024-08-16 DIAGNOSIS — Z0000 Encounter for general adult medical examination without abnormal findings: Secondary | ICD-10-CM | POA: Diagnosis not present

## 2024-08-16 DIAGNOSIS — Z1231 Encounter for screening mammogram for malignant neoplasm of breast: Secondary | ICD-10-CM | POA: Diagnosis not present

## 2024-08-16 DIAGNOSIS — E669 Obesity, unspecified: Secondary | ICD-10-CM

## 2024-08-16 DIAGNOSIS — Z1211 Encounter for screening for malignant neoplasm of colon: Secondary | ICD-10-CM

## 2024-08-16 MED ORDER — CLONIDINE HCL 0.1 MG/24HR TD PTWK: 0.1000 mg | MEDICATED_PATCH | TRANSDERMAL | 12 refills | Status: AC

## 2024-08-16 MED ORDER — ZEPBOUND 12.5 MG/0.5ML ~~LOC~~ SOAJ: 12.5000 mg | SUBCUTANEOUS | 1 refills | Status: DC

## 2024-08-16 NOTE — Patient Instructions (Signed)
 Healthy Weight Loss Guide ?? Weight Loss Goal - Aim for 1-2 pounds per week - Target: 5-10% of your starting body weight over 3-6 months ??? Nutrition Tips -aim for 1600-1800 calories - Eat 3 meals per day , in perimenopausal women it has been shown that intermittent fasting has helped with weight loss - Fill half your plate with vegetables, a quarter with protein, a quarter with whole grains - Choose lean proteins: chicken, fish, eggs, tofu, beans - Limit: - Sugary drinks (soda, sweet tea, juice) - Fried foods and fast food - Processed snacks (chips, candy, cookies) - Drink at least 64 oz of water per day - Practice portion control and mindful eating ???? Lifestyle Habits - Track what you eat (apps like MyFitnessPal, Lose It!, or a paper log) - Get 7-9 hours of sleep per night - Manage stress (meditation, breathing exercises, counseling if needed) - Limit alcohol  (empty calories and may increase hunger) ???? Exercise Recommendations - Goal: 150 minutes per week of moderate activity (e.g., brisk walking, cycling) - Start with 10-15 minutes/day and build up gradually - Add 2 days per week of strength training (light weights, resistance bands, or bodyweight) ?? Remember: Progress > Perfection Small changes every day add up. Don't give up! - Avoid high-fat or greasy foods to reduce nausea - Focus on protein at each meal to preserve muscle mass - Stay well hydrated (at least 64 oz water per day) - Limit sugar and processed carbohydrates ?? Managing Side Effects if on weight loss medication - Eat slowly and stop eating when you feel full - Use anti-nausea strategies: ginger tea, peppermint, crackers - Talk to your provider about adjusting the dose if needed - Stool softeners or fiber supplements can help with constipation ?? Staying on Track - Track weight and non-scale victories (energy, clothing fit, labs) - Follow up with your provider regularly - Don't stop medication without  medical guidance - Combine medication with healthy habits for best results ?? Remember Weight loss medications are a tool, not a shortcut. Healthy habits matter. Be patient and consistent--small changes lead to big results.

## 2024-08-16 NOTE — Progress Notes (Signed)
 Name: Natalie Petersen   MRN: [id number]    DOB: 1976/01/09   Date:08/16/2024       Progress Note  Subjective  Chief Complaint  Chief Complaint  Patient presents with   Annual Exam   Obesity    Discuss uping zepbound    hormones    Stopped the estradiol  patch due to side effects    HPI  Patient presents for annual CPE. Discussed the use of AI scribe software for clinical note transcription with the patient, who gave verbal consent to proceed.  History of Present Illness Natalie Petersen is a 48 year old female who presents for an annual physical exam.  Hypertension and hyperlipidemia - Hypertension managed with hydrochlorothiazide  25 mg daily. - Hyperlipidemia with last LDL of 124 mg/dL. - No current symptoms related to cardiovascular disease.  Weight changes and metabolic health - Weight decreased from 240 pounds to 221 pounds after discontinuing Combipatch  one month ago. - Waist circumference reduced from 52 inches in 2019 to 41.5 inches. - Balanced diet and regular physical activity: brisk walking with a weighted vest for 40 minutes, four times per week. - Recent consultation with a nutritionist, but found advice outdated. - Normal A1c.  Perimenopausal symptoms - Discontinued Combipatch  one month ago due to weight gain. - While on Combipatch , no menstrual periods; last menstrual period in mid-August after stopping Combipatch . - Experiencing vasomotor symptoms previously managed with Combipatch . - Sleep disturbances with frequent early morning awakenings between 3:30 and 4:30 AM, difficulty returning to sleep, averaging 6 to 7 hours of sleep per night. - Increased anxiety and sensation of 'wanting to come out of my skin' since stopping Combipatch .  Sleep disturbance - Frequent early morning awakenings between 3:30 and 4:30 AM. - Difficulty returning to sleep after awakening. - Averages 6 to 7 hours of sleep per night. - Attributes sleep disturbance to perimenopause.  Mood and  anxiety symptoms - History of depression and anxiety. - Currently no depressive symptoms. - Ongoing anxiety and restlessness, described as 'like I want to come out of my skin,' associated with discontinuation of Combipatch . - Wellbutrin  450 mg daily for mood management.  Thyroid  dysfunction - Subclinical hypothyroidism managed with levothyroxine  75 mcg daily.  Obstructive sleep apnea - History of obstructive sleep apnea.  Sexual and reproductive health - Sexually active with a partner who has had a vasectomy. - No issues with incontinence. - No concerns regarding violence at home.  Cancer risk assessment - Considering genetic testing for brain, colon, and ovarian cancer through a program at [facility].    Diet: well balanced diet Exercise: walking with weighted vest for 40 min a day  Sleep: 6-7 hours Last dental exam:2 months ago Last eye exam: 6 months ago  [employer] Visit from 08/16/2024 in [facility]  AUDIT-C Score 3    Depression: Phq 9 is  negative    08/16/2024    8:46 AM 11/17/2023    7:40 AM 08/14/2023   11:17 AM 08/08/2022    9:04 AM 07/30/2022    1:53 PM  Depression screen PHQ 2/9  Decreased Interest 0 0 0  0  Down, Depressed, Hopeless 0 0 0 0 0  PHQ - 2 Score 0 0 0 0 0  Altered sleeping 0  0 0 0  Tired, decreased energy 1  0 0 0  Change in appetite 0  0 0 0  Feeling bad or failure about yourself  0  0 0 0  Trouble  concentrating 0  0 0 0  Moving slowly or fidgety/restless 0  0 0 0  Suicidal thoughts 0  0 0 0  PHQ-9 Score 1  0 0 0  Difficult doing work/chores Not difficult at all  Not difficult at all Not difficult at all Not difficult at all   Hypertension: BP Readings from Last 3 Encounters:  08/16/24 122/82  05/02/24 120/82  02/17/24 124/76   Obesity: Wt Readings from Last 3 Encounters:  08/16/24 221 lb 12.8 oz (100.6 kg)  05/02/24 233 lb 4.8 oz (105.8 kg)  02/17/24 237 lb 6.4 oz (107.7 kg)   BMI Readings from Last  3 Encounters:  08/16/24 32.75 kg/m  05/02/24 33.96 kg/m  02/17/24 34.56 kg/m    [employer] Visit from 08/16/2024 in [facility] Office Visit from 02/11/2018 in [facility]  1 41.5 inches 52 inches     Vaccines:  HPV: up to at age 63 , ask insurance if age between 28-45  Shingrix: 52-64 yo and ask insurance if covered when patient above 66 yo Pneumonia:  educated and discussed with patient. Flu:  educated and discussed with patient.  Hep C Screening: completed STD testing and prevention (HIV/chl/gon/syphilis): completed Intimate partner violence:none Sexual History : currently sexually active , vasectomy  Menstrual History/LMP/Abnormal Bleeding: LMP:08/02/2024 Incontinence Symptoms: none  Breast cancer:  - Last Mammogram: 09/24/2023, scheduled - BRCA gene screening: none  Osteoporosis: Discussed high calcium and vitamin D  supplementation, weight bearing exercises  Cervical cancer screening: 07/24/2020  Skin cancer: Discussed monitoring for atypical lesions  Colorectal cancer: 08/06/2021, order placed   Lung cancer:   Low Dose CT Chest recommended if Age 71-80 years, 20 pack-year currently smoking OR have quit w/in 15years. Patient does not qualify.   ECG: 08/17/2023  Advanced Care Planning: A voluntary discussion about advance care planning including the explanation and discussion of advance directives.  Discussed health care proxy and Living will, and the patient was able to identify a health care proxy as husband.  Patient does have a living will at present time. If patient does have living will, I have requested they bring this to the clinic to be scanned in to their chart.  Lipids: Lab Results  Component Value Date   CHOL 196 08/14/2023   CHOL 207 (H) 08/08/2022   CHOL 217 (H) 07/23/2021   Lab Results  Component Value Date   HDL 57 08/14/2023   HDL 69 08/08/2022   HDL 69 07/23/2021   Lab  Results  Component Value Date   LDLCALC 124 (H) 08/14/2023   LDLCALC 118 (H) 08/08/2022   LDLCALC 118 (H) 07/23/2021   Lab Results  Component Value Date   TRIG 62 08/14/2023   TRIG 98 08/08/2022   TRIG 178 (H) 07/23/2021   Lab Results  Component Value Date   CHOLHDL 3.4 08/14/2023   CHOLHDL 3.0 08/08/2022   CHOLHDL 3.1 07/23/2021   No results found for: LDLDIRECT  Glucose: Glucose, Bld  Date Value Ref Range Status  08/14/2023 81 65 - 99 mg/dL Final    Comment:    .            Fasting reference interval .   08/08/2022 95 65 - 99 mg/dL Final    Comment:    .            Fasting reference interval .   07/23/2021 106 (H) 65 - 99 mg/dL Final  Comment:    .            Fasting reference interval . For someone without known diabetes, a glucose value between 100 and 125 mg/dL is consistent with prediabetes and should be confirmed with a follow-up test. .    Glucose-Capillary  Date Value Ref Range Status  03/15/2018 93 65 - 99 mg/dL Final    Patient Active Problem List   Diagnosis Date Noted   Pseudoangiomatous stromal hyperplasia of breast 08/08/2022   Mixed hyperlipidemia 08/07/2022   Subclinical hypothyroidism 07/23/2021   Fibroadenoma of left breast 08/31/2020   OSA (obstructive sleep apnea) 06/18/2020   MDD (major depressive disorder), recurrent, in partial remission (HCC) 06/28/2019   GAD (generalized anxiety disorder) 06/28/2019   LVH (left ventricular hypertrophy) 04/08/2018   Essential hypertension, benign 03/30/2018   Family history of ovarian cancer 10/26/2017   Obesity (BMI 35.0-39.9 without comorbidity) 07/16/2015   Vitamin D  deficiency 07/16/2015    Past Surgical History:  Procedure Laterality Date   BRAIN SURGERY     BREAST BIOPSY Left 03/09/2020   us  bx/ neg    Family History  Problem Relation Age of Onset   Heart murmur Mother    Atrial fibrillation Mother    Arthritis Father        RA   Hyperlipidemia Father    Cancer  Maternal Aunt        ovarian and uterine    Arthritis Maternal Grandmother    Heart attack Maternal Grandfather    Stroke Paternal Grandmother    Arthritis Paternal Grandmother    Breast cancer Paternal Grandmother    Stroke Paternal Uncle     Social History   Socioeconomic History   Marital status: Married    Spouse name: Maximo Donaire   Number of children: 1   Years of education: Not on file   Highest education level: Master's degree (e.g., MA, MS, MEng, MEd, MSW, MBA)  Occupational History    Comment: full time  Tobacco Use   Smoking status: Never   Smokeless tobacco: Never  Vaping Use   Vaping status: Never Used  Substance and Sexual Activity   Alcohol  use: Yes    Alcohol /week: 4.0 standard drinks of alcohol     Types: 4 Glasses of wine per week   Drug use: No   Sexual activity: Yes    Partners: Male    Birth control/protection: None  Other Topics Concern   Not on file  Social History Narrative   Not on file   Social Drivers of Health   Financial Resource Strain: Low Risk  (08/12/2024)   Overall Financial Resource Strain (CARDIA)    Difficulty of Paying Living Expenses: Not hard at all  Food Insecurity: No Food Insecurity (08/12/2024)   Hunger Vital Sign    Worried About Running Out of Food in the Last Year: Never true    Ran Out of Food in the Last Year: Never true  Transportation Needs: No Transportation Needs (08/12/2024)   PRAPARE - Administrator, Civil Service (Medical): No    Lack of Transportation (Non-Medical): No  Physical Activity: Sufficiently Active (08/12/2024)   Exercise Vital Sign    Days of Exercise per Week: 4 days    Minutes of Exercise per Session: 40 min  Stress: No Stress Concern Present (08/12/2024)   [employer] of Occupational Health - Occupational Stress Questionnaire    Feeling of Stress: Only a little  Social Connections: Moderately Isolated (08/12/2024)  Social Connection and Isolation Panel    Frequency of  Communication with Friends and Family: More than three times a week    Frequency of Social Gatherings with Friends and Family: Once a week    Attends Religious Services: Never    Database administrator or Organizations: No    Attends Engineer, structural: Not on file    Marital Status: Married  Catering manager Violence: Not At Risk (08/16/2024)   Humiliation, Afraid, Rape, and Kick questionnaire    Fear of Current or Ex-Partner: No    Emotionally Abused: No    Physically Abused: No    Sexually Abused: No     Current Outpatient Medications:    buPROPion  (WELLBUTRIN  XL) 150 MG 24 hr tablet, Take 150 mg tab with 300 mg tab for total of 450 mg po q am, Disp: 90 tablet, Rfl: 3   buPROPion  (WELLBUTRIN  XL) 300 MG 24 hr tablet, TAKE 1 TABLET BY MOUTH IN THE  MORNING WITH 150 MG TABLET FOR  TOTAL OF 450 MG, Disp: 90 tablet, Rfl: 3   cloNIDine  (CATAPRES  - DOSED IN MG/24 HR) 0.1 mg/24hr patch, Place 1 patch (0.1 mg total) onto the skin once a week., Disp: 4 patch, Rfl: 12   EPINEPHrine  0.3 mg/0.3 mL IJ SOAJ injection, Inject 0.3 mg into the muscle as needed for anaphylaxis., Disp: 2 each, Rfl: 1   hydrochlorothiazide  (HYDRODIURIL ) 25 MG tablet, TAKE 1 TABLET BY MOUTH DAILY, Disp: 90 tablet, Rfl: 0   levothyroxine  (SYNTHROID ) 75 MCG tablet, Take 1 tablet (75 mcg total) by mouth daily., Disp: 90 tablet, Rfl: 1   minoxidil (LONITEN) 2.5 MG tablet, Take 2.5 mg by mouth daily., Disp: , Rfl:    tirzepatide  (ZEPBOUND ) 12.5 MG/0.5ML Pen, Inject 12.5 mg into the skin once a week., Disp: 6 mL, Rfl: 1   cyanocobalamin 1000 MCG tablet, Take 1,000 mcg by mouth daily., Disp: , Rfl:    estradiol -norethindrone  (COMBIPATCH ) 0.05-0.14 MG/DAY, Place 1 patch onto the skin 2 (two) times a week., Disp: 24 patch, Rfl: 0  Allergies  Allergen Reactions   Ace Inhibitors Swelling   Lisinopril  Swelling   Amoxicillin Hives and Swelling   Benadryl [Diphenhydramine Hcl (Sleep)] Hives and Swelling      ROS  Constitutional: Negative for fever or weight change.  Respiratory: Negative for cough and shortness of breath.   Cardiovascular: Negative for chest pain or palpitations.  Gastrointestinal: Negative for abdominal pain, no bowel changes.  Musculoskeletal: Negative for gait problem or joint swelling.  Skin: Negative for rash.  Neurological: Negative for dizziness or headache.  No other specific complaints in a complete review of systems (except as listed in HPI above).   Objective  Vitals:   08/16/24 0842  BP: 122/82  Pulse: 91  Resp: 18  Temp: 97.9 F (36.6 C)  SpO2: 100%  Weight: 221 lb 12.8 oz (100.6 kg)  Height: 5' 9 (1.753 m)    Body mass index is 32.75 kg/m.  Physical Exam Vitals reviewed.  Constitutional:      Appearance: Normal appearance.  HENT:     Head: Normocephalic.     Right Ear: Tympanic membrane normal.     Left Ear: Tympanic membrane normal.     Nose: Nose normal.  Eyes:     Extraocular Movements: Extraocular movements intact.     Conjunctiva/sclera: Conjunctivae normal.     Pupils: Pupils are equal, round, and reactive to light.  Neck:     Thyroid : No thyroid   mass, thyromegaly or thyroid  tenderness.  Cardiovascular:     Rate and Rhythm: Normal rate and regular rhythm.     Pulses: Normal pulses.     Heart sounds: Normal heart sounds.  Pulmonary:     Effort: Pulmonary effort is normal.     Breath sounds: Normal breath sounds.  Abdominal:     General: Bowel sounds are normal.     Palpations: Abdomen is soft.  Musculoskeletal:        General: Normal range of motion.     Cervical back: Normal range of motion and neck supple.     Right lower leg: No edema.     Left lower leg: No edema.  Skin:    General: Skin is warm and dry.     Capillary Refill: Capillary refill takes less than 2 seconds.  Neurological:     General: No focal deficit present.     Mental Status: She is alert and oriented to person, place, and time. Mental status is  at baseline.  Psychiatric:        Mood and Affect: Mood normal.        Behavior: Behavior normal.        Thought Content: Thought content normal.        Judgment: Judgment normal.      Fall Risk:    08/16/2024    8:44 AM 02/17/2024    7:56 AM 11/17/2023    7:39 AM 08/14/2023   11:16 AM 08/08/2022    9:04 AM  Fall Risk   Falls in the past year? 0 0 0 0 0  Number falls in past yr: 0 0 0 0 0  Injury with Fall? 0 0 0 0 0  Risk for fall due to :  No Fall Risks No Fall Risks  No Fall Risks  Follow up Falls evaluation completed Falls evaluation completed Falls prevention discussed  Falls prevention discussed;Education provided      Data saved with a previous flowsheet row definition     Functional Status Survey: Is the patient deaf or have difficulty hearing?: No Does the patient have difficulty seeing, even when wearing glasses/contacts?: No Does the patient have difficulty concentrating, remembering, or making decisions?: No Does the patient have difficulty walking or climbing stairs?: No Does the patient have difficulty dressing or bathing?: No Does the patient have difficulty doing errands alone such as visiting a doctor's office or shopping?: No   Assessment & Plan  Problem List Items Addressed This Visit       Cardiovascular and Mediastinum   Essential hypertension, benign (Chronic)   Relevant Medications   cloNIDine  (CATAPRES  - DOSED IN MG/24 HR) 0.1 mg/24hr patch   Other Relevant Orders   CBC with Differential/Platelet   Comprehensive metabolic panel with GFR     Endocrine   Subclinical hypothyroidism   Relevant Orders   TSH     Other   Obesity (BMI 35.0-39.9 without comorbidity)   Relevant Medications   tirzepatide  (ZEPBOUND ) 12.5 MG/0.5ML Pen   Other Relevant Orders   TSH   Mixed hyperlipidemia   Relevant Medications   cloNIDine  (CATAPRES  - DOSED IN MG/24 HR) 0.1 mg/24hr patch   Other Relevant Orders   Lipid panel   Other Visit Diagnoses        Annual physical exam    -  Primary   Relevant Orders   MM 3D SCREENING MAMMOGRAM BILATERAL BREAST   CBC with Differential/Platelet   Comprehensive metabolic panel with GFR  Lipid panel   Hemoglobin A1c   TSH     Encounter for screening mammogram for malignant neoplasm of breast       Relevant Orders   MM 3D SCREENING MAMMOGRAM BILATERAL BREAST     Perimenopausal vasomotor symptoms       Relevant Medications   cloNIDine  (CATAPRES  - DOSED IN MG/24 HR) 0.1 mg/24hr patch     Screening for colon cancer       Relevant Orders   Cologuard      Assessment and Plan Assessment & Plan Adult Wellness Visit Annual wellness visit conducted. Blood pressure is well-controlled at 122/82 mmHg. Weight is 221 lbs with a BMI of 32.75, indicating obesity. Waist circumference is 41.5 inches, reduced from 52 inches in 2019. Depression screening is negative. Last mammogram was on September 24, 2023, and she is scheduled for the next one. Last Pap smear was on July 24, 2020, and she is due next year. Last colorectal cancer screening was on August 07, 2019, and she is due for another screening. Last EKG was on August 17, 2023. She is not eligible for lung cancer screening due to non-smoking status. Last dental exam was two months ago, and last eye exam was six months ago. - Order colorectal cancer screening - Schedule mammogram - Schedule Pap smear for next year  Obesity Obesity with a BMI of 32.75. Weight has decreased from a high of 335 lbs. Waist circumference has decreased from 52 inches in 2019 to 41.5 inches. She is currently on Zepbound  10 mg weekly and plans to increase to 12.5 mg. Discussed the potential insurance issues with taking a break from Zepbound  and the need to confirm coverage before making changes. - Increase Zepbound  to 12.5 mg weekly - Confirm insurance coverage before making changes to Zepbound  regimen  Hypertension Hypertension is well-controlled with a current blood pressure of 122/82  mmHg. She is currently taking hydrochlorothiazide  25 mg daily.  Hyperlipidemia Hyperlipidemia with a last LDL of 124 mg/dL.  Subclinical hypothyroidism Subclinical hypothyroidism managed with levothyroxine . She has been taking half of the prescribed 75 mcg dose. Recent blood work results are pending to determine if further dose adjustment is needed. - Review blood work results to assess thyroid  function and adjust levothyroxine  dose if necessary  Obstructive sleep apnea Obstructive sleep apnea is a known condition.  Depression and anxiety disorder Depression screening is negative. She reports feeling anxious and like she wants to come out of her skin, possibly related to stopping the Combipatch . Currently taking Wellbutrin  450 mg daily.  Perimenopausal vasomotor symptoms Perimenopausal vasomotor symptoms previously managed with Combipatch , which was stopped due to weight gain. Discussed alternative options including clonidine  patch, which may not be effective for everyone. She will try the clonidine  patch at the lowest dose and monitor its effectiveness. Discussed that clonidine  is primarily a blood pressure medication and may not work for vasomotor symptoms in all patients. - Prescribe clonidine  patch at the lowest dose    -USPSTF grade A and B recommendations reviewed with patient; age-appropriate recommendations, preventive care, screening tests, etc discussed and encouraged; healthy living encouraged; see AVS for patient education given to patient -Discussed importance of 150 minutes of physical activity weekly, eat two servings of fish weekly, eat one serving of tree nuts ( cashews, pistachios, pecans, almonds.SABRA) every other day, eat 6 servings of fruit/vegetables daily and drink plenty of water and avoid sweet beverages.   -Reviewed Health Maintenance: yes

## 2024-08-17 ENCOUNTER — Ambulatory Visit: Payer: Self-pay | Admitting: Nurse Practitioner

## 2024-08-17 DIAGNOSIS — E039 Hypothyroidism, unspecified: Secondary | ICD-10-CM

## 2024-08-17 LAB — CBC WITH DIFFERENTIAL/PLATELET
Absolute Lymphocytes: 1814 {cells}/uL (ref 850–3900)
Absolute Monocytes: 384 {cells}/uL (ref 200–950)
Basophils Absolute: 59 {cells}/uL (ref 0–200)
Basophils Relative: 0.9 %
Eosinophils Absolute: 39 {cells}/uL (ref 15–500)
Eosinophils Relative: 0.6 %
HCT: 44.4 % (ref 35.0–45.0)
Hemoglobin: 14.6 g/dL (ref 11.7–15.5)
MCH: 28.8 pg (ref 27.0–33.0)
MCHC: 32.9 g/dL (ref 32.0–36.0)
MCV: 87.6 fL (ref 80.0–100.0)
MPV: 10 fL (ref 7.5–12.5)
Monocytes Relative: 5.9 %
Neutro Abs: 4206 {cells}/uL (ref 1500–7800)
Neutrophils Relative %: 64.7 %
Platelets: 351 Thousand/uL (ref 140–400)
RBC: 5.07 Million/uL (ref 3.80–5.10)
RDW: 13.3 % (ref 11.0–15.0)
Total Lymphocyte: 27.9 %
WBC: 6.5 Thousand/uL (ref 3.8–10.8)

## 2024-08-17 LAB — COMPREHENSIVE METABOLIC PANEL WITH GFR
AG Ratio: 1.7 (calc) (ref 1.0–2.5)
ALT: 9 U/L (ref 6–29)
AST: 11 U/L (ref 10–35)
Albumin: 4.3 g/dL (ref 3.6–5.1)
Alkaline phosphatase (APISO): 49 U/L (ref 31–125)
BUN: 13 mg/dL (ref 7–25)
CO2: 26 mmol/L (ref 20–32)
Calcium: 9.4 mg/dL (ref 8.6–10.2)
Chloride: 101 mmol/L (ref 98–110)
Creat: 0.86 mg/dL (ref 0.50–0.99)
Globulin: 2.5 g/dL (ref 1.9–3.7)
Glucose, Bld: 84 mg/dL (ref 65–99)
Potassium: 4.5 mmol/L (ref 3.5–5.3)
Sodium: 136 mmol/L (ref 135–146)
Total Bilirubin: 0.6 mg/dL (ref 0.2–1.2)
Total Protein: 6.8 g/dL (ref 6.1–8.1)
eGFR: 83 mL/min/1.73m2 (ref >=60)

## 2024-08-17 LAB — LIPID PANEL
Cholesterol: 199 mg/dL (ref <200)
HDL: 68 mg/dL (ref >=50)
LDL Cholesterol (Calc): 116 mg/dL — ABNORMAL HIGH
Non-HDL Cholesterol (Calc): 131 mg/dL — ABNORMAL HIGH (ref <130)
Total CHOL/HDL Ratio: 2.9 (calc) (ref <5.0)
Triglycerides: 59 mg/dL (ref <150)

## 2024-08-17 LAB — HEMOGLOBIN A1C
Hgb A1c MFr Bld: 4.9 % (ref <5.7)
Mean Plasma Glucose: 94 mg/dL
eAG (mmol/L): 5.2 mmol/L

## 2024-08-17 LAB — TSH: TSH: 2.72 m[IU]/L

## 2024-08-17 MED ORDER — LEVOTHYROXINE SODIUM 75 MCG PO TABS: 75.0000 ug | ORAL_TABLET | Freq: Every day | ORAL | 1 refills | Status: AC

## 2024-08-17 NOTE — Addendum Note (Signed)
 Addended by: YVONE WARREN BROCKS on: 08/17/2024 08:37 AM   Modules accepted: Orders

## 2024-08-28 ENCOUNTER — Encounter: Payer: Self-pay | Admitting: Nurse Practitioner

## 2024-08-28 DIAGNOSIS — F411 Generalized anxiety disorder: Secondary | ICD-10-CM

## 2024-08-28 DIAGNOSIS — F3341 Major depressive disorder, recurrent, in partial remission: Secondary | ICD-10-CM

## 2024-08-29 ENCOUNTER — Other Ambulatory Visit: Payer: Self-pay | Admitting: Nurse Practitioner

## 2024-08-31 ENCOUNTER — Other Ambulatory Visit: Payer: Self-pay | Admitting: Family Medicine

## 2024-08-31 DIAGNOSIS — F3341 Major depressive disorder, recurrent, in partial remission: Secondary | ICD-10-CM

## 2024-09-01 NOTE — Telephone Encounter (Signed)
 Requested Prescriptions  Pending Prescriptions Disp Refills   buPROPion  (WELLBUTRIN  XL) 150 MG 24 hr tablet [Pharmacy Med Name: buPROPion  HCl ER (XL) 150 MG Oral Tablet Extended Release 24 Hour] 90 tablet 0    Sig: TAKE 1 TABLET BY MOUTH WITH  300MG  TABLET FOR TOTAL OF 450MG   IN THE MORNING     Psychiatry: Antidepressants - bupropion  Passed - 09/01/2024 10:53 AM      Passed - Cr in normal range and within 360 days    Creat  Date Value Ref Range Status  08/16/2024 0.86 0.50 - 0.99 mg/dL Final         Passed - AST in normal range and within 360 days    AST  Date Value Ref Range Status  08/16/2024 11 10 - 35 U/L Final         Passed - ALT in normal range and within 360 days    ALT  Date Value Ref Range Status  08/16/2024 9 6 - 29 U/L Final         Passed - Completed PHQ-2 or PHQ-9 in the last 360 days      Passed - Last BP in normal range    BP Readings from Last 1 Encounters:  08/16/24 122/82         Passed - Valid encounter within last 6 months    Recent Outpatient Visits           2 weeks ago Annual physical exam   [facility] Gareth Mliss FALCON, FNP   4 months ago Obesity (BMI 35.0-39.9 without comorbidity)   [facility] Gareth Mliss F, FNP   6 months ago Obesity (BMI 35.0-39.9 without comorbidity)   [facility] Gareth Mliss FALCON, FNP       Future Appointments             In 11 months Gareth, Mliss FALCON, FNP [facility] [place]

## 2024-09-06 LAB — COLOGUARD: COLOGUARD: NEGATIVE

## 2024-09-21 ENCOUNTER — Telehealth: Payer: Self-pay | Admitting: Nurse Practitioner

## 2024-09-21 NOTE — Telephone Encounter (Unsigned)
 Copied from CRM #[id number]. Topic: General - Other >> Sep 21, 2024  2:43 PM Kevelyn M wrote: Reason for CRM: [employer] calling about FMLA paperwork, it was incomplete. One portion was not filled out. On September 15th fax sent in did not have initials and dates of changes made. Need confirmation that intials are valid.  Call back #[phone]

## 2024-09-22 NOTE — Telephone Encounter (Signed)
Fixed and refaxed

## 2024-09-26 ENCOUNTER — Ambulatory Visit
Admission: RE | Admit: 2024-09-26 | Discharge: 2024-09-26 | Disposition: A | Discharge status: Home / Self Care | Source: Ambulatory Visit | Attending: Nurse Practitioner | Admitting: Nurse Practitioner

## 2024-09-26 DIAGNOSIS — Z1231 Encounter for screening mammogram for malignant neoplasm of breast: Secondary | ICD-10-CM | POA: Diagnosis present

## 2024-10-04 ENCOUNTER — Other Ambulatory Visit: Payer: Self-pay | Admitting: Nurse Practitioner

## 2024-10-04 DIAGNOSIS — I10 Essential (primary) hypertension: Secondary | ICD-10-CM

## 2024-10-06 NOTE — Telephone Encounter (Signed)
 Requested Prescriptions  Pending Prescriptions Disp Refills   hydrochlorothiazide  (HYDRODIURIL ) 25 MG tablet [Pharmacy Med Name: hydroCHLOROthiazide  25 MG Oral Tablet] 90 tablet 1    Sig: TAKE 1 TABLET BY MOUTH DAILY     Cardiovascular: Diuretics - Thiazide Passed - 10/06/2024  3:08 PM      Passed - Cr in normal range and within 180 days    Creat  Date Value Ref Range Status  08/16/2024 0.86 0.50 - 0.99 mg/dL Final         Passed - K in normal range and within 180 days    Potassium  Date Value Ref Range Status  08/16/2024 4.5 3.5 - 5.3 mmol/L Final         Passed - Na in normal range and within 180 days    Sodium  Date Value Ref Range Status  08/16/2024 136 135 - 146 mmol/L Final  04/20/2018 141 134 - 144 mmol/L Final         Passed - Last BP in normal range    BP Readings from Last 1 Encounters:  08/16/24 122/82         Passed - Valid encounter within last 6 months    Recent Outpatient Visits           1 month ago Annual physical exam   [facility] Gareth Mliss FALCON, FNP   5 months ago Obesity (BMI 35.0-39.9 without comorbidity)   [facility] Gareth Mliss F, FNP   7 months ago Obesity (BMI 35.0-39.9 without comorbidity)   [facility] Gareth Mliss FALCON, FNP       Future Appointments             In 10 months Gareth, Mliss FALCON, FNP [facility] [place]

## 2024-10-09 ENCOUNTER — Encounter: Payer: Self-pay | Admitting: Nurse Practitioner

## 2024-10-11 ENCOUNTER — Telehealth: Payer: Self-pay | Admitting: Pharmacy Technician

## 2024-10-11 ENCOUNTER — Other Ambulatory Visit (HOSPITAL_COMMUNITY): Payer: Self-pay

## 2024-10-11 NOTE — Telephone Encounter (Signed)
 Pharmacy Patient Advocate [employer] companies are becoming increasingly stricter about requiring thorough documentation of lifestyle modifications in the patient's chart at each visit. This includes detailed records of diet recommendations (caloric intake, etc), exercise plans (amount of time/wk, etc), and an emphasis on the patient's commitment to continuing these efforts while on medication.  Without this additional documentation in the chart notes, a prior authorization will most likely be denied.  Good  morning Natalie Petersen prior authorization for zepbound  will expire 11/02/2024 and it's time to renew, we need the above as well as a weight and BMI check within the last 45 days please.  Thanks!

## 2024-10-11 NOTE — Telephone Encounter (Signed)
 Pharmacy Patient Advocate Encounter   Received notification from CoverMyMeds that prior authorization for Repatha SureClick 140MG /ML auto-injectors is due for renewal.   Insurance verification completed.   The patient is insured through [employer].  Action: PA required and submitted KEY/EOC/Request #: [id number] from 10/11/24 to 10/11/25

## 2024-10-20 ENCOUNTER — Ambulatory Visit (INDEPENDENT_AMBULATORY_CARE_PROVIDER_SITE_OTHER): Admitting: Nurse Practitioner

## 2024-10-20 ENCOUNTER — Encounter: Payer: Self-pay | Admitting: Nurse Practitioner

## 2024-10-20 VITALS — BP 148/90 | HR 70 | Temp 98.2°F | Ht 69.0 in | Wt 215.0 lb

## 2024-10-20 DIAGNOSIS — Z23 Encounter for immunization: Secondary | ICD-10-CM

## 2024-10-20 DIAGNOSIS — E66811 Obesity, class 1: Secondary | ICD-10-CM | POA: Diagnosis not present

## 2024-10-20 DIAGNOSIS — F411 Generalized anxiety disorder: Secondary | ICD-10-CM | POA: Diagnosis not present

## 2024-10-20 DIAGNOSIS — I10 Essential (primary) hypertension: Secondary | ICD-10-CM

## 2024-10-20 DIAGNOSIS — F3341 Major depressive disorder, recurrent, in partial remission: Secondary | ICD-10-CM

## 2024-10-20 MED ORDER — ZEPBOUND 12.5 MG/0.5ML ~~LOC~~ SOAJ: 12.5000 mg | SUBCUTANEOUS | 1 refills | Status: AC

## 2024-10-20 NOTE — Assessment & Plan Note (Signed)
 BP 132/98 today. Patient has been under stress and feels like that's where the elevated reading came from. She has a blood pressure cuff at home and will begin checking it. She does not want to get back on HTN medication at this time. Plan to recheck at visit in 3 months.

## 2024-10-20 NOTE — Assessment & Plan Note (Signed)
 Continue seeing virtual therapist.

## 2024-10-20 NOTE — Progress Notes (Addendum)
 BP (!) 148/90   Pulse 70   Temp 98.2 F (36.8 C)   Ht 5' 9 (1.753 m)   Wt 215 lb (97.5 kg)   SpO2 99%   BMI 31.75 kg/m    Subjective:    Patient ID: Natalie Petersen, female    DOB: Mar 22, 1976, 48 y.o.   MRN: [id number]  HPI: Natalie Petersen is a 48 y.o. female with a history of hypertension, hyperlipidemia, obesity, sleep apnea, anxiety, and depression who presents today for 3 month weight management follow-up She is currently taking Zepbound  12.5 mg weekly and has been tolerating injection well. Denies any side effects. She has been walking 40 min daily and incorporating strength training 2-3 times a week. She follows the Weight Watcher's program. She eats 3 well balanced meals with protein, vegetable, and or fruit. She is limiting sugar and saturated fats in diet.  Weight 2 months ago was 221 lbs with a BMI of 32.75. Weight today is 215 lb with a BMI of 31.75. and waist measurement is 42 inches. She wants to continue on the 12.5 mg dose of the Zepbound .    Hypertension: Not currently managed on medication due to patient's preference. She is not wanting to get back on blood pressure medication and is working on lifestyle modifications such as diet and exercise. Blood pressure today 132/98. She reports feeling stressed and wonders if that might be the contributing factor to elevated BP reading. She has a blood pressure cuff at home and will begin checking it. We will recheck at visit in 3 months.   Depression/Anxiety: Has been seeing a virtual therapist every other week, which is offered through her work. The therapy sessions have been helpful in managing her depression and anxiety. She is going to continue the visits.      08/16/2024    8:46 AM 11/17/2023    7:40 AM 08/14/2023   11:17 AM  Depression screen PHQ 2/9  Decreased Interest 0 0 0  Down, Depressed, Hopeless 0 0 0  PHQ - 2 Score 0 0 0  Altered sleeping 0  0  Tired, decreased energy 1  0  Change in appetite 0  0  Feeling bad  or failure about yourself  0  0  Trouble concentrating 0  0  Moving slowly or fidgety/restless 0  0  Suicidal thoughts 0  0  PHQ-9 Score 1  0  Difficult doing work/chores Not difficult at all  Not difficult at all    Relevant past medical, surgical, family and social history reviewed and updated as indicated. Interim medical history since our last visit reviewed. Allergies and medications reviewed and updated.  Review of Systems Ten systems reviewed and is negative except as mentioned in HPI       Objective:     BP (!) 148/90   Pulse 70   Temp 98.2 F (36.8 C)   Ht 5' 9 (1.753 m)   Wt 215 lb (97.5 kg)   SpO2 99%   BMI 31.75 kg/m    Wt Readings from Last 3 Encounters:  10/20/24 215 lb (97.5 kg)  08/16/24 221 lb 12.8 oz (100.6 kg)  05/02/24 233 lb 4.8 oz (105.8 kg)    Physical Exam Constitutional:      Appearance: Normal appearance.  HENT:     Head: Normocephalic.  Cardiovascular:     Rate and Rhythm: Normal rate and regular rhythm.     Pulses: Normal pulses.     Heart sounds:  Normal heart sounds.  Pulmonary:     Effort: Pulmonary effort is normal.     Breath sounds: Normal breath sounds.  Musculoskeletal:        General: Normal range of motion.     Cervical back: Normal range of motion and neck supple.  Skin:    General: Skin is warm and dry.  Neurological:     General: No focal deficit present.     Mental Status: She is alert and oriented to person, place, and time.  Psychiatric:        Mood and Affect: Mood normal.        Behavior: Behavior normal.        Thought Content: Thought content normal.        Judgment: Judgment normal.     Waist Measurement : 42 inches  Body mass index is 31.75 kg/m.  Results for orders placed or performed in visit on 08/16/24  CBC with Differential/Platelet   Collection Time: 08/16/24  9:34 AM  Result Value Ref Range   WBC 6.5 3.8 - 10.8 Thousand/uL   RBC 5.07 3.80 - 5.10 Million/uL   Hemoglobin 14.6 11.7 - 15.5 g/dL    HCT 55.5 64.9 - 54.9 %   MCV 87.6 80.0 - 100.0 fL   MCH 28.8 27.0 - 33.0 pg   MCHC 32.9 32.0 - 36.0 g/dL   RDW 86.6 88.9 - 84.9 %   Platelets 351 140 - 400 Thousand/uL   MPV 10.0 7.5 - 12.5 fL   Neutro Abs 4,206 1,500 - 7,800 cells/uL   Absolute Lymphocytes 1,814 850 - 3,900 cells/uL   Absolute Monocytes 384 200 - 950 cells/uL   Eosinophils Absolute 39 15 - 500 cells/uL   Basophils Absolute 59 0 - 200 cells/uL   Neutrophils Relative % 64.7 %   Total Lymphocyte 27.9 %   Monocytes Relative 5.9 %   Eosinophils Relative 0.6 %   Basophils Relative 0.9 %  Comprehensive metabolic panel with GFR   Collection Time: 08/16/24  9:34 AM  Result Value Ref Range   Glucose, Bld 84 65 - 99 mg/dL   BUN 13 7 - 25 mg/dL   Creat 9.13 9.49 - 9.00 mg/dL   eGFR 83 > OR = 60 fO/fpw/8.26f7   BUN/Creatinine Ratio SEE NOTE: 6 - 22 (calc)   Sodium 136 135 - 146 mmol/L   Potassium 4.5 3.5 - 5.3 mmol/L   Chloride 101 98 - 110 mmol/L   CO2 26 20 - 32 mmol/L   Calcium 9.4 8.6 - 10.2 mg/dL   Total Protein 6.8 6.1 - 8.1 g/dL   Albumin 4.3 3.6 - 5.1 g/dL   Globulin 2.5 1.9 - 3.7 g/dL (calc)   AG Ratio 1.7 1.0 - 2.5 (calc)   Total Bilirubin 0.6 0.2 - 1.2 mg/dL   Alkaline phosphatase (APISO) 49 31 - 125 U/L   AST 11 10 - 35 U/L   ALT 9 6 - 29 U/L  Lipid panel   Collection Time: 08/16/24  9:34 AM  Result Value Ref Range   Cholesterol 199 <200 mg/dL   HDL 68 > OR = 50 mg/dL   Triglycerides 59 <849 mg/dL   LDL Cholesterol (Calc) 116 (H) mg/dL (calc)   Total CHOL/HDL Ratio 2.9 <5.0 (calc)   Non-HDL Cholesterol (Calc) 131 (H) <130 mg/dL (calc)  Hemoglobin J8r   Collection Time: 08/16/24  9:34 AM  Result Value Ref Range   Hgb A1c MFr Bld 4.9 <5.7 %   Mean  Plasma Glucose 94 mg/dL   eAG (mmol/L) 5.2 mmol/L  TSH   Collection Time: 08/16/24  9:34 AM  Result Value Ref Range   TSH 2.72 mIU/L  Cologuard   Collection Time: 08/31/24  6:49 AM  Result Value Ref Range   COLOGUARD Negative Negative           Assessment & Plan:   Problem List Items Addressed This Visit       Cardiovascular and Mediastinum   Essential hypertension, benign - Primary (Chronic)   BP 132/98 today. Patient has been under stress and feels like that's where the elevated reading came from. She has a blood pressure cuff at home and will begin checking it. She does not want to get back on HTN medication at this time. Plan to recheck at visit in 3 months.         Other   Obesity, Class I, BMI 30.0-34.9 (see actual BMI)   Refill sent in for Zepbound  12.5 mg dose. Weight today 215 lb, which is down from weight 2 months ago (221 lbs), Waist measurement 42 inches. Plan to follow-up in 3 months to track progress.       Relevant Medications   tirzepatide  (ZEPBOUND ) 12.5 MG/0.5ML Pen   MDD (major depressive disorder), recurrent, in partial remission   Continue seeing virtual therapist.       GAD (generalized anxiety disorder)   Continue seeing virtual therapist.        Refill sent in of Zepbound  12.5 mg. Continue to work on lifestyle modifications including diet and exercise. Blood pressure elevated today, 132/98. She reported feeling stressed today. She will begin checking her blood pressure at home and tracking it. She is not interested in getting back on blood pressure medication, but would rather control with diet and exercise.    - Encourage continuation of lifestyle modifications, including dietary management and regular exercise. -continue to increase physical activity, getting at least 150 min of physical activity a week.  Work on including runner, broadcasting/film/video 2 days a week.  - continue eating at a calorie deficit 1600-1700 cal a day, eating a well balanced diet with whole foods, avoiding processed foods.   Patient is motivated to continue working on lifestyle modification.        Follow up plan: Return in about 3 months (around 01/20/2025).    I have reviewed this encounter including the documentation in  this note and/or discussed this patient with the provider, Aislinn Womack, SNP, I am certifying that I agree with the content of this note as supervising/preceptor nurse practitioner.  Mliss Spray, FNP-C [facility] 10/23/2024, 5:34 PM

## 2024-10-20 NOTE — Assessment & Plan Note (Signed)
 Refill sent in for Zepbound  12.5 mg dose. Weight today 215 lb, which is down from weight 2 months ago (221 lbs), Waist measurement 42 inches. Plan to follow-up in 3 months to track progress.

## 2024-11-22 ENCOUNTER — Telehealth: Payer: Self-pay | Admitting: Nurse Practitioner

## 2024-11-22 NOTE — Telephone Encounter (Signed)
 cloNIDine  (CATAPRES  - DOSED IN MG/24 HR) 0.1 mg/24hr patch

## 2024-11-22 NOTE — Telephone Encounter (Signed)
 Refused pt given yr supply

## 2024-11-23 ENCOUNTER — Other Ambulatory Visit: Payer: Self-pay | Admitting: Nurse Practitioner

## 2024-11-23 DIAGNOSIS — F3341 Major depressive disorder, recurrent, in partial remission: Secondary | ICD-10-CM

## 2024-11-24 ENCOUNTER — Telehealth: Payer: Self-pay | Admitting: Nurse Practitioner

## 2024-11-24 NOTE — Telephone Encounter (Signed)
 cloNIDine  (CATAPRES  - DOSED IN MG/24 HR) 0.1 mg/24hr patch

## 2024-11-24 NOTE — Telephone Encounter (Signed)
 Request to sson

## 2024-11-26 NOTE — Telephone Encounter (Signed)
 Requested Prescriptions  Pending Prescriptions Disp Refills   buPROPion  (WELLBUTRIN  XL) 150 MG 24 hr tablet [Pharmacy Med Name: buPROPion  HCl ER (XL) 150 MG Oral Tablet Extended Release 24 Hour] 90 tablet 3    Sig: TAKE 1 TABLET BY MOUTH WITH 300  MG TABLET FOR TOTAL OF 450 MG IN THE MORNING     Psychiatry: Antidepressants - bupropion  Failed - 11/26/2024  9:44 AM      Failed - Last BP in normal range    BP Readings from Last 1 Encounters:  10/20/24 (!) 148/90         Passed - Cr in normal range and within 360 days    Creat  Date Value Ref Range Status  08/16/2024 0.86 0.50 - 0.99 mg/dL Final         Passed - AST in normal range and within 360 days    AST  Date Value Ref Range Status  08/16/2024 11 10 - 35 U/L Final         Passed - ALT in normal range and within 360 days    ALT  Date Value Ref Range Status  08/16/2024 9 6 - 29 U/L Final         Passed - Completed PHQ-2 or PHQ-9 in the last 360 days      Passed - Valid encounter within last 6 months    Recent Outpatient Visits           1 month ago Essential hypertension, benign   [facility] Gareth Mliss FALCON, FNP   3 months ago Annual physical exam   [facility] Gareth Mliss FALCON, FNP   6 months ago Obesity (BMI 35.0-39.9 without comorbidity)   [facility] Gareth Mliss FALCON, FNP   9 months ago Obesity (BMI 35.0-39.9 without comorbidity)   [facility] Gareth Mliss FALCON, FNP       Future Appointments             In 8 months Gareth, Mliss FALCON, FNP [facility] [place]

## 2024-12-09 ENCOUNTER — Other Ambulatory Visit: Payer: Self-pay | Admitting: Nurse Practitioner

## 2024-12-09 ENCOUNTER — Encounter: Payer: Self-pay | Admitting: Nurse Practitioner

## 2024-12-09 DIAGNOSIS — N951 Menopausal and female climacteric states: Secondary | ICD-10-CM

## 2024-12-09 MED ORDER — CLONIDINE HCL 0.1 MG/24HR TD PTWK: 0.1000 mg | MEDICATED_PATCH | TRANSDERMAL | 1 refills | Status: DC

## 2024-12-09 MED ORDER — CLONIDINE HCL 0.1 MG/24HR TD PTWK: 0.1000 mg | MEDICATED_PATCH | TRANSDERMAL | 1 refills | Status: AC

## 2024-12-09 NOTE — Addendum Note (Signed)
 Addended by: YVONE WARREN BROCKS on: 12/09/2024 08:52 AM   Modules accepted: Orders

## 2024-12-28 ENCOUNTER — Other Ambulatory Visit: Payer: Self-pay | Admitting: Nurse Practitioner

## 2024-12-28 DIAGNOSIS — F3341 Major depressive disorder, recurrent, in partial remission: Secondary | ICD-10-CM

## 2024-12-28 DIAGNOSIS — E039 Hypothyroidism, unspecified: Secondary | ICD-10-CM

## 2024-12-29 NOTE — Telephone Encounter (Signed)
 Requested Prescriptions  Refused Prescriptions Disp Refills   buPROPion  (WELLBUTRIN  XL) 300 MG 24 hr tablet [Pharmacy Med Name: buPROPion  HCl ER (XL) 300 MG Oral Tablet Extended Release 24 Hour] 90 tablet 3    Sig: TAKE 1 TABLET BY MOUTH IN THE  MORNING WITH 150 MG TABLET FOR  TOTAL OF 450 MG     Psychiatry: Antidepressants - bupropion  Failed - 12/29/2024  3:00 PM      Failed - Last BP in normal range    BP Readings from Last 1 Encounters:  10/20/24 (!) 148/90         Passed - Cr in normal range and within 360 days    Creat  Date Value Ref Range Status  08/16/2024 0.86 0.50 - 0.99 mg/dL Final         Passed - AST in normal range and within 360 days    AST  Date Value Ref Range Status  08/16/2024 11 10 - 35 U/L Final         Passed - ALT in normal range and within 360 days    ALT  Date Value Ref Range Status  08/16/2024 9 6 - 29 U/L Final         Passed - Completed PHQ-2 or PHQ-9 in the last 360 days      Passed - Valid encounter within last 6 months    Recent Outpatient Visits           2 months ago Essential hypertension, benign   [facility] Gareth Mliss FALCON, FNP   4 months ago Annual physical exam   [facility] Gareth Mliss F, FNP   8 months ago Obesity (BMI 35.0-39.9 without comorbidity)   [facility] Gareth Mliss F, FNP   10 months ago Obesity (BMI 35.0-39.9 without comorbidity)   [facility] Gareth Mliss FALCON, FNP       Future Appointments             In 7 months Gareth, Mliss FALCON, FNP [facility] [place]             levothyroxine  (SYNTHROID ) 75 MCG tablet [Pharmacy Med Name: Levothyroxine  Sodium 75 MCG Oral Tablet] 90 tablet 3    Sig: TAKE 1 TABLET BY MOUTH DAILY     Endocrinology:  Hypothyroid Agents Passed - 12/29/2024  3:00 PM      Passed - TSH in normal range and within 360 days    TSH  Date Value Ref Range  Status  08/16/2024 2.72 mIU/L Final    Comment:              Reference Range .           > or = 20 Years  0.40-4.50 .                Pregnancy Ranges           First trimester    0.26-2.66           Second trimester   0.55-2.73           Third trimester    0.43-2.91          Passed - Valid encounter within last 12 months    Recent Outpatient Visits           2 months ago Essential hypertension, benign   [facility] Gareth Mliss FALCON, [place]  4 months ago Annual physical exam   [facility] Gareth Clarity F, FNP   8 months ago Obesity (BMI 35.0-39.9 without comorbidity)   [facility] Gareth Clarity FALCON, FNP   10 months ago Obesity (BMI 35.0-39.9 without comorbidity)   [facility] Gareth Clarity FALCON, FNP       Future Appointments             In 7 months Gareth, Clarity FALCON, FNP [facility] [place]

## 2025-01-19 ENCOUNTER — Other Ambulatory Visit: Payer: Self-pay | Admitting: Nurse Practitioner

## 2025-01-19 DIAGNOSIS — N951 Menopausal and female climacteric states: Secondary | ICD-10-CM

## 2025-01-19 NOTE — Telephone Encounter (Signed)
 Rx 12/09/24 4 patch 1RF- too soon Requested Prescriptions  Pending Prescriptions Disp Refills   cloNIDine  (CATAPRES  - DOSED IN MG/24 HR) 0.1 mg/24hr patch [Pharmacy Med Name: CLONIDINE   0.1MG   PATCH  24 HR]  5    Sig: APPLY 1 PATCH TOPICALLY TO SKIN  ONCE WEEKLY     Cardiovascular:  Alpha-2 Agonists Failed - 01/19/2025  4:19 PM      Failed - Last BP in normal range    BP Readings from Last 1 Encounters:  10/20/24 (!) 148/90         Passed - Last Heart Rate in normal range    Pulse Readings from Last 1 Encounters:  10/20/24 70         Passed - Valid encounter within last 6 months    Recent Outpatient Visits           3 months ago Essential hypertension, benign   [facility] Gareth Mliss FALCON, FNP   5 months ago Annual physical exam   [facility] Gareth Mliss FALCON, FNP   8 months ago Obesity (BMI 35.0-39.9 without comorbidity)   [facility] Gareth Mliss FALCON, FNP   11 months ago Obesity (BMI 35.0-39.9 without comorbidity)   [facility] Gareth Mliss FALCON, FNP       Future Appointments             In 7 months Gareth, Mliss FALCON, FNP [facility] [place]

## 2025-01-20 ENCOUNTER — Encounter: Payer: Self-pay | Admitting: Nurse Practitioner

## 2025-01-20 ENCOUNTER — Ambulatory Visit (INDEPENDENT_AMBULATORY_CARE_PROVIDER_SITE_OTHER): Admitting: Nurse Practitioner

## 2025-01-20 VITALS — BP 116/82 | HR 73 | Temp 98.4°F | Ht 69.0 in | Wt 199.0 lb

## 2025-01-20 DIAGNOSIS — I517 Cardiomegaly: Secondary | ICD-10-CM

## 2025-01-20 DIAGNOSIS — F411 Generalized anxiety disorder: Secondary | ICD-10-CM

## 2025-01-20 DIAGNOSIS — G4733 Obstructive sleep apnea (adult) (pediatric): Secondary | ICD-10-CM

## 2025-01-20 DIAGNOSIS — E66811 Obesity, class 1: Secondary | ICD-10-CM

## 2025-01-20 DIAGNOSIS — I10 Essential (primary) hypertension: Secondary | ICD-10-CM | POA: Diagnosis not present

## 2025-01-20 DIAGNOSIS — E782 Mixed hyperlipidemia: Secondary | ICD-10-CM | POA: Diagnosis not present

## 2025-01-20 DIAGNOSIS — F3341 Major depressive disorder, recurrent, in partial remission: Secondary | ICD-10-CM

## 2025-01-20 DIAGNOSIS — E038 Other specified hypothyroidism: Secondary | ICD-10-CM | POA: Diagnosis not present

## 2025-01-20 DIAGNOSIS — Z131 Encounter for screening for diabetes mellitus: Secondary | ICD-10-CM

## 2025-01-20 MED ORDER — BUPROPION HCL ER (XL) 300 MG PO TB24: ORAL_TABLET | ORAL | 3 refills | Status: AC

## 2025-01-20 NOTE — Progress Notes (Signed)
 "  BP 116/82   Pulse 73   Temp 98.4 F (36.9 C)   Ht 5' 9 (1.753 m)   Wt 199 lb (90.3 kg)   SpO2 99%   BMI 29.39 kg/m    Subjective:    Patient ID: Natalie Petersen, female    DOB: Sep 07, 1976, 49 y.o.   MRN: [id number]  HPI: Natalie Petersen is a 49 y.o. female  Chief Complaint  Patient presents with   Medical Management of Chronic Issues    Pt c/o about clonidine  patch leaving rash on skin.    Discussed the use of AI scribe software for clinical note transcription with the patient, who gave verbal consent to proceed.  History of Present Illness Natalie Petersen is a 49 year old female who presents for a three-month follow-up.  Perimenopausal vasomotor symptoms and clonidine  patch reaction - Uses clonidine  patch for vasomotor symptoms related to perimenopause - Develops a rash at the application site lasting approximately 1.5 weeks after each use - Rash is not associated with redness or pruritus  Depression and anxiety - Takes Wellbutrin  450 mg daily for depression and anxiety - Finds Wellbutrin  effective and does not wish to discontinue  Obesity and weight management - Currently takes Zepbound  12.5 mg weekly for obesity - Weight decreased from 233 pounds in May 2025 to 199 pounds currently - Waist circumference reduced from 42 inches to 38.5 inches, with a goal of under 35 inches - Attends Pilates classes two to three times per week - Stopped consuming alcohol  as part of weight management regimen - Goal weight is 175 pounds  Thyroid  function - Takes levothyroxine  75 mcg daily - Most recent TSH within normal range  Hypertension - Takes hydrochlorothiazide  25 mg daily  Glycemic control - Most recent A1c was 4.9  Hyperlipidemia - Most recent LDL was 116  Recurrent sinus infections and allergic symptoms - History of recurrent sinus infections - Takes Allegra daily for prevention, which is effective - Uses Mucinex and Flonase  as needed     Wt Readings from Last 3  Encounters:  01/20/25 199 lb (90.3 kg)  10/20/24 215 lb (97.5 kg)  08/16/24 221 lb 12.8 oz (100.6 kg)   Body mass index is 29.39 kg/m.  Flowsheet Row Office Visit from 01/20/2025 in [facility] Office Visit from 10/20/2024 in [facility] Office Visit from 08/16/2024 in [facility]  1 38.5 inches 42 inches 41.5 inches        01/20/2025   10:52 AM 08/16/2024    8:46 AM 11/17/2023    7:40 AM  Depression screen PHQ 2/9  Decreased Interest 0 0 0  Down, Depressed, Hopeless 0 0 0  PHQ - 2 Score 0 0 0  Altered sleeping 0 0   Tired, decreased energy 0 1   Change in appetite 0 0   Feeling bad or failure about yourself  0 0   Trouble concentrating 0 0   Moving slowly or fidgety/restless 0 0   Suicidal thoughts 0 0   PHQ-9 Score 0 1    Difficult doing work/chores  Not difficult at all      Data saved with a previous flowsheet row definition       08/16/2024    8:47 AM 08/14/2023   11:17 AM 08/08/2022    9:10 AM 02/10/2022    9:25 AM  GAD 7 : Generalized Anxiety Score  Nervous, Anxious, on Edge 2  1  0  0   Control/stop worrying 0  0  0  0   Worry too much - different things 0  1  0  0   Trouble relaxing 2  0  0  0   Restless 1  0  0  0   Easily annoyed or irritable 2  0  0  0   Afraid - awful might happen 0  0  0  0   Total GAD 7 Score 7 2 0 0  Anxiety Difficulty Not difficult at all Not difficult at all Not difficult at all Not difficult at all     Data saved with a previous flowsheet row definition     Relevant past medical, surgical, family and social history reviewed and updated as indicated. Interim medical history since our last visit reviewed. Allergies and medications reviewed and updated.  Review of Systems  Constitutional: Negative for fever or weight change.  Respiratory: Negative for cough and shortness of breath.   Cardiovascular: Negative for chest pain or palpitations.   Gastrointestinal: Negative for abdominal pain, no bowel changes.  Musculoskeletal: Negative for gait problem or joint swelling.  Skin: Negative for rash.  Neurological: Negative for dizziness or headache.  No other specific complaints in a complete review of systems (except as listed in HPI above).      Objective:      BP 116/82   Pulse 73   Temp 98.4 F (36.9 C)   Ht 5' 9 (1.753 m)   Wt 199 lb (90.3 kg)   SpO2 99%   BMI 29.39 kg/m    Wt Readings from Last 3 Encounters:  01/20/25 199 lb (90.3 kg)  10/20/24 215 lb (97.5 kg)  08/16/24 221 lb 12.8 oz (100.6 kg)    Physical Exam VITALS: BP- 116/82 MEASUREMENTS: Weight- 199. GENERAL: Alert, cooperative, well developed, no acute distress HEENT: Normocephalic, normal oropharynx, moist mucous membranes CHEST: Clear to auscultation bilaterally, no wheezes, rhonchi, or crackles CARDIOVASCULAR: Normal heart rate and rhythm, S1 and S2 normal without murmurs ABDOMEN: Soft, non-tender, non-distended, without organomegaly, normal bowel sounds EXTREMITIES: No cyanosis or edema NEUROLOGICAL: Cranial nerves grossly intact, moves all extremities without gross motor or sensory deficit  Results for orders placed or performed in visit on 08/16/24  CBC with Differential/Platelet   Collection Time: 08/16/24  9:34 AM  Result Value Ref Range   WBC 6.5 3.8 - 10.8 Thousand/uL   RBC 5.07 3.80 - 5.10 Million/uL   Hemoglobin 14.6 11.7 - 15.5 g/dL   HCT 55.5 64.9 - 54.9 %   MCV 87.6 80.0 - 100.0 fL   MCH 28.8 27.0 - 33.0 pg   MCHC 32.9 32.0 - 36.0 g/dL   RDW 86.6 88.9 - 84.9 %   Platelets 351 140 - 400 Thousand/uL   MPV 10.0 7.5 - 12.5 fL   Neutro Abs 4,206 1,500 - 7,800 cells/uL   Absolute Lymphocytes 1,814 850 - 3,900 cells/uL   Absolute Monocytes 384 200 - 950 cells/uL   Eosinophils Absolute 39 15 - 500 cells/uL   Basophils Absolute 59 0 - 200 cells/uL   Neutrophils Relative % 64.7 %   Total Lymphocyte 27.9 %   Monocytes Relative 5.9 %    Eosinophils Relative 0.6 %   Basophils Relative 0.9 %  Comprehensive metabolic panel with GFR   Collection Time: 08/16/24  9:34 AM  Result Value Ref Range   Glucose, Bld 84 65 - 99 mg/dL   BUN 13 7 -  25 mg/dL   Creat 9.13 9.49 - 9.00 mg/dL   eGFR 83 > OR = 60 fO/fpw/8.26f7   BUN/Creatinine Ratio SEE NOTE: 6 - 22 (calc)   Sodium 136 135 - 146 mmol/L   Potassium 4.5 3.5 - 5.3 mmol/L   Chloride 101 98 - 110 mmol/L   CO2 26 20 - 32 mmol/L   Calcium 9.4 8.6 - 10.2 mg/dL   Total Protein 6.8 6.1 - 8.1 g/dL   Albumin 4.3 3.6 - 5.1 g/dL   Globulin 2.5 1.9 - 3.7 g/dL (calc)   AG Ratio 1.7 1.0 - 2.5 (calc)   Total Bilirubin 0.6 0.2 - 1.2 mg/dL   Alkaline phosphatase (APISO) 49 31 - 125 U/L   AST 11 10 - 35 U/L   ALT 9 6 - 29 U/L  Lipid panel   Collection Time: 08/16/24  9:34 AM  Result Value Ref Range   Cholesterol 199 <200 mg/dL   HDL 68 > OR = 50 mg/dL   Triglycerides 59 <849 mg/dL   LDL Cholesterol (Calc) 116 (H) mg/dL (calc)   Total CHOL/HDL Ratio 2.9 <5.0 (calc)   Non-HDL Cholesterol (Calc) 131 (H) <130 mg/dL (calc)  Hemoglobin J8r   Collection Time: 08/16/24  9:34 AM  Result Value Ref Range   Hgb A1c MFr Bld 4.9 <5.7 %   Mean Plasma Glucose 94 mg/dL   eAG (mmol/L) 5.2 mmol/L  TSH   Collection Time: 08/16/24  9:34 AM  Result Value Ref Range   TSH 2.72 mIU/L  Cologuard   Collection Time: 08/31/24  6:49 AM  Result Value Ref Range   COLOGUARD Negative Negative          Assessment & Plan:   Problem List Items Addressed This Visit       Cardiovascular and Mediastinum   Essential hypertension, benign - Primary (Chronic)   Relevant Orders   Comprehensive metabolic panel with GFR   CBC with Differential/Platelet   LVH (left ventricular hypertrophy) (Chronic)     Respiratory   OSA (obstructive sleep apnea)     Endocrine   Subclinical hypothyroidism   Relevant Orders   TSH     Other   Obesity, Class I, BMI 30.0-34.9 (see actual BMI)   MDD (major depressive  disorder), recurrent, in partial remission   Relevant Medications   buPROPion  (WELLBUTRIN  XL) 300 MG 24 hr tablet   GAD (generalized anxiety disorder)   Relevant Medications   buPROPion  (WELLBUTRIN  XL) 300 MG 24 hr tablet   Mixed hyperlipidemia   Relevant Orders   Lipid panel   Other Visit Diagnoses       Screening for diabetes mellitus       Relevant Orders   Comprehensive metabolic panel with GFR   Hemoglobin A1c        Assessment and Plan Assessment & Plan Obesity Significant weight loss from 233 pounds in May 2025 to 199 pounds currently. Waist circumference reduced from 42 inches to 38 inches. Currently in the overweight category. Engaging in Pilates 2-3 times a week. Goal weight is 175 pounds. Discussed potential weight regain if medication is discontinued. Advised to stay on current GLP-1 injection until further information is available regarding the new pill form. - Continue Tirzepatide  (Zepbound ) 12.5 mg subcutaneous once a week. - Continue Pilates 2-3 times a week. - Monitor weight and waist circumference. - Discussed potential weight regain if medication is discontinued.  Essential hypertension Blood pressure well-controlled at 116/82 mmHg. Current medication regimen includes hydrochlorothiazide  and clonidine   patch. Discussed potential rash from clonidine  patch and suggested using Flonase  before application to mitigate rash. - Continue Hydrochlorothiazide  25 mg oral daily. - Continue Clonidine  0.1 mg/24hr transdermal once a week. - Try applying Flonase  before clonidine  patch to reduce rash.  Left ventricular hypertrophy No specific discussion or changes in management during this visit.  Subclinical hypothyroidism TSH levels within normal range as of August 2025. Currently on levothyroxine  75 mcg daily. Discussed potential reduction of levothyroxine  dose based on future lab results. - Continue Levothyroxine  75 mcg oral daily.  Mixed hyperlipidemia LDL improved to  116 mg/dL. Current management appears effective.  Major depressive disorder, recurrent, in partial remission Mental health stable on current Wellbutrin  dose of 450 mg daily. No desire to increase dose or change medication. - Continue Bupropion  (Wellbutrin  XL) 450 mg oral daily.  Generalized anxiety disorder No specific discussion or changes in management during this visit.  Perimenopausal vasomotor symptoms Experiencing rash from clonidine  patch used for vasomotor symptoms. Discussed alternative application method with Flonase  to reduce rash. - Try applying Flonase  before clonidine  patch to reduce rash.  General health maintenance Discussed sinus infection prevention with Allegra and Mucinex. Encouraged continued use of Flonase  for sinus health. - Continue Allegra daily for sinus infection prevention. - Keep Mucinex 1200 mg available for sinus congestion. - Keep Flonase  available for sinus health.        Follow up plan: Return in about 6 months (around 07/20/2025) for follow up. "

## 2025-01-21 LAB — COMPREHENSIVE METABOLIC PANEL WITH GFR
AG Ratio: 2 (calc) (ref 1.0–2.5)
ALT: 17 U/L (ref 6–29)
AST: 18 U/L (ref 10–35)
Albumin: 4.9 g/dL (ref 3.6–5.1)
Alkaline phosphatase (APISO): 51 U/L (ref 31–125)
BUN: 12 mg/dL (ref 7–25)
CO2: 28 mmol/L (ref 20–32)
Calcium: 10.1 mg/dL (ref 8.6–10.2)
Chloride: 99 mmol/L (ref 98–110)
Creat: 0.88 mg/dL (ref 0.50–0.99)
Globulin: 2.4 g/dL (ref 1.9–3.7)
Glucose, Bld: 90 mg/dL (ref 65–99)
Potassium: 3.6 mmol/L (ref 3.5–5.3)
Sodium: 137 mmol/L (ref 135–146)
Total Bilirubin: 0.7 mg/dL (ref 0.2–1.2)
Total Protein: 7.3 g/dL (ref 6.1–8.1)
eGFR: 81 mL/min/{1.73_m2}

## 2025-01-21 LAB — CBC WITH DIFFERENTIAL/PLATELET
Absolute Lymphocytes: 2513 {cells}/uL (ref 850–3900)
Absolute Monocytes: 473 {cells}/uL (ref 200–950)
Basophils Absolute: 60 {cells}/uL (ref 0–200)
Basophils Relative: 0.8 %
Eosinophils Absolute: 60 {cells}/uL (ref 15–500)
Eosinophils Relative: 0.8 %
HCT: 47.4 % — ABNORMAL HIGH (ref 35.9–46.0)
Hemoglobin: 16 g/dL — ABNORMAL HIGH (ref 11.7–15.5)
MCH: 29.3 pg (ref 27.0–33.0)
MCHC: 33.8 g/dL (ref 31.6–35.4)
MCV: 86.8 fL (ref 81.4–101.7)
MPV: 9.6 fL (ref 7.5–12.5)
Monocytes Relative: 6.3 %
Neutro Abs: 4395 {cells}/uL (ref 1500–7800)
Neutrophils Relative %: 58.6 %
Platelets: 469 10*3/uL — ABNORMAL HIGH (ref 140–400)
RBC: 5.46 Million/uL — ABNORMAL HIGH (ref 3.80–5.10)
RDW: 12.7 % (ref 11.0–15.0)
Total Lymphocyte: 33.5 %
WBC: 7.5 10*3/uL (ref 3.8–10.8)

## 2025-01-21 LAB — LIPID PANEL
Cholesterol: 245 mg/dL — ABNORMAL HIGH
HDL: 73 mg/dL
LDL Cholesterol (Calc): 157 mg/dL — ABNORMAL HIGH
Non-HDL Cholesterol (Calc): 172 mg/dL — ABNORMAL HIGH
Total CHOL/HDL Ratio: 3.4 (calc)
Triglycerides: 59 mg/dL

## 2025-01-21 LAB — HEMOGLOBIN A1C
Hgb A1c MFr Bld: 4.8 %
Mean Plasma Glucose: 91 mg/dL
eAG (mmol/L): 5 mmol/L

## 2025-01-21 LAB — TSH: TSH: 2.37 m[IU]/L

## 2025-01-23 ENCOUNTER — Ambulatory Visit: Payer: Self-pay | Admitting: Nurse Practitioner

## 2025-01-23 DIAGNOSIS — E038 Other specified hypothyroidism: Secondary | ICD-10-CM

## 2025-01-23 DIAGNOSIS — D75839 Thrombocytosis, unspecified: Secondary | ICD-10-CM

## 2025-07-20 ENCOUNTER — Ambulatory Visit: Admitting: Nurse Practitioner

## 2025-08-18 ENCOUNTER — Encounter: Admitting: Nurse Practitioner
